# Patient Record
Sex: Male | Born: 1955 | Race: White | Hispanic: No | Marital: Single | Smoking: Former smoker
Health system: Southern US, Community
[De-identification: ages and names within clinical notes are randomized; demographics above are authoritative.]

## PROBLEM LIST (undated history)

## (undated) DIAGNOSIS — J449 Chronic obstructive pulmonary disease, unspecified: Secondary | ICD-10-CM

## (undated) HISTORY — PX: KIDNEY SURGERY: SHX687

---

## 2006-04-25 ENCOUNTER — Other Ambulatory Visit: Payer: Self-pay

## 2006-04-25 ENCOUNTER — Emergency Department: Payer: Self-pay

## 2006-04-27 ENCOUNTER — Other Ambulatory Visit: Payer: Self-pay

## 2006-04-27 ENCOUNTER — Inpatient Hospital Stay: Payer: Self-pay | Admitting: Internal Medicine

## 2006-05-02 ENCOUNTER — Emergency Department: Payer: Self-pay | Admitting: Emergency Medicine

## 2006-05-02 ENCOUNTER — Other Ambulatory Visit: Payer: Self-pay

## 2007-02-25 ENCOUNTER — Emergency Department: Payer: Self-pay | Admitting: Internal Medicine

## 2007-03-25 ENCOUNTER — Other Ambulatory Visit: Payer: Self-pay

## 2007-03-25 ENCOUNTER — Emergency Department: Payer: Self-pay | Admitting: Emergency Medicine

## 2007-12-20 ENCOUNTER — Emergency Department: Payer: Self-pay | Admitting: Internal Medicine

## 2008-04-28 IMAGING — CT CT CHEST W/ CM
1 series · 16 of 33 positions shown, 20 images · IV contrast (APPLIED)
Comparison: none

REASON FOR EXAM: SOB, syncope, tachycardia and hypoxia; [HOSPITAL]
COMMENTS:

PROCEDURE:     CT  - CT CHEST (FOR PE) W  - April 27, 2006  [DATE]
RESULT:
TECHNIQUE: Axial images were obtained from the apices to the hemidiaphragms
post intravenous injection of contrast material.  Both mediastinal and lung
window settings were obtained.

[Series 5: soft tissue · axial · 0.74mm/px · z∈[-221,+58]mm · 16 of 101 slices shown, 20 images]
[im 4/101  mediastinal]
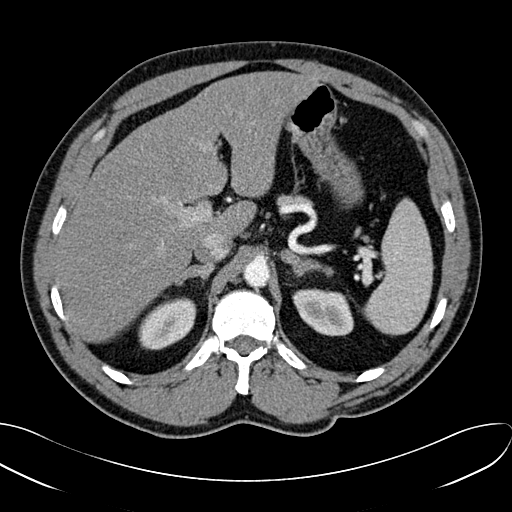
[im 4/101  lung]
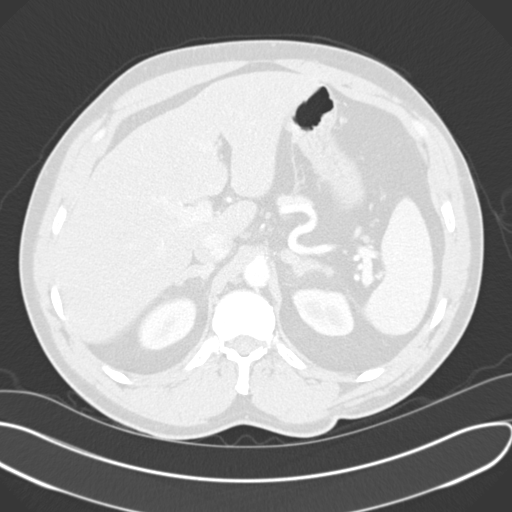
[im 12/101  lung]
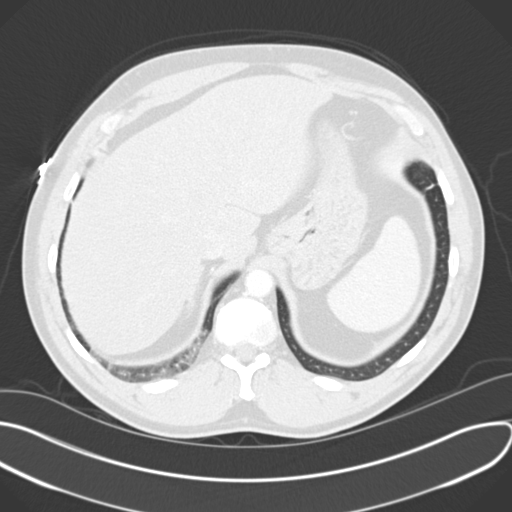
[im 19/101  lung]
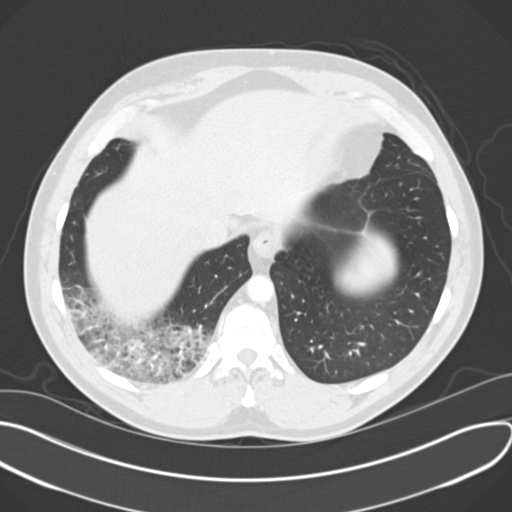
[im 23/101  lung]
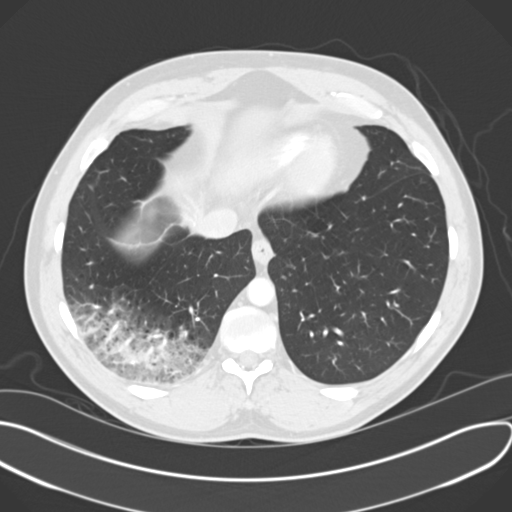
[im 30/101  mediastinal]
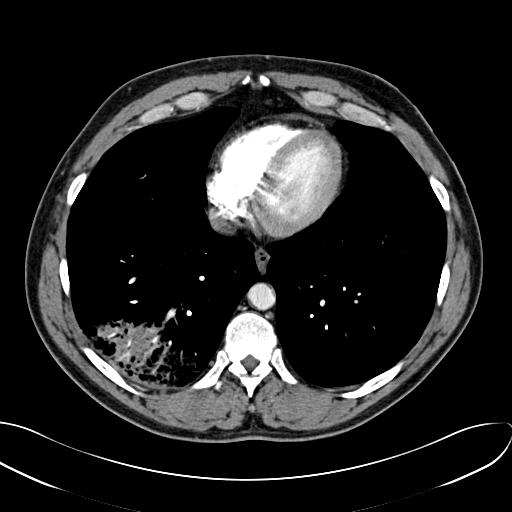
[im 30/101  lung]
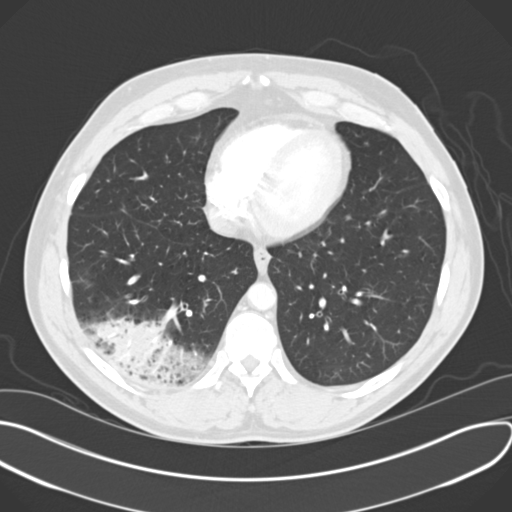
[im 38/101  lung]
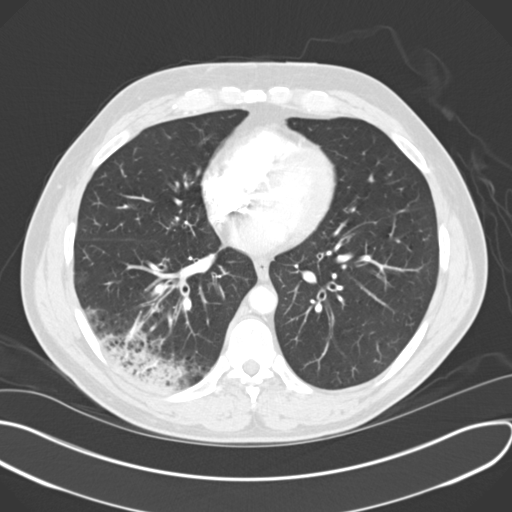
[im 45/101  lung]
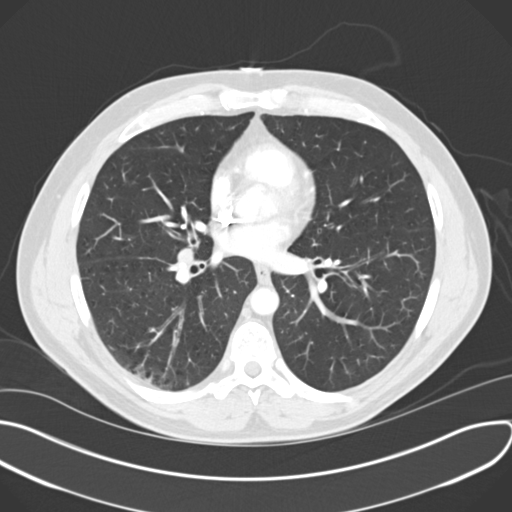
[im 49/101  lung]
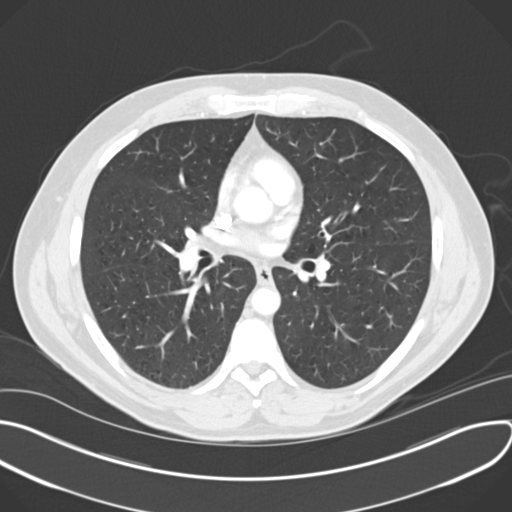
[im 54/101  mediastinal]
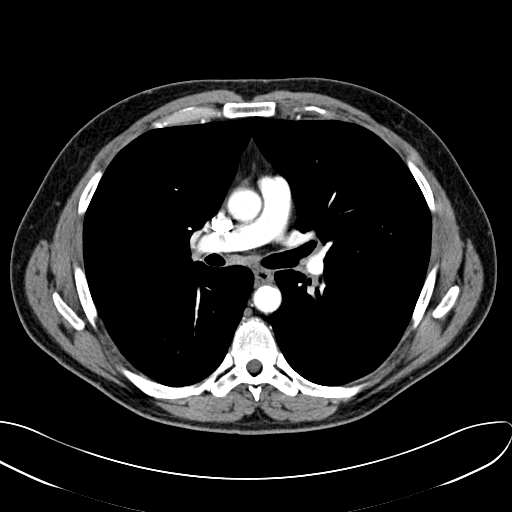
[im 54/101  lung]
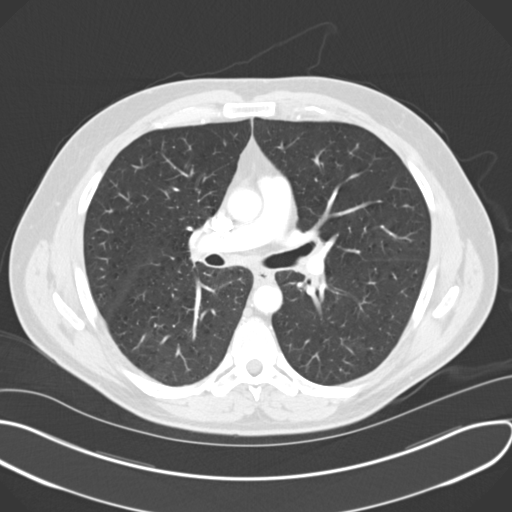
[im 60/101  lung]
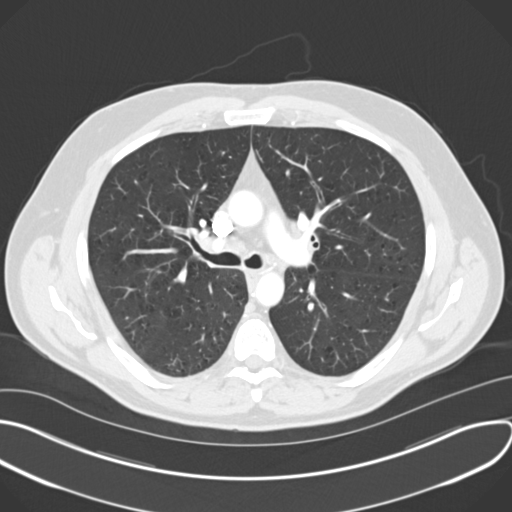
[im 63/101  lung]
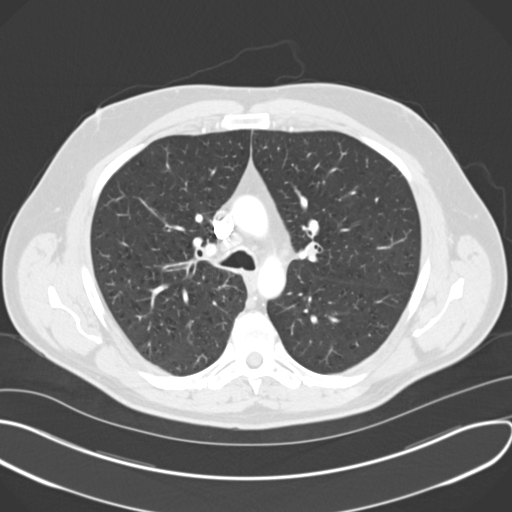
[im 71/101  lung]
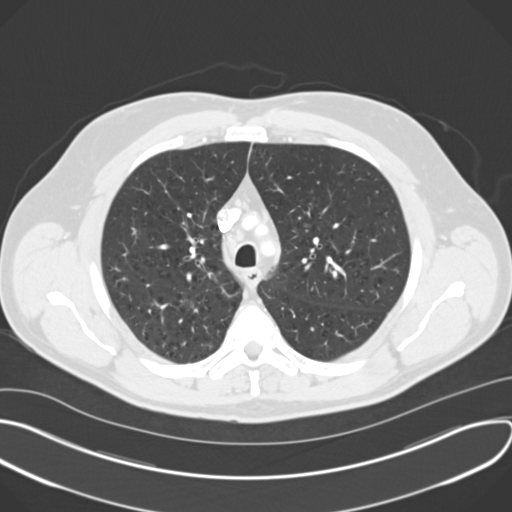
[im 78/101  mediastinal]
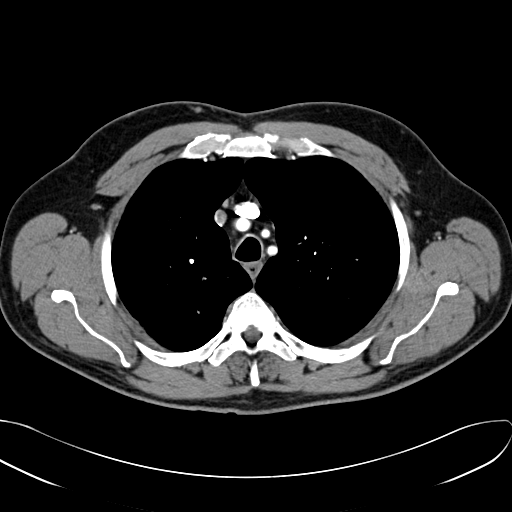
[im 78/101  lung]
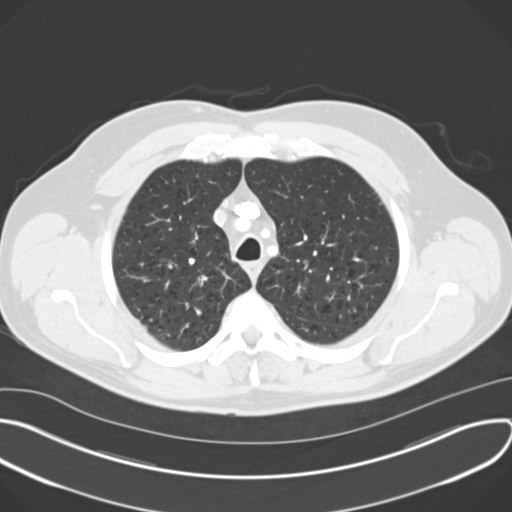
[im 82/101  lung]
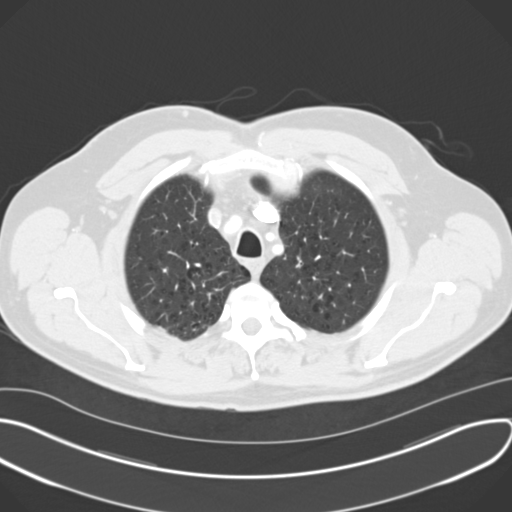
[im 89/101  lung]
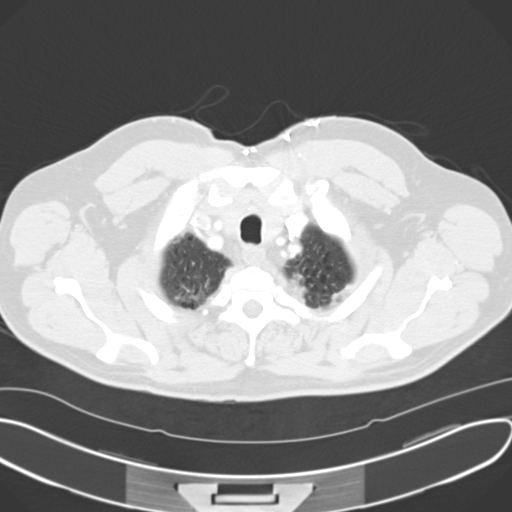
[im 97/101  lung]
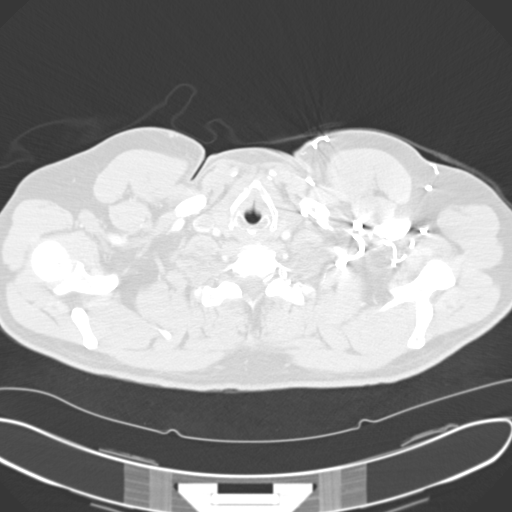

[16 of 33 positions shown; findings below may reference images not displayed]

FINDINGS: On the mediastinal window settings, there is some mediastinal
lymph nodes as well as RIGHT hilar lymph node and lymph node along the
descending artery on the RIGHT.  No filling defects are noted within the
pulmonary artery to suggest pulmonary emboli.

On the lung window settings, there is noted alveolar density with
consolidation in the RIGHT lower lobe posteriorly which can be compatible
with RIGHT lower lobe pneumonia.  No effusions are noted.
IMPRESSION: Findings which can be consistent with a RIGHT lower lobe pneumonia. Some
reactive lymph nodes noted in the RIGHT hilar and infrahilar region.

The report was called to the Emergency Room at the conclusion of the study.

## 2008-11-05 ENCOUNTER — Emergency Department: Payer: Self-pay | Admitting: Emergency Medicine

## 2011-08-30 ENCOUNTER — Emergency Department: Payer: Self-pay | Admitting: *Deleted

## 2012-12-11 ENCOUNTER — Inpatient Hospital Stay: Payer: Self-pay | Admitting: Internal Medicine

## 2012-12-11 LAB — COMPREHENSIVE METABOLIC PANEL
Albumin: 3.5 g/dL (ref 3.4–5.0)
BUN: 9 mg/dL (ref 7–18)
Calcium, Total: 9.5 mg/dL (ref 8.5–10.1)
Creatinine: 0.98 mg/dL (ref 0.60–1.30)
EGFR (African American): >60
Glucose: 158 mg/dL — ABNORMAL HIGH (ref 65–99)
Osmolality: 255 (ref 275–301)
SGOT(AST): 38 U/L — ABNORMAL HIGH (ref 15–37)
Sodium: 126 mmol/L — ABNORMAL LOW (ref 136–145)
Total Protein: 8.4 g/dL — ABNORMAL HIGH (ref 6.4–8.2)

## 2012-12-11 LAB — CBC
HCT: 43.8 % (ref 40.0–52.0)
HGB: 15.4 g/dL (ref 13.0–18.0)
MCH: 31.1 pg (ref 26.0–34.0)
MCHC: 35.2 g/dL (ref 32.0–36.0)
Platelet: 301 10*3/uL (ref 150–440)
RBC: 4.96 10*6/uL (ref 4.40–5.90)
RDW: 13.6 % (ref 11.5–14.5)

## 2012-12-11 LAB — CK TOTAL AND CKMB (NOT AT ARMC): CK-MB: 13.5 ng/mL — ABNORMAL HIGH (ref 0.5–3.6)

## 2012-12-12 LAB — CBC WITH DIFFERENTIAL/PLATELET
Basophil #: 0 10*3/uL (ref 0.0–0.1)
Basophil %: 0.2 %
Eosinophil %: 0 %
HCT: 41.8 % (ref 40.0–52.0)
HGB: 14.6 g/dL (ref 13.0–18.0)
Lymphocyte %: 6 %
MCH: 31.1 pg (ref 26.0–34.0)
MCV: 89 fL (ref 80–100)
Monocyte #: 1 x10 3/mm (ref 0.2–1.0)
Monocyte %: 5.1 %
Neutrophil #: 17.3 10*3/uL — ABNORMAL HIGH (ref 1.4–6.5)
Platelet: 302 10*3/uL (ref 150–440)
RDW: 13.5 % (ref 11.5–14.5)

## 2012-12-12 LAB — BASIC METABOLIC PANEL
Anion Gap: 7 (ref 7–16)
BUN: 19 mg/dL — ABNORMAL HIGH (ref 7–18)
Calcium, Total: 9.9 mg/dL (ref 8.5–10.1)
Chloride: 100 mmol/L (ref 98–107)
Co2: 28 mmol/L (ref 21–32)
Creatinine: 1.14 mg/dL (ref 0.60–1.30)
EGFR (African American): >60
Osmolality: 279 (ref 275–301)
Potassium: 4 mmol/L (ref 3.5–5.1)
Sodium: 135 mmol/L — ABNORMAL LOW (ref 136–145)

## 2012-12-13 LAB — CK: CK, Total: 162 U/L (ref 35–232)

## 2012-12-13 LAB — TROPONIN I: Troponin-I: <0.02 ng/mL

## 2012-12-15 LAB — CBC WITH DIFFERENTIAL/PLATELET
HGB: 14.4 g/dL (ref 13.0–18.0)
Lymphocytes: 11 %
MCH: 30.9 pg (ref 26.0–34.0)
MCHC: 34.1 g/dL (ref 32.0–36.0)
MCV: 91 fL (ref 80–100)
Monocytes: 11 %
Platelet: 333 10*3/uL (ref 150–440)
RBC: 4.67 10*6/uL (ref 4.40–5.90)
Segmented Neutrophils: 77 %
WBC: 18.3 10*3/uL — ABNORMAL HIGH (ref 3.8–10.6)

## 2012-12-16 LAB — CULTURE, BLOOD (SINGLE)

## 2012-12-16 LAB — CREATININE, SERUM
Creatinine: 1.31 mg/dL — ABNORMAL HIGH (ref 0.60–1.30)
EGFR (Non-African Amer.): 60 — ABNORMAL LOW

## 2014-06-26 NOTE — H&P (Signed)
PATIENT NAME:  Dustin Vazquez, Dustin Vazquez MR#:  [id number] DATE OF BIRTH:  May 07, 1955  DATE OF ADMISSION:  12/11/2012  PRIMARY CARE PHYSICIAN: At the [facility].   CHIEF COMPLAINT: Shortness of breath.   HISTORY OF PRESENT ILLNESS: This is a 59 year old male who presents to the hospital due to a 2 to 3 day history of progressive shortness of breath, cough with productive sputum and chills. The patient does have a history of underlying COPD with ongoing tobacco abuse. Presents to the hospital due to a worsening shortness of breath as mentioned. His cough was productive with green-yellow sputum. He admits to some chills, but no documented fever. No nausea, no vomiting. He does admit to some chest pain, but related to cough. The patient presented to the hospital, was noted to be hypoxic and noted to be in acute hypoxic respiratory failure secondary to COPD exacerbation. The patient was initially put on BiPAP, but could not tolerate it; therefore, currently he is on high flow nasal cannula and feels much better. Hospitalist services were contacted for further treatment and evaluation.   REVIEW OF SYSTEMS: CONSTITUTIONAL: No documented fever. Positive chills. No weight gain or weight loss.  EYES: No blurry or double vision.  ENT: No tinnitus. No postnasal drip. No redness of the oropharynx.  RESPIRATORY: Positive cough. Positive COPD, positive wheezing, positive dyspnea on exertion.  CARDIOVASCULAR: Positive chest pain. No orthopnea, no palpitations, no syncope.  GASTROINTESTINAL: No nausea, vomiting, diarrhea. No abdominal pain. No melena or hematochezia.  GENITOURINARY: No dysuria or hematuria.  ENDOCRINE: No polyuria or nocturia. No heat or cold intolerance. HEMATOLOGY:  No anemia. No bruising. No bleeding.  INTEGUMENTARY: No rashes. No lesions.  MUSCULOSKELETAL: No arthritis. No swelling. No gout.  NEUROLOGIC: No numbness or tingling. No ataxia. No seizure activity.  PSYCHIATRIC: No anxiety. No insomnia.  No ADD.   PAST MEDICAL HISTORY: Consistent with COPD with ongoing tobacco abuse,   ALLERGIES: No known drug allergies.   SOCIAL HISTORY: Does smoke about a pack per day, has been smoking for the past 40+ years. Occasional alcohol use with three beers daily. No illicit drug abuse. Lives by himself.   FAMILY HISTORY: Both mother and father are deceased. Mother died from complications of heart disease. Father died from lung disease as he used to work in a mill.    CURRENT MEDICATIONS: Symbicort 80/4.5,  one puff b.i.d., albuterol inhaler as needed.   PHYSICAL EXAMINATION:  VITAL SIGNS: On admission are noted to be: Temperature is 99.5, pulse 124, respirations 28, blood pressure 131/81, sats 95% on 30% high flow nasal cannula.  GENERAL: He is a pleasant-appearing male in mild to moderate respiratory distress.  HEAD, EYES, EARS, NOSE, THROAT:  The patient is atraumatic, normocephalic. Extraocular muscles are intact. Pupils are equal and reactive to light. Sclerae anicteric. No conjunctival injection. No pharyngeal erythema.  NECK: Supple. There is no jugular venous distention. No bruits, no lymphadenopathy or thyromegaly.  HEART: Regular rate and rhythm, tachycardic. No murmurs. No rubs. No clicks.  LUNGS: He has a prolonged inspiratory and expiratory phase, diffuse wheezing bilaterally. Positive use of accessory muscles. No dullness to percussion.  ABDOMEN: Soft, flat, nontender, nondistended. Has good bowel sounds. No hepatosplenomegaly appreciated.  EXTREMITIES: No evidence of any cyanosis, clubbing, or peripheral edema. Has +2 pedal and radial pulses bilaterally.  NEUROLOGICAL: The patient is alert, awake, and oriented x 3. No focal motor or sensory deficits appreciated bilaterally.  SKIN: Moist and warm with no rashes appreciated.  LYMPHATIC: There  is cervical or axillary lymphadenopathy.   LABORATORY DATA: Serum glucose of 158, BUN 9, creatinine 0.9, sodium 126, potassium 3.8, chloride 92,  bicarbonate 26. LFTs are within normal limits. CK  557 . Troponin less than 0.02. White cell count 20.6, hemoglobin 15.4, hematocrit 43.8, platelet count 301.  ABG showed a pH of 7.48, pCO2 of 31, pO2 of 89, sats 97%. The patient did have a chest x-ray done which showed hyperinflation secondary to COPD, but no other acute cardiopulmonary disease of the chest.   ASSESSMENT AND PLAN: This is a 59 year old male with a history of long-term tobacco abuse and chronic obstructive pulmonary disease who presents to the hospital with shortness of breath and wheezing; progressively getting worse over the past 2 to 3 days and noted to be in chronic obstructive pulmonary disease exacerbation.   1.  Chronic obstructive pulmonary disease exacerbation. This is likely secondary to ongoing tobacco abuse with possible underlying bronchitis/pneumonia. For now, I will start the patient on IV steroids, around-the-clock nebulizer treatments, continue his Symbicort, add some Spiriva also place him on empiric Levaquin, follow blood and sputum cultures. We will need to assess him for home oxygen prior to discharge. The patient was on BiPAP originally now is on high flow nasal cannula. Will wean the high flow as tolerated.  2.  Leukocytosis. I suspect this is secondary to the bronchitis/pneumonia. We will follow white cell count after treatment with intravenous with IV antibiotics.  3.  Tobacco abuse. I will place the patient on a nicotine patch.   CODE STATUS: The patient is a FULL CODE.   TIME SPENT: 50 minutes    ____________________________ Rolly PancakeVivek J. Cherlynn KaiserSainani, MD vjs:dp D: 12/11/2012 16:08:25 ET T: 12/11/2012 16:43:58 ET JOB#: [id number]  cc: Rolly PancakeVivek J. Cherlynn KaiserSainani, MD, <Dictator> Houston SirenVIVEK J Tatum Massman MD ELECTRONICALLY SIGNED 12/11/2012 19:20

## 2014-06-26 NOTE — Discharge Summary (Signed)
PATIENT NAME:  Dustin Vazquez, Dustin Vazquez MR#:  [id number] DATE OF BIRTH:  May 30, 1955  DATE OF ADMISSION:  12/11/2012 DATE OF DISCHARGE:  12/16/2012  PRIMARY CARE PHYSICIAN: Nonlocal.   DISCHARGE DIAGNOSIS: 1.  Acute respiratory failure due to chronic obstructive pulmonary disease exacerbation.  2.  Acute bronchitis.  3.  Tobacco abuse.   CONDITION: Stable.   CODE STATUS: Full code.   HOME MEDICATIONS: 1.  Proventil HFA CFC-free 90 mcg inhalation 2 puffs inhaled 4 times a day p.r.n. for shortness of breath.  2.  Symbicort 80 mcg/2.5 mcg inhalation aerosol 2 puffs inhaled twice a day.  3.  Spiriva 18 mcg inhalation capsule 1 cap once a day.  4.  Guaifenesin 100 mg per 5 mL oral liquid, 10 mL every 4 hours p.r.n.  5.  Prednisone 10 mg p.o. oral tablets, 4 tabs p.o. once a day for 2 days then taper.   DIET: Regular diet.   ACTIVITY: As tolerated.   FOLLOWUP CARE: Follow with PCP within 1 to 2 weeks. The patient will need smoking cessation. Smoking cessation was counseled, the patient has a nicotine patch at home.   REASON FOR ADMISSION: Shortness of breath.   HOSPITAL COURSE:   1.  The patient is a 59 year old gentleman with a history of COPD, tobacco abuse, presented to the ED with cough, sputum, shortness of breath. The patient was initially put on BiPAP but could not tolerate, then was on high-flow nasal cannula oxygen. For detailed history and physical examination, please refer to the admission note dictated by Dr. Cherlynn KaiserSainani. The patient's ABG on admission showed pH 7.48, pCO2 of 31, pO2 of 89, O2 saturation 97%. Patient's chest x-ray showed hyperventilation secondary to COPD but no acute cardiopulmonary disease. After admission, the patient has been treated with oxygen by nasal cannula, IV Solu-Medrol, Symbicort, Spiriva and  DuoNebs. The patient continues to have wheezing for the past 4 days, however, the patient's symptoms have much improved today. The patient only has mild cough, mild  wheezing, no shortness of breath.  2.  Leukocytosis which is possibly due to steroid. The patient's white count was 20.6 and decreased to 18.3.   The patient's vital signs are stable. He is clinically stable, will be off oxygen. The patient will be discharged to home today. I discussed the patient's discharge plan with the patient, nurse and case manager.   TIME SPENT: About 36 minutes.   ____________________________ Shaune PollackQing Santo Zahradnik, MD qc:cs Vazquez: 12/16/2012 18:03:00 ET T: 12/16/2012 19:48:25 ET JOB#: [id number]  cc: Shaune PollackQing Daxtyn Rottenberg, MD, <Dictator> Shaune PollackQING Tor Tsuda MD ELECTRONICALLY SIGNED 12/19/2012 10:15

## 2015-05-28 ENCOUNTER — Encounter: Payer: Self-pay | Admitting: *Deleted

## 2015-05-28 ENCOUNTER — Emergency Department
Admission: EM | Admit: 2015-05-28 | Discharge: 2015-05-28 | Discharge status: Against Medical Advice | Payer: Medicaid Other | Attending: Emergency Medicine | Admitting: Emergency Medicine

## 2015-05-28 ENCOUNTER — Emergency Department: Payer: Medicaid Other

## 2015-05-28 DIAGNOSIS — J441 Chronic obstructive pulmonary disease with (acute) exacerbation: Secondary | ICD-10-CM | POA: Diagnosis not present

## 2015-05-28 DIAGNOSIS — M542 Cervicalgia: Secondary | ICD-10-CM | POA: Insufficient documentation

## 2015-05-28 DIAGNOSIS — M25511 Pain in right shoulder: Secondary | ICD-10-CM | POA: Diagnosis not present

## 2015-05-28 DIAGNOSIS — M546 Pain in thoracic spine: Secondary | ICD-10-CM | POA: Diagnosis not present

## 2015-05-28 DIAGNOSIS — R7989 Other specified abnormal findings of blood chemistry: Secondary | ICD-10-CM | POA: Diagnosis not present

## 2015-05-28 DIAGNOSIS — R0602 Shortness of breath: Secondary | ICD-10-CM | POA: Diagnosis present

## 2015-05-28 DIAGNOSIS — R778 Other specified abnormalities of plasma proteins: Secondary | ICD-10-CM

## 2015-05-28 DIAGNOSIS — J471 Bronchiectasis with (acute) exacerbation: Secondary | ICD-10-CM

## 2015-05-28 HISTORY — DX: Chronic obstructive pulmonary disease, unspecified: J44.9

## 2015-05-28 LAB — BASIC METABOLIC PANEL
Anion gap: 11 (ref 5–15)
BUN: 11 mg/dL (ref 6–20)
CALCIUM: 8.8 mg/dL — AB (ref 8.9–10.3)
CO2: 19 mmol/L — AB (ref 22–32)
CREATININE: 0.92 mg/dL (ref 0.61–1.24)
Chloride: 103 mmol/L (ref 101–111)
GFR calc non Af Amer: >60 mL/min (ref >60)
GLUCOSE: 111 mg/dL — AB (ref 65–99)
Potassium: 3.9 mmol/L (ref 3.5–5.1)
Sodium: 133 mmol/L — ABNORMAL LOW (ref 135–145)

## 2015-05-28 LAB — CBC
HCT: 46 % (ref 40.0–52.0)
Hemoglobin: 15.5 g/dL (ref 13.0–18.0)
MCH: 31.5 pg (ref 26.0–34.0)
MCHC: 33.8 g/dL (ref 32.0–36.0)
MCV: 93.1 fL (ref 80.0–100.0)
PLATELETS: 260 10*3/uL (ref 150–440)
RBC: 4.94 MIL/uL (ref 4.40–5.90)
RDW: 13.7 % (ref 11.5–14.5)
WBC: 13.7 10*3/uL — ABNORMAL HIGH (ref 3.8–10.6)

## 2015-05-28 LAB — TROPONIN I: TROPONIN I: 0.08 ng/mL — AB (ref <0.031)

## 2015-05-28 MED ORDER — LEVOFLOXACIN IN D5W 750 MG/150ML IV SOLN: 750.0000 mg | Freq: Once | INTRAVENOUS | Status: DC

## 2015-05-28 MED ORDER — LEVOFLOXACIN 750 MG PO TABS
750.0000 mg | ORAL_TABLET | Freq: Once | ORAL | Status: AC
  Administered 2015-05-28: 750 mg via ORAL
  Filled 2015-05-28: qty 1

## 2015-05-28 MED ORDER — PREDNISONE 10 MG (21) PO TBPK: 10.0000 mg | ORAL_TABLET | Freq: Every day | ORAL | Status: DC

## 2015-05-28 MED ORDER — IOPAMIDOL (ISOVUE-300) INJECTION 61%
75.0000 mL | Freq: Once | INTRAVENOUS | Status: AC | PRN
  Administered 2015-05-28: 75 mL via INTRAVENOUS

## 2015-05-28 MED ORDER — ASPIRIN 81 MG PO CHEW
324.0000 mg | CHEWABLE_TABLET | Freq: Once | ORAL | Status: AC
  Administered 2015-05-28: 324 mg via ORAL
  Filled 2015-05-28: qty 4

## 2015-05-28 MED ORDER — LEVOFLOXACIN 500 MG PO TABS: 500.0000 mg | ORAL_TABLET | Freq: Every day | ORAL | Status: AC

## 2015-05-28 MED ORDER — HYDROCOD POLST-CPM POLST ER 10-8 MG/5ML PO SUER: 5.0000 mL | Freq: Two times a day (BID) | ORAL | Status: DC

## 2015-05-28 NOTE — ED Notes (Signed)
PAtient denies chest pain

## 2015-05-28 NOTE — ED Notes (Addendum)
Pt to ED via POV with cough/congestion, right shoulder and neck pain x 3-4 days. Pt states productive cough, along with SOB. Pt denies chest pain, vitals wnl, talking complete and full sentences. NAD noted.

## 2015-05-28 NOTE — Discharge Instructions (Signed)
Bronchiectasis Bronchiectasis is a condition in which the airways (bronchi) are damaged and widened. This makes it difficult for the lungs to get rid of mucus. As a result, mucus gathers in the airways, and this often leads to lung infections. Infection can cause inflammation in the airways, which may further weaken and damage the bronchi.  CAUSES  Bronchiectasis may be present at birth (congenital) or may develop later in life. Sometimes there is no apparent cause. Some common causes include:  Cystic fibrosis.   Recurrent lung infections (such as pneumonia, tuberculosis, or fungal infections).  Foreign bodies or other blockages in the lungs.  Breathing in fluid, food, or other foreign objects (aspiration). SIGNS AND SYMPTOMS  Common symptoms include:  A daily cough that brings up mucus and lasts for more than 3 weeks.  Frequent lung infections (such as pneumonia, tuberculosis, or fungal infections).  Shortness of breath and wheezing.   Weakness and fatigue. DIAGNOSIS  Various tests may be done to help diagnose bronchiectasis. Tests may include:  Chest X-rays or CT scans.   Breathing tests to help determine how your lungs are working.   Sputum cultures to check for infection.   Blood tests and other tests to check for related diseases or causes, such as cystic fibrosis. TREATMENT  Treatment varies depending on the severity of the condition. Medicines may be given to loosen the mucus to be coughed up (expectorants), to relax the muscles of the air passages (bronchodilators), or to prevent or treat infections (antibiotics). Physical therapy methods may be recommended to help clear mucus from the lungs. For severe cases, surgery may be done to remove the affected part of the lung. HOME CARE INSTRUCTIONS   Get plenty of rest.   Only take over-the-counter or prescription medicines as directed by your health care provider. If antibiotic medicines were prescribed, take them as  directed. Finish them even if you start to feel better.  Avoid sedatives and antihistamines unless otherwise directed by your health care provider. These medicines tend to thicken the mucus in the lungs.   Perform any breathing exercises or techniques to clear the lungs as directed by your health care provider.  Drink enough fluids to keep your urine clear or pale yellow.  Consider using a cold steam vaporizer or humidifier in your room or home to help loosen secretions.   If the cough is worse at night, try sleeping in a semi-upright position in a recliner or using a couple of pillows.   Avoid cigarette smoke and lung irritants. If you smoke, quit.  Stay inside when pollution and ozone levels are high.   Stay current with vaccinations and immunizations.   Follow up with your health care provider as directed.  SEEK MEDICAL CARE IF:  You cough up more thick, discolored mucus (sputum) that is yellow to green in color.  You have a fever or persistent symptoms for more than 2-3 days.  You cannot control your cough and are losing sleep. SEEK IMMEDIATE MEDICAL CARE IF:   You cough up blood.   You have chest pain or increasing shortness of breath.   You have pain that is getting worse or is uncontrolled with medicines.   You have a fever and your symptoms suddenly get worse. MAKE SURE YOU:  Understand these instructions.   Will watch your condition.   Will get help right away if you are not doing well or get worse.    This information is not intended to replace advice given to   you by your health care provider. Make sure you discuss any questions you have with your health care provider.   Document Released: 12/18/2006 Document Revised: 02/25/2013 Document Reviewed: 08/28/2012 Elsevier Interactive Patient Education 2016 [employer]  Chronic Obstructive Pulmonary Disease Exacerbation Chronic obstructive pulmonary disease (COPD) is a common lung condition in  which airflow from the lungs is limited. COPD is a general term that can be used to describe many different lung problems that limit airflow, including chronic bronchitis and emphysema. COPD exacerbations are episodes when breathing symptoms become much worse and require extra treatment. Without treatment, COPD exacerbations can be life threatening, and frequent COPD exacerbations can cause further damage to your lungs. CAUSES  Respiratory infections.  Exposure to smoke.  Exposure to air pollution, chemical fumes, or dust. Sometimes there is no apparent cause or trigger. RISK FACTORS  Smoking cigarettes.  Older age.  Frequent prior COPD exacerbations. SIGNS AND SYMPTOMS  Increased coughing.  Increased thick spit (sputum) production.  Increased wheezing.  Increased shortness of breath.  Rapid breathing.  Chest tightness. DIAGNOSIS Your medical history, a physical exam, and tests will help your health care provider make a diagnosis. Tests may include:  A chest X-ray.  Basic lab tests.  Sputum testing.  An arterial blood gas test. TREATMENT Depending on the severity of your COPD exacerbation, you may need to be admitted to a hospital for treatment. Some of the treatments commonly used to treat COPD exacerbations are:   Antibiotic medicines.  Bronchodilators. These are drugs that expand the air passages. They may be given with an inhaler or nebulizer. Spacer devices may be needed to help improve drug delivery.  Corticosteroid medicines.  Supplemental oxygen therapy.  Airway clearing techniques, such as noninvasive ventilation (NIV) and positive expiratory pressure (PEP). These provide respiratory support through a mask or other noninvasive device. HOME CARE INSTRUCTIONS  Do not smoke. Quitting smoking is very important to prevent COPD from getting worse and exacerbations from happening as often.  Avoid exposure to all substances that irritate the airway, especially  to tobacco smoke.  If you were prescribed an antibiotic medicine, finish it all even if you start to feel better.  Take all medicines as directed by your health care provider.It is important to use correct technique with inhaled medicines.  Drink enough fluids to keep your urine clear or pale yellow (unless you have a medical condition that requires fluid restriction).  Use a cool mist vaporizer. This makes it easier to clear your chest when you cough.  If you have a home nebulizer and oxygen, continue to use them as directed.  Maintain all necessary vaccinations to prevent infections.  Exercise regularly.  Eat a healthy diet.  Keep all follow-up appointments as directed by your health care provider. SEEK IMMEDIATE MEDICAL CARE IF:  You have worsening shortness of breath.  You have trouble talking.  You have severe chest pain.  You have blood in your sputum.  You have a fever.  You have weakness, vomit repeatedly, or faint.  You feel confused.  You continue to get worse. MAKE SURE YOU:  Understand these instructions.  Will watch your condition.  Will get help right away if you are not doing well or get worse.   This information is not intended to replace advice given to you by your health care provider. Make sure you discuss any questions you have with your health care provider.   Document Released: 12/18/2006 Document Revised: 03/13/2014 Document Reviewed: 10/25/2012 Elsevier Interactive Patient  Education ©2016 [employer] ° °

## 2015-05-28 NOTE — ED Provider Notes (Addendum)
[facility] Emergency Department Provider Note     Time seen: ----------------------------------------- 12:37 PM on 05/28/2015 -----------------------------------------    I have reviewed the triage vital signs and the nursing notes.   HISTORY  Chief Complaint Shortness of Breath; Cough; and Shoulder Pain    HPI Dustin Vazquez is a 60 y.o. male who presents to ER with cough congestion with right shoulder and neck pain for last 3-4 days. Patient states he's had a productive cough along with shortness of breath. Patient states does have COPD and feels worse than normal. Patient is taking his inhaler at home without any improvement.Nothing makes his symptoms better.   Past Medical History  Diagnosis Date  . COPD (chronic obstructive pulmonary disease) (HCC)     There are no active problems to display for this patient.   History reviewed. No pertinent past surgical history.  Allergies Review of patient's allergies indicates no known allergies.  Social History Social History  Substance Use Topics  . Smoking status: Never Smoker   . Smokeless tobacco: None  . Alcohol Use: No    Review of Systems Constitutional: Negative for fever. Eyes: Negative for visual changes. ENT: Negative for sore throat. Cardiovascular: Positive for right upper chest pain Respiratory: Positive shortness of breath and cough Gastrointestinal: Negative for abdominal pain, vomiting and diarrhea. Genitourinary: Negative for dysuria. Musculoskeletal: Positive for right upper back and neck pain Skin: Negative for rash. Neurological: Negative for headaches, focal weakness or numbness.  10-point ROS otherwise negative.  ____________________________________________   PHYSICAL EXAM:  VITAL SIGNS: ED Triage Vitals  Enc Vitals Group     BP 05/28/15 1147 158/107 mmHg     Pulse Rate 05/28/15 1147 103     Resp 05/28/15 1147 20     Temp 05/28/15 1147 97.8 F (36.6 C)      Temp Source 05/28/15 1147 Oral     SpO2 05/28/15 1147 96 %     Weight 05/28/15 1147 160 lb (72.576 kg)     Height 05/28/15 1147  (1.676 m)     Head Cir --      Peak Flow --      Pain Score 05/28/15 1154 10     Pain Loc --      Pain Edu? --      Excl. in GC? --     Constitutional: Alert and oriented. Mild distress Eyes: Conjunctivae are normal. PERRL. Normal extraocular movements. ENT   Head: Normocephalic and atraumatic.   Nose: No congestion/rhinnorhea.   Mouth/Throat: Mucous membranes are moist.   Neck: No stridor. Cardiovascular: Normal rate, regular rhythm. Normal and symmetric distal pulses are present in all extremities. No murmurs, rubs, or gallops. Respiratory: Tachypnea with prolonged expirations and wheezing bilaterally. Gastrointestinal: Soft and nontender. No distention. No abdominal bruits.  Musculoskeletal: Nontender with normal range of motion in all extremities. No joint effusions.  No lower extremity tenderness nor edema. Neurologic:  Normal speech and language. No gross focal neurologic deficits are appreciated.  Skin:  Skin is warm, dry and intact. No rash noted. Psychiatric: Mood and affect are normal. Speech and behavior are normal. Patient exhibits appropriate insight and judgment. ____________________________________________  EKG: Interpreted by me. Normal sinus rhythm with a rate of 100 bpm, normal PR interval, normal QRS, normal QT interval. Normal axis. No evidence of acute infarction.  ____________________________________________  ED COURSE:  Pertinent labs & imaging results that were available during my care of the patient were reviewed by me and considered  in my medical decision making (see chart for details). She is in mild distress from COPD. Possible pneumonia as well, we'll obtain basic x-rays and labs, give IV steroids and DuoNeb. ____________________________________________    LABS (pertinent positives/negatives)  Labs  Reviewed  BASIC METABOLIC PANEL - Abnormal; Notable for the following:    Sodium 133 (*)    CO2 19 (*)    Glucose, Bld 111 (*)    Calcium 8.8 (*)    All other components within normal limits  CBC - Abnormal; Notable for the following:    WBC 13.7 (*)    All other components within normal limits  TROPONIN I - Abnormal; Notable for the following:    Troponin I 0.08 (*)    All other components within normal limits    RADIOLOGY Images were viewed by me  Chest x-ray IMPRESSION: Somewhat geometric appearing area of increased density in the right upper lobe. CT of the chest is recommended for further evaluation.  MPRESSION: 1. Scar/architectural distortion associated with bronchiectasis involving the right upper lobe extending from the apical pleural centrally, accounting for the area of concern on chest x-ray earlier today. 2. 5 mm nodule deep in the posterior left lower lobe. Non-contrast chest CT can be considered in 12 months if patient is high-risk. This recommendation follows the consensus statement: Guidelines for Management of Incidental Pulmonary Nodules Detected on CT Images: From the Fleischner Society 2017; published online before print (10.1148/[id number]). 3. COPD/emphysema. Calcified granuloma in the deep left lower lobe inferior to the above for reference noncalcified nodule. 4. Diffuse steatosis involving the visualized liver. 5. Stable approximate 2 cm left adrenal adenoma. 6. Very small hiatal hernia. Thickening of the distal esophagus at and just above the EG junction may be due to GE reflux disease. 7. Scarring involving the upper pole of the visualized left kidney.  ____________________________________________  FINAL ASSESSMENT AND PLAN  COPD exacerbation, right upper lobe bronchiectasis, elevated troponin  Plan: Patient with labs and imaging as dictated above. Patient be started on nebs and steroids as well as given IV Levaquin. His troponin is  elevated which is likely demand related. This will need to be rechecked. His EKG does not reveal any acute process. He was given aspirin for the elevated troponin. Patient states he absolutely cannot stay in the hospital, he is leaving against my advice. I have advised him of the risks and benefits and advised him he likely had a heart attack from his COPD flareup.   Emily FilbertWilliams, Angelin Cutrone E, MD   Emily FilbertJonathan E Ruchi Stoney, MD 05/28/15 1409  Emily FilbertJonathan E Piedad Standiford, MD 05/28/15 1413  Emily FilbertJonathan E Spyridon Hornstein, MD 05/28/15 [phone]

## 2015-07-26 ENCOUNTER — Emergency Department
Admission: EM | Admit: 2015-07-26 | Discharge: 2015-07-27 | Disposition: A | Discharge status: Home / Self Care | Payer: Medicaid Other | Attending: Emergency Medicine | Admitting: Emergency Medicine

## 2015-07-26 DIAGNOSIS — X58XXXA Exposure to other specified factors, initial encounter: Secondary | ICD-10-CM | POA: Insufficient documentation

## 2015-07-26 DIAGNOSIS — J449 Chronic obstructive pulmonary disease, unspecified: Secondary | ICD-10-CM | POA: Insufficient documentation

## 2015-07-26 DIAGNOSIS — Y939 Activity, unspecified: Secondary | ICD-10-CM | POA: Insufficient documentation

## 2015-07-26 DIAGNOSIS — K222 Esophageal obstruction: Secondary | ICD-10-CM

## 2015-07-26 DIAGNOSIS — T18128A Food in esophagus causing other injury, initial encounter: Secondary | ICD-10-CM

## 2015-07-26 DIAGNOSIS — T18120A Food in esophagus causing compression of trachea, initial encounter: Secondary | ICD-10-CM | POA: Diagnosis not present

## 2015-07-26 DIAGNOSIS — Y999 Unspecified external cause status: Secondary | ICD-10-CM | POA: Diagnosis not present

## 2015-07-26 DIAGNOSIS — Y929 Unspecified place or not applicable: Secondary | ICD-10-CM | POA: Insufficient documentation

## 2015-07-26 NOTE — ED Provider Notes (Signed)
[facility] Emergency Department Provider Note  ____________________________________________  Time seen: Approximately 11:37 PM  I have reviewed the triage vital signs and the nursing notes.   HISTORY  Chief Complaint Foreign Body    HPI Dustin Vazquez is a 60 y.o. male who is very disheveled and dirty but alert, oriented, and politewho presents with report of getting a piece of beef stuck in his throat.  He was eating a beef about 5 or 6 hours ago when he felt a chunk of it get lodged in his throat.  He was unable to swallow or cough it up.  He states that this happened one other time when he was eating fish but he was able to drink some milk and it passed down.  He tried drinking milk over the last couple of hours reports every time he comes back up.  He is holding an emesis bag and spitting his secretions into the bag because he feels that he cannot swallow.  He is to a point in the middle of his neck and states that he can feel the beef there.  He is speaking without any difficulty and he has no difficulty breathing.  He denies chest pain, shortness of breath, abdominal pain.  He reports that he is vomiting but he actually clarified that he is spitting up after he tries to drink something.  He said the onset of the symptoms was acute and the sensation is severe given that he cannot swallow his secretions.  Nothing is making it better and trying to eat or drink is making it worse.  He reports a history of emphysema but denies any other chronic medical problems.   Past Medical History  Diagnosis Date  . COPD (chronic obstructive pulmonary disease) (HCC)     There are no active problems to display for this patient.   No past surgical history on file.  Current Outpatient Rx  Name  Route  Sig  Dispense  Refill  . chlorpheniramine-HYDROcodone (TUSSIONEX PENNKINETIC ER) 10-8 MG/5ML SUER   Oral   Take 5 mLs by mouth 2 (two) times daily.   140 mL   0     . predniSONE (STERAPRED UNI-PAK 21 TAB) 10 MG (21) TBPK tablet   Oral   Take 1 tablet (10 mg total) by mouth daily.   21 tablet   0     Allergies Review of patient's allergies indicates no known allergies.  No family history on file.  Social History Social History  Substance Use Topics  . Smoking status: Never Smoker   . Smokeless tobacco: Not on file  . Alcohol Use: No    Review of Systems Constitutional: No fever/chills Eyes: No visual changes. ENT: No sore throat. Cardiovascular: Denies chest pain. Respiratory: Denies shortness of breath. Gastrointestinal: No abdominal pain.  Food bolus stuck in esophagus, unable to swallow secretions. Genitourinary: Negative for dysuria. Musculoskeletal: Negative for back pain. Skin: Negative for rash. Neurological: Negative for headaches, focal weakness or numbness.  10-point ROS otherwise negative.  ____________________________________________   PHYSICAL EXAM:  VITAL SIGNS: ED Triage Vitals  Enc Vitals Group     BP 07/26/15 2238 136/103 mmHg     Pulse Rate 07/26/15 2238 94     Resp 07/26/15 2238 20     Temp 07/26/15 2238 98 F (36.7 C)     Temp Source 07/26/15 2238 Oral     SpO2 07/26/15 2238 100 %     Weight 07/26/15 2238 180  lb (81.647 kg)     Height 07/26/15 2238  (1.676 m)     Head Cir --      Peak Flow --      Pain Score 07/26/15 2239 8     Pain Loc --      Pain Edu? --      Excl. in GC? --     Constitutional: Alert and oriented. I will distress, spitting into an emesis bag.  Extremely dirty and disheveled  Eyes: Conjunctivae are normal. PERRL. EOMI. Head: Atraumatic. Nose: No congestion/rhinnorhea. Mouth/Throat: Mucous membranes are moist.  Oropharynx non-erythematous. Neck: No stridor.  No meningeal signs.  No palpable masses. Cardiovascular: Normal rate, regular rhythm. Good peripheral circulation. Grossly normal heart sounds.   Respiratory: Normal respiratory effort.  No retractions. Lungs  CTAB. Gastrointestinal: Soft and nontender. No distention.  Musculoskeletal: No lower extremity tenderness nor edema. No gross deformities of extremities. Neurologic:  Normal speech and language. No gross focal neurologic deficits are appreciated.  Skin:  Skin is warm, dry and intact. No rash noted. Psychiatric: Mood and affect are normal. Speech and behavior are normal.  ____________________________________________   LABS (all labs ordered are listed, but only abnormal results are displayed)  Labs Reviewed - No data to display ____________________________________________  EKG  None ____________________________________________  RADIOLOGY   No results found.  ____________________________________________   PROCEDURES  Procedure(s) performed: None  Critical Care performed: No ____________________________________________   INITIAL IMPRESSION / ASSESSMENT AND PLAN / ED COURSE  Pertinent labs & imaging results that were available during my care of the patient were reviewed by me and considered in my medical decision making (see chart for details).  We will try conservative measures initially with having him sit on a carbonated beverage and then proceed with Zofran and glucagon 1 mg IV if the beverage is not successful.  I explained to him that he may need endoscopy to remove the food bolus but he is worried about his dog and wants to go home as soon as possible.  We will try conservative measures initially   (Note that documentation was delayed due to multiple ED patients requiring immediate care.)   The patient was gargling warm soda when he felt the meat bolus pass down.  He is completely asymptomatic and ready to walk out the door.  I quickly printed discharge instructions and given to him as he was walking out.  He feels much better and waved happily on his way out the door. ____________________________________________  FINAL CLINICAL IMPRESSION(S) / ED  DIAGNOSES  Final diagnoses:  Esophageal obstruction due to food impaction     MEDICATIONS GIVEN DURING THIS VISIT:  Medications - No data to display   NEW OUTPATIENT MEDICATIONS STARTED DURING THIS VISIT:  Discharge Medication List as of 07/27/2015 12:06 AM        Note:  This document was prepared using Dragon voice recognition software and may include unintentional dictation errors.   Loleta Rose, MD 07/27/15 [phone]

## 2015-07-26 NOTE — ED Notes (Signed)
Pt states he was eating meat and feels like food is lodged in throat.  Unable to swallow food or water since and feels shob, no hx of the same.

## 2015-07-26 NOTE — ED Notes (Signed)
Pt in via triage; pt reports getting beef lodged in his throat while eating dinner tonight.  Pt reports feeling like he is unable to swallow.  Pt dry heaving, spitting up phlegm.  Pt able to speak in full sentences, no immediate distress at this time.

## 2015-07-27 NOTE — ED Notes (Signed)
Warm coke given to pt. Relief from FO, per pt.

## 2016-12-04 ENCOUNTER — Emergency Department: Payer: Medicaid Other

## 2016-12-04 ENCOUNTER — Emergency Department
Admission: EM | Admit: 2016-12-04 | Discharge: 2016-12-05 | Payer: Medicaid Other | Attending: Emergency Medicine | Admitting: Emergency Medicine

## 2016-12-04 DIAGNOSIS — R079 Chest pain, unspecified: Secondary | ICD-10-CM | POA: Diagnosis present

## 2016-12-04 DIAGNOSIS — R0602 Shortness of breath: Secondary | ICD-10-CM | POA: Diagnosis not present

## 2016-12-04 DIAGNOSIS — J95811 Postprocedural pneumothorax: Secondary | ICD-10-CM | POA: Insufficient documentation

## 2016-12-04 DIAGNOSIS — J449 Chronic obstructive pulmonary disease, unspecified: Secondary | ICD-10-CM | POA: Insufficient documentation

## 2016-12-04 LAB — BASIC METABOLIC PANEL
Anion gap: 10 (ref 5–15)
BUN: 7 mg/dL (ref 6–20)
CHLORIDE: 102 mmol/L (ref 101–111)
CO2: 21 mmol/L — AB (ref 22–32)
Calcium: 8.8 mg/dL — ABNORMAL LOW (ref 8.9–10.3)
Creatinine, Ser: 0.85 mg/dL (ref 0.61–1.24)
GFR calc Af Amer: >60 mL/min (ref >60)
GFR calc non Af Amer: >60 mL/min (ref >60)
Glucose, Bld: 127 mg/dL — ABNORMAL HIGH (ref 65–99)
POTASSIUM: 4 mmol/L (ref 3.5–5.1)
SODIUM: 133 mmol/L — AB (ref 135–145)

## 2016-12-04 LAB — CBC
HEMATOCRIT: 45.1 % (ref 40.0–52.0)
Hemoglobin: 15.4 g/dL (ref 13.0–18.0)
MCH: 32.3 pg (ref 26.0–34.0)
MCHC: 34.2 g/dL (ref 32.0–36.0)
MCV: 94.5 fL (ref 80.0–100.0)
Platelets: 163 10*3/uL (ref 150–440)
RBC: 4.78 MIL/uL (ref 4.40–5.90)
RDW: 13.5 % (ref 11.5–14.5)
WBC: 7.9 10*3/uL (ref 3.8–10.6)

## 2016-12-04 LAB — TROPONIN I: Troponin I: <0.03 ng/mL (ref <0.03)

## 2016-12-04 MED ORDER — FENTANYL CITRATE (PF) 100 MCG/2ML IJ SOLN
50.0000 ug | Freq: Once | INTRAMUSCULAR | Status: AC
  Administered 2016-12-04: 50 ug via INTRAVENOUS
  Filled 2016-12-04: qty 2

## 2016-12-04 MED ORDER — IOPAMIDOL (ISOVUE-370) INJECTION 76%
75.0000 mL | Freq: Once | INTRAVENOUS | Status: AC | PRN
  Administered 2016-12-04: 75 mL via INTRAVENOUS

## 2016-12-04 NOTE — ED Provider Notes (Signed)
[facility] Emergency Department Provider Note  Time seen: 9:31 PM  I have reviewed the triage vital signs and the nursing notes.   HISTORY  Chief Complaint Chest Pain and Shortness of Breath    HPI Dustin Vazquez is a 61 y.o. male With a past medical history of COPD presents the emergency department for left-sided chest pain. According to the patient he had a lung biopsy performed around lunchtime today at the [facility], percutaneously through the left back. Patient states he was feeling well approximately 1 or 2 hours after the biopsy but then developed left-sided chest pain worse with deep inspiration. Patient states the chest pain developed around 4 PM continues to have moderate chest discomfort currently worse with deep inspiration or cough. Denies any increased recent cough. Denies any fever, nausea, vomiting, abdominal pain.  Past Medical History:  Diagnosis Date  . COPD (chronic obstructive pulmonary disease) (HCC)     There are no active problems to display for this patient.   History reviewed. No pertinent surgical history.  Prior to Admission medications   Medication Sig Start Date End Date Taking? Authorizing Provider  chlorpheniramine-HYDROcodone (TUSSIONEX PENNKINETIC ER) 10-8 MG/5ML SUER Take 5 mLs by mouth 2 (two) times daily. 05/28/15   Emily Filbert, MD  predniSONE (STERAPRED UNI-PAK 21 TAB) 10 MG (21) TBPK tablet Take 1 tablet (10 mg total) by mouth daily. 05/28/15   Emily Filbert, MD    No Known Allergies  No family history on file.  Social History Social History  Substance Use Topics  . Smoking status: Never Smoker  . Smokeless tobacco: Not on file  . Alcohol use Yes    Review of Systems Constitutional: Negative for fever. Cardiovascular: left-sided chest pain, worse with deep inspiration Respiratory: Negative for shortness of breath. Gastrointestinal: Negative for abdominal pain Musculoskeletal: Negative  for back pain. Neurological: Negative for headaches All other ROS negative  ____________________________________________   PHYSICAL EXAM:  VITAL SIGNS: ED Triage Vitals  Enc Vitals Group     BP 12/04/16 1823 122/80     Pulse Rate 12/04/16 1823 60     Resp 12/04/16 1823 (!) 22     Temp --      Temp src --      SpO2 12/04/16 1823 92 %     Weight 12/04/16 1823 180 lb (81.6 kg)     Height --      Head Circumference --      Peak Flow --      Pain Score 12/04/16 1822 10     Pain Loc --      Pain Edu? --      Excl. in GC? --     Constitutional: Alert and oriented. Well appearing and in no distress. Eyes: Normal exam ENT   Head: Normocephalic and atraumatic   Mouth/Throat: Mucous membranes are moist. Cardiovascular: Normal rate, regular rhythm. No murmur Respiratory: Normal respiratory effort without tachypnea nor retractions. Breath sounds are clear  Gastrointestinal: Soft and nontender. No distention. Musculoskeletal: Nontender with normal range of motion in all extremities. No lower extremity tenderness or edema. Neurologic:  Normal speech and language. No gross focal neurologic deficits Skin:  Skin is warm, dry and intact.  Psychiatric: Mood and affect are normal.   ____________________________________________    EKG  EKG reviewed and interpreted by myself shows normal sinus rhythm at 90 bpm, narrow QRS, normal axis, normal intervals, no concerning ST changes.  ____________________________________________    RADIOLOGY  chest x-ray negative  CT consistent with small pneumothorax  ____________________________________________   INITIAL IMPRESSION / ASSESSMENT AND PLAN / ED COURSE  Pertinent labs & imaging results that were available during my care of the patient were reviewed by me and considered in my medical decision making (see chart for details).  patient presents to the emergency department for left-sided chest pain worse with deep inspiration.  Differential this time would include ACS, pneumothorax due to biopsy, pulmonary embolism, pneumonia. Labs are largely within normal limits, reassuringly the EKG appears normal and the troponin is negative. Chest x-ray is read as negative at this time. Given the patient's pleuritic chest pain and recent biopsy we will obtain a CT scan of the chest with contrast. Patient agreeable to this plan.  CT scan consistent with small pneumothorax. As the patient had a biopsy performed today with pleuritic chest pain and a pneumothorax on CT [facility]  CT consistent with small pneumothorax. As the patient had the biopsy performed today I believe the patient warns overnight admission to make sure there is no enlargement of the pneumothorax. Patient has all his care at the [facility] we will attempt transfer to the [facility].  ____________________________________________   FINAL CLINICAL IMPRESSION(S) / ED DIAGNOSES  left chest pain pneumothrorax   Minna Antis, MD 12/04/16 2245

## 2016-12-04 NOTE — ED Notes (Signed)
Pt given crackers and peanut butter. Pt drinking a coke. Sitting on stretcher, hooked up to cardiac monitor. Watching television.

## 2016-12-04 NOTE — ED Notes (Signed)
Pt states lung biopsy done today at [place]. States was on way home and started having L sided CP that goes to neck. Pt states it constantly hurts. Doesn't say it hurts with deep breath. States bandage on back. Pt states emphysema. Pt is talking in complete sentences. No distress noted.

## 2016-12-04 NOTE — ED Triage Notes (Signed)
Pt c/o left sided chest pain that "hurts" and SOB. Started at approx 1630 today. Pt had lung biopsy on that side at the [facility] hospital today. Pt able to answer questions in complete sentences. Color WNL. RR even.

## 2016-12-04 NOTE — ED Notes (Signed)
Patient transported to CT 

## 2016-12-05 MED ORDER — FENTANYL CITRATE (PF) 100 MCG/2ML IJ SOLN
50.0000 ug | Freq: Once | INTRAMUSCULAR | Status: AC
  Administered 2016-12-05: 50 ug via INTRAVENOUS
  Filled 2016-12-05: qty 2

## 2016-12-05 NOTE — ED Notes (Signed)
EMTALA checked for completion  

## 2016-12-05 NOTE — ED Provider Notes (Signed)
-----------------------------------------   12:15 AM on 12/05/2016 -----------------------------------------  Patient accepted by Dr. Otho Ket from [place]. Transport called. Patient updated.   Irean Hong, MD 12/05/16 [phone]

## 2016-12-05 NOTE — ED Notes (Signed)
Hardcopy of E-signature in pt chart. E-signature pad wasn't working in pt room

## 2016-12-23 ENCOUNTER — Emergency Department: Payer: Medicaid Other

## 2016-12-23 ENCOUNTER — Emergency Department
Admission: EM | Admit: 2016-12-23 | Discharge: 2016-12-23 | Disposition: A | Discharge status: Home / Self Care | Payer: Medicaid Other | Attending: Emergency Medicine | Admitting: Emergency Medicine

## 2016-12-23 DIAGNOSIS — R079 Chest pain, unspecified: Secondary | ICD-10-CM | POA: Diagnosis not present

## 2016-12-23 DIAGNOSIS — J441 Chronic obstructive pulmonary disease with (acute) exacerbation: Secondary | ICD-10-CM | POA: Diagnosis not present

## 2016-12-23 DIAGNOSIS — R0789 Other chest pain: Secondary | ICD-10-CM | POA: Diagnosis present

## 2016-12-23 DIAGNOSIS — Z79899 Other long term (current) drug therapy: Secondary | ICD-10-CM | POA: Diagnosis not present

## 2016-12-23 LAB — BASIC METABOLIC PANEL
Anion gap: 14 (ref 5–15)
BUN: 7 mg/dL (ref 6–20)
CHLORIDE: 100 mmol/L — AB (ref 101–111)
CO2: 23 mmol/L (ref 22–32)
CREATININE: 0.63 mg/dL (ref 0.61–1.24)
Calcium: 9.3 mg/dL (ref 8.9–10.3)
GFR calc Af Amer: >60 mL/min (ref >60)
GFR calc non Af Amer: >60 mL/min (ref >60)
GLUCOSE: 107 mg/dL — AB (ref 65–99)
POTASSIUM: 3.9 mmol/L (ref 3.5–5.1)
SODIUM: 137 mmol/L (ref 135–145)

## 2016-12-23 LAB — CBC
HEMATOCRIT: 47.7 % (ref 40.0–52.0)
Hemoglobin: 16.2 g/dL (ref 13.0–18.0)
MCH: 32.3 pg (ref 26.0–34.0)
MCHC: 34 g/dL (ref 32.0–36.0)
MCV: 95 fL (ref 80.0–100.0)
PLATELETS: 119 10*3/uL — AB (ref 150–440)
RBC: 5.02 MIL/uL (ref 4.40–5.90)
RDW: 13.7 % (ref 11.5–14.5)
WBC: 5.7 10*3/uL (ref 3.8–10.6)

## 2016-12-23 LAB — TROPONIN I: Troponin I: <0.03 ng/mL (ref <0.03)

## 2016-12-23 MED ORDER — ACETAMINOPHEN 500 MG PO TABS
1000.0000 mg | ORAL_TABLET | Freq: Once | ORAL | Status: AC
  Administered 2016-12-23: 1000 mg via ORAL
  Filled 2016-12-23: qty 2

## 2016-12-23 MED ORDER — LIDOCAINE 5 % EX PTCH
1.0000 | MEDICATED_PATCH | CUTANEOUS | Status: DC
  Administered 2016-12-23: 1 via TRANSDERMAL
  Filled 2016-12-23: qty 1

## 2016-12-23 MED ORDER — PREDNISONE 20 MG PO TABS
60.0000 mg | ORAL_TABLET | Freq: Once | ORAL | Status: AC
  Administered 2016-12-23: 60 mg via ORAL
  Filled 2016-12-23: qty 3

## 2016-12-23 MED ORDER — IPRATROPIUM-ALBUTEROL 0.5-2.5 (3) MG/3ML IN SOLN
3.0000 mL | Freq: Once | RESPIRATORY_TRACT | Status: AC
  Administered 2016-12-23: 3 mL via RESPIRATORY_TRACT
  Filled 2016-12-23: qty 3

## 2016-12-23 MED ORDER — PREDNISONE 20 MG PO TABS: 60.0000 mg | ORAL_TABLET | Freq: Every day | ORAL | 0 refills | Status: AC

## 2016-12-23 NOTE — ED Notes (Signed)

## 2016-12-23 NOTE — Discharge Instructions (Signed)

## 2016-12-23 NOTE — ED Notes (Signed)
Patient transported to X-ray 

## 2016-12-23 NOTE — ED Triage Notes (Signed)
Pt came to ED via pov c/o chest pain x2 days. Reports sharp pain on left side of chest. History of empysema. Vs stable at this time.

## 2016-12-23 NOTE — ED Provider Notes (Signed)
[facility] Emergency Department Provider Note  ____________________________________________  Time seen: Approximately 8:46 PM  I have reviewed the triage vital signs and the nursing notes.   HISTORY  Chief Complaint Chest Pain   HPI Dustin Vazquez is a 61 y.o. male with a history of COPD who presents for evaluation of left-sided chest pain. Patient reports that he has had this pain daily since 12/04/2016. That day patient was found to have a very small pneumothorax and was a complication of a lung biopsy. This pneumothorax was managed medically. Since being discharged from the hospital patient has had daily pain and today had a more severe episode which prompted his visit to the emergency room. He describes the pain as sharp, located in the left lateral upper chest wall, reproducible on palpation and movement of his arm, constant and nonradiating. He denies any worsening shortness of breath than his baseline or worsening chronic cough. No fever or chills. No abdominal pain, nausea, vomiting, diarrhea, abdominal pain.  Past Medical History:  Diagnosis Date  . COPD (chronic obstructive pulmonary disease) (HCC)     There are no active problems to display for this patient.   History reviewed. No pertinent surgical history.  Prior to Admission medications   Medication Sig Start Date End Date Taking? Authorizing Provider  chlorpheniramine-HYDROcodone (TUSSIONEX PENNKINETIC ER) 10-8 MG/5ML SUER Take 5 mLs by mouth 2 (two) times daily. 05/28/15   Emily Filbert, MD  predniSONE (DELTASONE) 20 MG tablet Take 3 tablets (60 mg total) by mouth daily. 12/23/16 12/27/16  Nita Sickle, MD    Allergies Patient has no known allergies.  No family history on file.  Social History Social History  Substance Use Topics  . Smoking status: Never Smoker  . Smokeless tobacco: Never Used  . Alcohol use Yes     Comment: ocasionally    Review of  Systems  Constitutional: Negative for fever. Eyes: Negative for visual changes. ENT: Negative for sore throat. Neck: No neck pain  Cardiovascular: + chest pain. Respiratory: Negative for shortness of breath. Gastrointestinal: Negative for abdominal pain, vomiting or diarrhea. Genitourinary: Negative for dysuria. Musculoskeletal: Negative for back pain. Skin: Negative for rash. Neurological: Negative for headaches, weakness or numbness. Psych: No SI or HI  ____________________________________________   PHYSICAL EXAM:  VITAL SIGNS: ED Triage Vitals  Enc Vitals Group     BP 12/23/16 1823 120/71     Pulse Rate 12/23/16 1823 98     Resp 12/23/16 1823 18     Temp 12/23/16 1823 98.6 F (37 C)     Temp Source 12/23/16 1823 Oral     SpO2 12/23/16 1823 96 %     Weight 12/23/16 1824 176 lb (79.8 kg)     Height 12/23/16 1824 5\' 6"  (1.676 m)     Head Circumference --      Peak Flow --      Pain Score --      Pain Loc --      Pain Edu? --      Excl. in GC? --     Constitutional: Alert and oriented. Well appearing and in no apparent distress. HEENT:      Head: Normocephalic and atraumatic.         Eyes: Conjunctivae are normal. Sclera is non-icteric.       Mouth/Throat: Mucous membranes are moist.       Neck: Supple with no signs of meningismus. Cardiovascular: Regular rate and rhythm. No murmurs, gallops,  or rubs. 2+ symmetrical distal pulses are present in all extremities. No JVD.  Respiratory: Normal respiratory effort. Lungs with decreased air movement bilaterally and faint expiratory wheezes Gastrointestinal: Soft, non tender, and non distended with positive bowel sounds. No rebound or guarding. Musculoskeletal: Patient is very tender to palpation over the left lateral upper chest wall and pain is also reproducible with abduction and elevation of the L arm Neurologic: Normal speech and language. Face is symmetric. Moving all extremities. No gross focal neurologic deficits are  appreciated. Skin: Skin is warm, dry and intact. No rash noted. Psychiatric: Mood and affect are normal. Speech and behavior are normal.  ____________________________________________   LABS (all labs ordered are listed, but only abnormal results are displayed)  Labs Reviewed  BASIC METABOLIC PANEL - Abnormal; Notable for the following:       Result Value   Chloride 100 (*)    Glucose, Bld 107 (*)    All other components within normal limits  CBC - Abnormal; Notable for the following:    Platelets 119 (*)    All other components within normal limits  TROPONIN I   ____________________________________________  EKG  ED ECG REPORT I, Nita Sickle, the attending physician, personally viewed and interpreted this ECG.  Normal sinus rhythm, rate of 94, normal intervals, normal axis, no ST elevations or depressions. unchanged from prior from October 2nd, 2018 ____________________________________________  RADIOLOGY  CXR:  No acute interval change. Stable changes in the right apex. ____________________________________________   PROCEDURES  Procedure(s) performed: None Procedures Critical Care performed:  None ____________________________________________   INITIAL IMPRESSION / ASSESSMENT AND PLAN / ED COURSE  61 y.o. male with a history of COPD who presents for evaluation of left-sided chest pain. pain is musculoskeletal in nature, reproducible with palpation and also movement of his arm. I don't see any obvious trauma or rib deformities on exam. Chest x-ray shows no pneumothorax, infiltrate, or rib fractures. We'll send patient for rib series x-ray. Patient is also having a mild COPD exacerbation for which I will give him 1 DuoNeb treatment. Start patient on prednisone for both a COPD exacerbation and MSK pain. Will give Tylenol and a Lidoderm patch.    _________________________ 9:52 PM on 12/23/2016 -----------------------------------------  labs with no acute  changes.chest x-ray with rib fields showing no evidence of rib fracture or pneumothorax or an infiltrate. Patient is moving good air with resolution of wheezing after one DuoNeb. We'll put patient on prednisone for both of mild COPD exacerbation and possible chest wall muscle sprain. Recommended heat, tylenol, and close f/u at the [place] with PCP. discussed return precautions with patient.   As part of my medical decision making, I reviewed the following data within the electronic MEDICAL RECORD NUMBER Nursing notes reviewed and incorporated, Labs reviewed , EKG interpreted , Old EKG reviewed, Radiograph reviewed , Notes from prior ED visits and [place] Controlled Substance Database    Pertinent labs & imaging results that were available during my care of the patient were reviewed by me and considered in my medical decision making (see chart for details).    ____________________________________________   FINAL CLINICAL IMPRESSION(S) / ED DIAGNOSES  Final diagnoses:  Chest pain  Chest wall pain  COPD exacerbation (HCC)      NEW MEDICATIONS STARTED DURING THIS VISIT:  New Prescriptions   PREDNISONE (DELTASONE) 20 MG TABLET    Take 3 tablets (60 mg total) by mouth daily.     Note:  This document was prepared using Dragon  voice recognition software and may include unintentional dictation errors.    Nita SickleVeronese, [place], MD 12/23/16 2154

## 2017-04-16 ENCOUNTER — Emergency Department: Payer: Medicaid Other

## 2017-04-16 ENCOUNTER — Encounter: Payer: Self-pay | Admitting: Medical Oncology

## 2017-04-16 ENCOUNTER — Emergency Department
Admission: EM | Admit: 2017-04-16 | Discharge: 2017-04-16 | Disposition: A | Discharge status: Home / Self Care | Payer: Medicaid Other | Attending: Emergency Medicine | Admitting: Emergency Medicine

## 2017-04-16 DIAGNOSIS — W010XXA Fall on same level from slipping, tripping and stumbling without subsequent striking against object, initial encounter: Secondary | ICD-10-CM | POA: Diagnosis not present

## 2017-04-16 DIAGNOSIS — J449 Chronic obstructive pulmonary disease, unspecified: Secondary | ICD-10-CM | POA: Diagnosis not present

## 2017-04-16 DIAGNOSIS — S2020XA Contusion of thorax, unspecified, initial encounter: Secondary | ICD-10-CM | POA: Insufficient documentation

## 2017-04-16 DIAGNOSIS — Y939 Activity, unspecified: Secondary | ICD-10-CM | POA: Diagnosis not present

## 2017-04-16 DIAGNOSIS — Y999 Unspecified external cause status: Secondary | ICD-10-CM | POA: Diagnosis not present

## 2017-04-16 DIAGNOSIS — W19XXXA Unspecified fall, initial encounter: Secondary | ICD-10-CM

## 2017-04-16 DIAGNOSIS — S299XXA Unspecified injury of thorax, initial encounter: Secondary | ICD-10-CM | POA: Diagnosis present

## 2017-04-16 DIAGNOSIS — Y929 Unspecified place or not applicable: Secondary | ICD-10-CM | POA: Diagnosis not present

## 2017-04-16 MED ORDER — MELOXICAM 7.5 MG PO TABS: 7.5000 mg | ORAL_TABLET | Freq: Every day | ORAL | 0 refills | Status: AC

## 2017-04-16 NOTE — ED Provider Notes (Signed)
[facility] Emergency Department Provider Note  ____________________________________________  Time seen: Approximately 3:48 PM  I have reviewed the triage vital signs and the nursing notes.   HISTORY  Chief Complaint Fall and rib pain    HPI Dustin Vazquez is a 62 y.o. male that presents to the  emergency department for evaluation of left rib pain after falling 2 days ago. He has a bruise over his left rib cage that is painful. Pain is worse with deep breathing.  Patient states that he was unsteady on his feet and lost his balance, which caused him to fall.  He did not hit his head or lose consciousness.  He does not take any blood thinners.  No chest pain, shortness of breath, nausea, vomiting, abdominal pain.   Past Medical History:  Diagnosis Date  . COPD (chronic obstructive pulmonary disease) (HCC)     There are no active problems to display for this patient.   History reviewed. No pertinent surgical history.  Prior to Admission medications   Medication Sig Start Date End Date Taking? Authorizing Provider  chlorpheniramine-HYDROcodone (TUSSIONEX PENNKINETIC ER) 10-8 MG/5ML SUER Take 5 mLs by mouth 2 (two) times daily. 05/28/15   Emily FilbertWilliams, Jonathan E, MD  meloxicam (MOBIC) 7.5 MG tablet Take 1 tablet (7.5 mg total) by mouth daily. 04/16/17 04/16/18  Enid DerryWagner, Patryce Depriest, PA-C    Allergies Patient has no known allergies.  No family history on file.  Social History Social History   Tobacco Use  . Smoking status: Never Smoker  . Smokeless tobacco: Never Used  Substance Use Topics  . Alcohol use: Yes    Comment: ocasionally  . Drug use: Not on file     Review of Systems  Cardiovascular: No chest pain. Respiratory:  No SOB. Gastrointestinal: No abdominal pain.  No nausea, no vomiting.  Musculoskeletal: Positive for rib pain. Skin: Negative for rash, abrasions, lacerations. Neurological: Negative for headaches, numbness or  tingling   ____________________________________________   PHYSICAL EXAM:  VITAL SIGNS: ED Triage Vitals  Enc Vitals Group     BP 04/16/17 1443 120/80     Pulse Rate 04/16/17 1443 97     Resp 04/16/17 1443 18     Temp 04/16/17 1443 98 F (36.7 C)     Temp Source 04/16/17 1443 Oral     SpO2 04/16/17 1443 94 %     Weight 04/16/17 1444 160 lb (72.6 kg)     Height 04/16/17 1444 5\' 6"  (1.676 m)     Head Circumference --      Peak Flow --      Pain Score 04/16/17 1444 10     Pain Loc --      Pain Edu? --      Excl. in GC? --      Constitutional: Alert and oriented. Well appearing and in no acute distress. Eyes: Conjunctivae are normal. PERRL. EOMI. Head: Atraumatic. ENT:      Ears:      Nose: No congestion/rhinnorhea.      Mouth/Throat: Mucous membranes are moist.  Neck: No stridor.   Cardiovascular: Normal rate, regular rhythm.  Good peripheral circulation. Respiratory: Normal respiratory effort without tachypnea or retractions. Lungs CTAB. Good air entry to the bases with no decreased or absent breath sounds. Gastrointestinal: Bowel sounds 4 quadrants. Soft and nontender to palpation. No guarding or rigidity. No palpable masses. No distention.  Musculoskeletal: Full range of motion to all extremities. No gross deformities appreciated. Neurologic:  Normal speech  and language. No gross focal neurologic deficits are appreciated.  Skin:  Skin is warm, dry and intact.  Mild bruising to left lateral rib cage.   ____________________________________________   LABS (all labs ordered are listed, but only abnormal results are displayed)  Labs Reviewed - No data to display ____________________________________________  EKG   ____________________________________________  RADIOLOGY Lexine Baton, personally viewed and evaluated these images (plain radiographs) as part of my medical decision making, as well as reviewing the written report by the radiologist.  Dg Ribs  Unilateral W/chest Left  Result Date: 04/16/2017 CLINICAL DATA:  Imbalance.  Fell.  Bruising. EXAM: LEFT RIBS AND CHEST - 3+ VIEW COMPARISON:  12/23/2016. FINDINGS: No fracture or other bone lesions are seen involving the ribs. There is no evidence of pneumothorax or pleural effusion. Normal cardiomediastinal silhouette. Previous LEFT lung biopsy. No consolidation or edema. Stable scarring RIGHT upper lobe. IMPRESSION: No fracture or pneumothorax.  Chronic changes as described. Electronically Signed   By: Elsie Stain M.D.   On: 04/16/2017 16:16    ____________________________________________    PROCEDURES  Procedure(s) performed:    Procedures    Medications - No data to display   ____________________________________________   INITIAL IMPRESSION / ASSESSMENT AND PLAN / ED COURSE  Pertinent labs & imaging results that were available during my care of the patient were reviewed by me and considered in my medical decision making (see chart for details).  Review of the [place] CSRS was performed in accordance of the [facility] prior to dispensing any controlled drugs.     Patient presented to the emergency department for evaluation of left rib pain after falling 2 days ago.  Chest x-ray negative for rib fracture or acute cardiopulmonary processes. Patient will be discharged home with prescriptions for Mobic. Patient is to follow up with PCP as directed. Patient is given ED precautions to return to the ED for any worsening or new symptoms.    ____________________________________________  FINAL CLINICAL IMPRESSION(S) / ED DIAGNOSES  Final diagnoses:  Fall, initial encounter  Contusion of thoracic wall, unspecified area of thoracic wall, initial encounter      NEW MEDICATIONS STARTED DURING THIS VISIT:  ED Discharge Orders        Ordered    meloxicam (MOBIC) 7.5 MG tablet  Daily     04/16/17 1653          This chart was dictated using voice recognition software/Dragon.  Despite best efforts to proofread, errors can occur which can change the meaning. Any change was purely unintentional.    Enid Derry, PA-C 04/16/17 1742    Dionne Bucy, MD 04/16/17 2211

## 2017-04-16 NOTE — ED Triage Notes (Signed)
Pt reports he lost his balance Saturday and fell onto his left rib cage. Bruising noted to area.

## 2020-11-14 ENCOUNTER — Inpatient Hospital Stay
Admission: EM | Admit: 2020-11-14 | Discharge: 2020-12-07 | DRG: 871 | Disposition: A | Discharge status: Skilled Nursing Facility | Payer: Medicare Other | Attending: Hospitalist | Admitting: Hospitalist

## 2020-11-14 ENCOUNTER — Emergency Department: Payer: Medicare Other

## 2020-11-14 ENCOUNTER — Other Ambulatory Visit: Payer: Self-pay

## 2020-11-14 DIAGNOSIS — R7881 Bacteremia: Secondary | ICD-10-CM | POA: Diagnosis present

## 2020-11-14 DIAGNOSIS — A4152 Sepsis due to Pseudomonas: Principal | ICD-10-CM | POA: Diagnosis present

## 2020-11-14 DIAGNOSIS — D689 Coagulation defect, unspecified: Secondary | ICD-10-CM | POA: Diagnosis present

## 2020-11-14 DIAGNOSIS — I471 Supraventricular tachycardia: Secondary | ICD-10-CM | POA: Diagnosis not present

## 2020-11-14 DIAGNOSIS — N390 Urinary tract infection, site not specified: Secondary | ICD-10-CM | POA: Diagnosis present

## 2020-11-14 DIAGNOSIS — D6959 Other secondary thrombocytopenia: Secondary | ICD-10-CM | POA: Diagnosis not present

## 2020-11-14 DIAGNOSIS — G928 Other toxic encephalopathy: Secondary | ICD-10-CM | POA: Diagnosis present

## 2020-11-14 DIAGNOSIS — D696 Thrombocytopenia, unspecified: Secondary | ICD-10-CM | POA: Diagnosis not present

## 2020-11-14 DIAGNOSIS — Z6821 Body mass index (BMI) 21.0-21.9, adult: Secondary | ICD-10-CM

## 2020-11-14 DIAGNOSIS — N170 Acute kidney failure with tubular necrosis: Secondary | ICD-10-CM | POA: Diagnosis present

## 2020-11-14 DIAGNOSIS — E8809 Other disorders of plasma-protein metabolism, not elsewhere classified: Secondary | ICD-10-CM | POA: Diagnosis present

## 2020-11-14 DIAGNOSIS — R945 Abnormal results of liver function studies: Secondary | ICD-10-CM

## 2020-11-14 DIAGNOSIS — I4891 Unspecified atrial fibrillation: Secondary | ICD-10-CM | POA: Diagnosis not present

## 2020-11-14 DIAGNOSIS — Z66 Do not resuscitate: Secondary | ICD-10-CM | POA: Diagnosis not present

## 2020-11-14 DIAGNOSIS — R338 Other retention of urine: Secondary | ICD-10-CM | POA: Diagnosis not present

## 2020-11-14 DIAGNOSIS — Z4659 Encounter for fitting and adjustment of other gastrointestinal appliance and device: Secondary | ICD-10-CM

## 2020-11-14 DIAGNOSIS — R188 Other ascites: Secondary | ICD-10-CM

## 2020-11-14 DIAGNOSIS — S3730XA Unspecified injury of urethra, initial encounter: Secondary | ICD-10-CM | POA: Diagnosis not present

## 2020-11-14 DIAGNOSIS — B965 Pseudomonas (aeruginosa) (mallei) (pseudomallei) as the cause of diseases classified elsewhere: Secondary | ICD-10-CM | POA: Diagnosis present

## 2020-11-14 DIAGNOSIS — Z7189 Other specified counseling: Secondary | ICD-10-CM | POA: Diagnosis not present

## 2020-11-14 DIAGNOSIS — R339 Retention of urine, unspecified: Secondary | ICD-10-CM | POA: Diagnosis not present

## 2020-11-14 DIAGNOSIS — Z1389 Encounter for screening for other disorder: Secondary | ICD-10-CM

## 2020-11-14 DIAGNOSIS — Z20822 Contact with and (suspected) exposure to covid-19: Secondary | ICD-10-CM | POA: Diagnosis present

## 2020-11-14 DIAGNOSIS — E860 Dehydration: Secondary | ICD-10-CM | POA: Diagnosis present

## 2020-11-14 DIAGNOSIS — A4150 Gram-negative sepsis, unspecified: Secondary | ICD-10-CM | POA: Diagnosis not present

## 2020-11-14 DIAGNOSIS — N139 Obstructive and reflux uropathy, unspecified: Secondary | ICD-10-CM | POA: Diagnosis not present

## 2020-11-14 DIAGNOSIS — E43 Unspecified severe protein-calorie malnutrition: Secondary | ICD-10-CM | POA: Diagnosis present

## 2020-11-14 DIAGNOSIS — F10229 Alcohol dependence with intoxication, unspecified: Secondary | ICD-10-CM | POA: Diagnosis present

## 2020-11-14 DIAGNOSIS — R319 Hematuria, unspecified: Secondary | ICD-10-CM | POA: Diagnosis not present

## 2020-11-14 DIAGNOSIS — R6520 Severe sepsis without septic shock: Secondary | ICD-10-CM | POA: Diagnosis present

## 2020-11-14 DIAGNOSIS — R3912 Poor urinary stream: Secondary | ICD-10-CM | POA: Diagnosis not present

## 2020-11-14 DIAGNOSIS — E870 Hyperosmolality and hypernatremia: Secondary | ICD-10-CM | POA: Diagnosis not present

## 2020-11-14 DIAGNOSIS — N179 Acute kidney failure, unspecified: Secondary | ICD-10-CM | POA: Diagnosis not present

## 2020-11-14 DIAGNOSIS — E871 Hypo-osmolality and hyponatremia: Secondary | ICD-10-CM | POA: Diagnosis not present

## 2020-11-14 DIAGNOSIS — R059 Cough, unspecified: Secondary | ICD-10-CM

## 2020-11-14 DIAGNOSIS — Z23 Encounter for immunization: Secondary | ICD-10-CM | POA: Diagnosis present

## 2020-11-14 DIAGNOSIS — I350 Nonrheumatic aortic (valve) stenosis: Secondary | ICD-10-CM | POA: Diagnosis not present

## 2020-11-14 DIAGNOSIS — G9341 Metabolic encephalopathy: Secondary | ICD-10-CM | POA: Diagnosis present

## 2020-11-14 DIAGNOSIS — K859 Acute pancreatitis without necrosis or infection, unspecified: Secondary | ICD-10-CM

## 2020-11-14 DIAGNOSIS — R310 Gross hematuria: Secondary | ICD-10-CM | POA: Diagnosis not present

## 2020-11-14 DIAGNOSIS — R6521 Severe sepsis with septic shock: Secondary | ICD-10-CM | POA: Diagnosis present

## 2020-11-14 DIAGNOSIS — E861 Hypovolemia: Secondary | ICD-10-CM | POA: Diagnosis not present

## 2020-11-14 DIAGNOSIS — K7030 Alcoholic cirrhosis of liver without ascites: Secondary | ICD-10-CM | POA: Diagnosis present

## 2020-11-14 DIAGNOSIS — E876 Hypokalemia: Secondary | ICD-10-CM | POA: Diagnosis not present

## 2020-11-14 DIAGNOSIS — K7031 Alcoholic cirrhosis of liver with ascites: Secondary | ICD-10-CM | POA: Diagnosis present

## 2020-11-14 DIAGNOSIS — J439 Emphysema, unspecified: Secondary | ICD-10-CM | POA: Diagnosis present

## 2020-11-14 DIAGNOSIS — R4182 Altered mental status, unspecified: Secondary | ICD-10-CM | POA: Diagnosis not present

## 2020-11-14 DIAGNOSIS — Z515 Encounter for palliative care: Secondary | ICD-10-CM | POA: Diagnosis not present

## 2020-11-14 DIAGNOSIS — A419 Sepsis, unspecified organism: Secondary | ICD-10-CM | POA: Diagnosis present

## 2020-11-14 DIAGNOSIS — E878 Other disorders of electrolyte and fluid balance, not elsewhere classified: Secondary | ICD-10-CM | POA: Diagnosis not present

## 2020-11-14 DIAGNOSIS — R509 Fever, unspecified: Secondary | ICD-10-CM

## 2020-11-14 DIAGNOSIS — E512 Wernicke's encephalopathy: Secondary | ICD-10-CM | POA: Diagnosis present

## 2020-11-14 DIAGNOSIS — I509 Heart failure, unspecified: Secondary | ICD-10-CM | POA: Diagnosis not present

## 2020-11-14 DIAGNOSIS — R54 Age-related physical debility: Secondary | ICD-10-CM | POA: Diagnosis not present

## 2020-11-14 LAB — URINALYSIS, COMPLETE (UACMP) WITH MICROSCOPIC
Glucose, UA: 100 mg/dL — AB
Hgb urine dipstick: NEGATIVE
Nitrite: POSITIVE — AB
Protein, ur: 30 mg/dL — AB
Specific Gravity, Urine: 1.025 (ref 1.005–1.030)
Squamous Epithelial / HPF: NONE SEEN (ref 0–5)
pH: 5.5 (ref 5.0–8.0)

## 2020-11-14 LAB — LACTIC ACID, PLASMA
Lactic Acid, Venous: 3.7 mmol/L (ref 0.5–1.9)
Lactic Acid, Venous: 2.5 mmol/L (ref 0.5–1.9)

## 2020-11-14 LAB — COMPREHENSIVE METABOLIC PANEL
ALT: 34 U/L (ref 0–44)
AST: 89 U/L — ABNORMAL HIGH (ref 15–41)
Albumin: 2.8 g/dL — ABNORMAL LOW (ref 3.5–5.0)
Alkaline Phosphatase: 74 U/L (ref 38–126)
Anion gap: 15 (ref 5–15)
BUN: 41 mg/dL — ABNORMAL HIGH (ref 8–23)
CO2: 26 mmol/L (ref 22–32)
Calcium: 9.2 mg/dL (ref 8.9–10.3)
Chloride: 98 mmol/L (ref 98–111)
Creatinine, Ser: 2.07 mg/dL — ABNORMAL HIGH (ref 0.61–1.24)
GFR, Estimated: 35 mL/min — ABNORMAL LOW (ref >60)
Glucose, Bld: 95 mg/dL (ref 70–99)
Potassium: 3.1 mmol/L — ABNORMAL LOW (ref 3.5–5.1)
Sodium: 139 mmol/L (ref 135–145)
Total Bilirubin: 2.5 mg/dL — ABNORMAL HIGH (ref 0.3–1.2)
Total Protein: 6.8 g/dL (ref 6.5–8.1)

## 2020-11-14 LAB — CBC WITH DIFFERENTIAL/PLATELET
Abs Immature Granulocytes: 0.2 10*3/uL — ABNORMAL HIGH (ref 0.00–0.07)
Basophils Absolute: 0.1 10*3/uL (ref 0.0–0.1)
Basophils Relative: 0 %
Eosinophils Absolute: 0 10*3/uL (ref 0.0–0.5)
Eosinophils Relative: 0 %
HCT: 40 % (ref 39.0–52.0)
Hemoglobin: 14.4 g/dL (ref 13.0–17.0)
Immature Granulocytes: 2 %
Lymphocytes Relative: 9 %
Lymphs Abs: 1.2 10*3/uL (ref 0.7–4.0)
MCH: 33.3 pg (ref 26.0–34.0)
MCHC: 36 g/dL (ref 30.0–36.0)
MCV: 92.6 fL (ref 80.0–100.0)
Monocytes Absolute: 1 10*3/uL (ref 0.1–1.0)
Monocytes Relative: 8 %
Neutro Abs: 10.9 10*3/uL — ABNORMAL HIGH (ref 1.7–7.7)
Neutrophils Relative %: 81 %
Platelets: 216 10*3/uL (ref 150–400)
RBC: 4.32 MIL/uL (ref 4.22–5.81)
RDW: 12.9 % (ref 11.5–15.5)
WBC: 13.4 10*3/uL — ABNORMAL HIGH (ref 4.0–10.5)
nRBC: 0.3 % — ABNORMAL HIGH (ref 0.0–0.2)

## 2020-11-14 LAB — MAGNESIUM: Magnesium: 2.1 mg/dL (ref 1.7–2.4)

## 2020-11-14 LAB — RESP PANEL BY RT-PCR (FLU A&B, COVID) ARPGX2
Influenza A by PCR: NEGATIVE
Influenza B by PCR: NEGATIVE
SARS Coronavirus 2 by RT PCR: NEGATIVE

## 2020-11-14 LAB — APTT: aPTT: 36 s (ref 24–36)

## 2020-11-14 LAB — PROTIME-INR
INR: 1.5 — ABNORMAL HIGH (ref 0.8–1.2)
Prothrombin Time: 17.8 seconds — ABNORMAL HIGH (ref 11.4–15.2)

## 2020-11-14 LAB — ETHANOL: Alcohol, Ethyl (B): <10 mg/dL (ref <10)

## 2020-11-14 LAB — LIPASE, BLOOD: Lipase: 141 U/L — ABNORMAL HIGH (ref 11–51)

## 2020-11-14 LAB — CK: Total CK: 258 U/L (ref 49–397)

## 2020-11-14 MED ORDER — ONDANSETRON HCL 4 MG/2ML IJ SOLN: 4.0000 mg | Freq: Four times a day (QID) | INTRAMUSCULAR | Status: DC | PRN

## 2020-11-14 MED ORDER — SODIUM CHLORIDE 0.9 % IV SOLN: 2.0000 g | INTRAVENOUS | Status: DC

## 2020-11-14 MED ORDER — SODIUM CHLORIDE 0.9 % IV SOLN
250.0000 mL | INTRAVENOUS | Status: DC
  Administered 2020-11-14 – 2020-11-30 (×3): 250 mL via INTRAVENOUS

## 2020-11-14 MED ORDER — ONDANSETRON HCL 4 MG PO TABS: 4.0000 mg | ORAL_TABLET | Freq: Four times a day (QID) | ORAL | Status: DC | PRN

## 2020-11-14 MED ORDER — ACETAMINOPHEN 325 MG PO TABS
650.0000 mg | ORAL_TABLET | Freq: Four times a day (QID) | ORAL | Status: DC | PRN
  Administered 2020-11-17 – 2020-12-02 (×6): 650 mg via ORAL
  Filled 2020-11-14 (×6): qty 2

## 2020-11-14 MED ORDER — LACTATED RINGERS IV SOLN: INTRAVENOUS | Status: AC

## 2020-11-14 MED ORDER — ZOLPIDEM TARTRATE 5 MG PO TABS: 5.0000 mg | ORAL_TABLET | Freq: Every evening | ORAL | Status: DC | PRN

## 2020-11-14 MED ORDER — POTASSIUM CHLORIDE 20 MEQ PO PACK
40.0000 meq | PACK | Freq: Once | ORAL | Status: DC
  Filled 2020-11-14: qty 2

## 2020-11-14 MED ORDER — SODIUM CHLORIDE 0.9 % IV SOLN: INTRAVENOUS | Status: DC

## 2020-11-14 MED ORDER — SODIUM CHLORIDE 0.9 % IV SOLN
1000.0000 mL | Freq: Once | INTRAVENOUS | Status: AC
  Administered 2020-11-14: 1000 mL via INTRAVENOUS

## 2020-11-14 MED ORDER — NOREPINEPHRINE 4 MG/250ML-% IV SOLN
INTRAVENOUS | Status: AC
  Administered 2020-11-14: 2 ug/min via INTRAVENOUS
  Filled 2020-11-14: qty 250

## 2020-11-14 MED ORDER — SODIUM CHLORIDE 0.9 % IV SOLN
1.0000 g | Freq: Once | INTRAVENOUS | Status: AC
  Administered 2020-11-14: 1 g via INTRAVENOUS
  Filled 2020-11-14: qty 10

## 2020-11-14 MED ORDER — SODIUM CHLORIDE 0.9 % IV BOLUS
1000.0000 mL | Freq: Once | INTRAVENOUS | Status: AC
  Administered 2020-11-14: 1000 mL via INTRAVENOUS

## 2020-11-14 MED ORDER — NOREPINEPHRINE 4 MG/250ML-% IV SOLN
2.0000 ug/min | INTRAVENOUS | Status: DC
  Administered 2020-11-15: 2 ug/min via INTRAVENOUS

## 2020-11-14 MED ORDER — MIDODRINE HCL 5 MG PO TABS
10.0000 mg | ORAL_TABLET | Freq: Three times a day (TID) | ORAL | Status: DC
  Administered 2020-11-14 – 2020-11-18 (×10): 10 mg via ORAL
  Filled 2020-11-14 (×11): qty 2

## 2020-11-14 MED ORDER — MAGNESIUM HYDROXIDE 400 MG/5ML PO SUSP
30.0000 mL | Freq: Every day | ORAL | Status: DC | PRN
  Filled 2020-11-14: qty 30

## 2020-11-14 MED ORDER — POTASSIUM CHLORIDE 20 MEQ PO PACK
40.0000 meq | PACK | Freq: Once | ORAL | Status: AC
  Administered 2020-11-14: 40 meq via ORAL
  Filled 2020-11-14: qty 2

## 2020-11-14 MED ORDER — ENOXAPARIN SODIUM 40 MG/0.4ML IJ SOSY
40.0000 mg | PREFILLED_SYRINGE | INTRAMUSCULAR | Status: DC
  Administered 2020-11-14 – 2020-11-16 (×3): 40 mg via SUBCUTANEOUS
  Filled 2020-11-14 (×3): qty 0.4

## 2020-11-14 MED ORDER — SODIUM CHLORIDE 0.9 % IV BOLUS (SEPSIS)
1000.0000 mL | Freq: Once | INTRAVENOUS | Status: AC
  Administered 2020-11-14: 1000 mL via INTRAVENOUS

## 2020-11-14 MED ORDER — ACETAMINOPHEN 650 MG RE SUPP
650.0000 mg | Freq: Four times a day (QID) | RECTAL | Status: DC | PRN
  Filled 2020-11-14: qty 1

## 2020-11-14 NOTE — Progress Notes (Signed)
Pt being followed by [facility] for Sepsis protocol. 
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
## 2020-11-14 NOTE — ED Notes (Signed)
Lactic of 3.7

## 2020-11-14 NOTE — Consult Note (Signed)
CODE SEPSIS - PHARMACY COMMUNICATION  **Broad Spectrum Antibiotics should be administered within 1 hour of Sepsis diagnosis**  Time Code Sepsis Called/Page Received: 2133  Antibiotics Ordered: 2101  Time of 1st antibiotic administration: 2114  Additional action taken by pharmacy: N/A  If necessary, Name of Provider/Nurse Contacted: N/A    Derrek Gu ,PharmD Clinical Pharmacist  11/14/2020  9:40 PM

## 2020-11-14 NOTE — ED Notes (Signed)
Pt placed on bare hugger at this time.
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
## 2020-11-14 NOTE — ED Notes (Addendum)
Dr. Arville Care gave verbal orders for 233ml/hr of LR stat.

## 2020-11-15 ENCOUNTER — Inpatient Hospital Stay: Payer: Medicare Other

## 2020-11-15 DIAGNOSIS — N179 Acute kidney failure, unspecified: Secondary | ICD-10-CM | POA: Diagnosis not present

## 2020-11-15 DIAGNOSIS — R6521 Severe sepsis with septic shock: Secondary | ICD-10-CM | POA: Diagnosis not present

## 2020-11-15 DIAGNOSIS — R945 Abnormal results of liver function studies: Secondary | ICD-10-CM

## 2020-11-15 DIAGNOSIS — A419 Sepsis, unspecified organism: Secondary | ICD-10-CM | POA: Diagnosis not present

## 2020-11-15 LAB — BASIC METABOLIC PANEL
Anion gap: 10 (ref 5–15)
BUN: 33 mg/dL — ABNORMAL HIGH (ref 8–23)
CO2: 20 mmol/L — ABNORMAL LOW (ref 22–32)
Calcium: 7.9 mg/dL — ABNORMAL LOW (ref 8.9–10.3)
Chloride: 110 mmol/L (ref 98–111)
Creatinine, Ser: 1.48 mg/dL — ABNORMAL HIGH (ref 0.61–1.24)
GFR, Estimated: 52 mL/min — ABNORMAL LOW (ref >60)
Glucose, Bld: 102 mg/dL — ABNORMAL HIGH (ref 70–99)
Potassium: 3.9 mmol/L (ref 3.5–5.1)
Sodium: 140 mmol/L (ref 135–145)

## 2020-11-15 LAB — GLUCOSE, CAPILLARY
Glucose-Capillary: 93 mg/dL (ref 70–99)
Glucose-Capillary: 88 mg/dL (ref 70–99)
Glucose-Capillary: 90 mg/dL (ref 70–99)
Glucose-Capillary: 102 mg/dL — ABNORMAL HIGH (ref 70–99)
Glucose-Capillary: 104 mg/dL — ABNORMAL HIGH (ref 70–99)
Glucose-Capillary: 116 mg/dL — ABNORMAL HIGH (ref 70–99)

## 2020-11-15 LAB — CBC
HCT: 35.5 % — ABNORMAL LOW (ref 39.0–52.0)
Hemoglobin: 12.8 g/dL — ABNORMAL LOW (ref 13.0–17.0)
MCH: 34.2 pg — ABNORMAL HIGH (ref 26.0–34.0)
MCHC: 36.1 g/dL — ABNORMAL HIGH (ref 30.0–36.0)
MCV: 94.9 fL (ref 80.0–100.0)
Platelets: 179 10*3/uL (ref 150–400)
RBC: 3.74 MIL/uL — ABNORMAL LOW (ref 4.22–5.81)
RDW: 13.2 % (ref 11.5–15.5)
WBC: 13.2 10*3/uL — ABNORMAL HIGH (ref 4.0–10.5)
nRBC: 0 % (ref 0.0–0.2)

## 2020-11-15 LAB — HIV ANTIBODY (ROUTINE TESTING W REFLEX): HIV Screen 4th Generation wRfx: NONREACTIVE

## 2020-11-15 LAB — URINE DRUG SCREEN, QUALITATIVE (ARMC ONLY)
Amphetamines, Ur Screen: NOT DETECTED
Barbiturates, Ur Screen: NOT DETECTED
Benzodiazepine, Ur Scrn: NOT DETECTED
Cannabinoid 50 Ng, Ur ~~LOC~~: NOT DETECTED
Cocaine Metabolite,Ur ~~LOC~~: NOT DETECTED
MDMA (Ecstasy)Ur Screen: NOT DETECTED
Methadone Scn, Ur: NOT DETECTED
Opiate, Ur Screen: NOT DETECTED
Phencyclidine (PCP) Ur S: NOT DETECTED
Tricyclic, Ur Screen: NOT DETECTED

## 2020-11-15 LAB — PROTIME-INR
INR: 1.5 — ABNORMAL HIGH (ref 0.8–1.2)
Prothrombin Time: 18.1 seconds — ABNORMAL HIGH (ref 11.4–15.2)

## 2020-11-15 LAB — PROCALCITONIN: Procalcitonin: 0.43 ng/mL

## 2020-11-15 LAB — CORTISOL-AM, BLOOD: Cortisol - AM: 15.7 ug/dL (ref 6.7–22.6)

## 2020-11-15 LAB — MRSA NEXT GEN BY PCR, NASAL: MRSA by PCR Next Gen: NOT DETECTED

## 2020-11-15 LAB — TSH: TSH: 0.742 u[IU]/mL (ref 0.350–4.500)

## 2020-11-15 LAB — LACTIC ACID, PLASMA: Lactic Acid, Venous: 2.4 mmol/L (ref 0.5–1.9)

## 2020-11-15 MED ORDER — LACTATED RINGERS IV BOLUS
500.0000 mL | Freq: Once | INTRAVENOUS | Status: AC
  Administered 2020-11-15: 500 mL via INTRAVENOUS

## 2020-11-15 MED ORDER — LACTATED RINGERS IV BOLUS
1000.0000 mL | Freq: Once | INTRAVENOUS | Status: AC
  Administered 2020-11-15: 1000 mL via INTRAVENOUS

## 2020-11-15 MED ORDER — ADULT MULTIVITAMIN W/MINERALS CH
1.0000 | ORAL_TABLET | Freq: Every day | ORAL | Status: DC
  Administered 2020-11-16 – 2020-11-17 (×2): 1 via ORAL
  Filled 2020-11-15 (×2): qty 1

## 2020-11-15 MED ORDER — SODIUM CHLORIDE 0.9 % IV SOLN: 2.0000 g | INTRAVENOUS | Status: DC

## 2020-11-15 MED ORDER — CHLORHEXIDINE GLUCONATE CLOTH 2 % EX PADS
6.0000 | MEDICATED_PAD | Freq: Every day | CUTANEOUS | Status: DC
  Administered 2020-11-16 – 2020-12-07 (×19): 6 via TOPICAL

## 2020-11-15 MED ORDER — SODIUM CHLORIDE 0.9 % IV SOLN
2.0000 g | INTRAVENOUS | Status: DC
  Administered 2020-11-15: 2 g via INTRAVENOUS
  Filled 2020-11-15 (×2): qty 20

## 2020-11-15 MED ORDER — ENSURE ENLIVE PO LIQD
237.0000 mL | Freq: Two times a day (BID) | ORAL | Status: DC
  Administered 2020-11-15 – 2020-11-16 (×2): 237 mL via ORAL

## 2020-11-15 NOTE — ED Notes (Signed)
Cousin of pt would like an update. Please call John @ [phone]
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
## 2020-11-15 NOTE — TOC Progression Note (Addendum)
Transition of Care [facility]) - Progression Note    Patient Details  Name: Dustin Vazquez MRN: [id number] Date of Birth: Aug 02, 1955  [facility] of [facility] CM/SW Contact  Hetty Ely, RN Phone Number: 11/15/2020, 12:50 PM  Clinical Narrative: Attempted to call family using both numbers on file, no answer left voice message to return call. Return call [phone], caller says she does not know patient and did not know niece listed.         Expected Discharge Plan and Services                                                 Social Determinants of Health (SDOH) Interventions    Readmission Risk Interventions No flowsheet data found.
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
## 2020-11-15 NOTE — Plan of Care (Signed)
  Problem: Education: Goal: Knowledge of General Education information will improve Description: Including pain rating scale, medication(s)/side effects and non-pharmacologic comfort measures Outcome: Not Progressing   Problem: Health Behavior/Discharge Planning: Goal: Ability to manage health-related needs will improve Outcome: Not Progressing   Problem: Clinical Measurements: Goal: Ability to maintain clinical measurements within normal limits will improve Outcome: Not Progressing Goal: Will remain free from infection Outcome: Not Progressing Goal: Diagnostic test results will improve Outcome: Not Progressing Goal: Respiratory complications will improve Outcome: Not Progressing Goal: Cardiovascular complication will be avoided Outcome: Not Progressing   Problem: Activity: Goal: Risk for activity intolerance will decrease Outcome: Not Progressing   Problem: Nutrition: Goal: Adequate nutrition will be maintained Outcome: Not Progressing   Problem: Coping: Goal: Level of anxiety will decrease Outcome: Not Progressing   Problem: Elimination: Goal: Will not experience complications related to bowel motility Outcome: Not Progressing Goal: Will not experience complications related to urinary retention Outcome: Not Progressing   Problem: Pain Managment: Goal: General experience of comfort will improve Outcome: Not Progressing   Problem: Safety: Goal: Ability to remain free from injury will improve Outcome: Not Progressing   Problem: Skin Integrity: Goal: Risk for impaired skin integrity will decrease Outcome: Not Progressing Pt total care unable to make needs known due to confusion patient needs assist with turns and feeding

## 2020-11-15 NOTE — ED Notes (Signed)
Bair hugger discontinued at this time.

## 2020-11-15 NOTE — Progress Notes (Signed)
eLink Physician-Brief Progress Note Patient Name: Dustin Vazquez DOB: 12/05/1955 MRN: [id number]   Date of Service  11/15/2020  HPI/Events of Note  Patient admitted with septic shock of urinary tract origin, acute kidney injury, hypokalemia, slightly elevated serum lipase of unclear significance, along with subtle LFT abnormalities suggestive of possible ETOH liver disease, he also presented with altered mental status which improved with resuscitation.  eICU Interventions  New Patient Evaluation.        Thomasene Lot Lilyannah Zuelke 11/15/2020, 1:38 AM

## 2020-11-16 ENCOUNTER — Other Ambulatory Visit: Payer: Self-pay

## 2020-11-16 ENCOUNTER — Encounter: Payer: Self-pay | Admitting: Family Medicine

## 2020-11-16 DIAGNOSIS — R6520 Severe sepsis without septic shock: Secondary | ICD-10-CM

## 2020-11-16 DIAGNOSIS — A419 Sepsis, unspecified organism: Secondary | ICD-10-CM | POA: Diagnosis not present

## 2020-11-16 DIAGNOSIS — E43 Unspecified severe protein-calorie malnutrition: Secondary | ICD-10-CM | POA: Diagnosis not present

## 2020-11-16 LAB — GLUCOSE, CAPILLARY
Glucose-Capillary: 104 mg/dL — ABNORMAL HIGH (ref 70–99)
Glucose-Capillary: 132 mg/dL — ABNORMAL HIGH (ref 70–99)
Glucose-Capillary: 141 mg/dL — ABNORMAL HIGH (ref 70–99)
Glucose-Capillary: 150 mg/dL — ABNORMAL HIGH (ref 70–99)
Glucose-Capillary: 219 mg/dL — ABNORMAL HIGH (ref 70–99)
Glucose-Capillary: 94 mg/dL (ref 70–99)
Glucose-Capillary: 97 mg/dL (ref 70–99)

## 2020-11-16 LAB — AMMONIA: Ammonia: 63 umol/L — ABNORMAL HIGH (ref 9–35)

## 2020-11-16 LAB — VITAMIN B12: Vitamin B-12: 2270 pg/mL — ABNORMAL HIGH (ref 180–914)

## 2020-11-16 MED ORDER — THIAMINE HCL 100 MG/ML IJ SOLN
Freq: Three times a day (TID) | INTRAVENOUS | Status: DC
  Filled 2020-11-16 (×7): qty 50

## 2020-11-16 MED ORDER — IPRATROPIUM-ALBUTEROL 0.5-2.5 (3) MG/3ML IN SOLN
3.0000 mL | Freq: Four times a day (QID) | RESPIRATORY_TRACT | Status: DC | PRN
  Administered 2020-11-16 – 2020-12-07 (×5): 3 mL via RESPIRATORY_TRACT
  Filled 2020-11-16 (×6): qty 3

## 2020-11-16 MED ORDER — CHLORDIAZEPOXIDE HCL 25 MG PO CAPS
25.0000 mg | ORAL_CAPSULE | Freq: Three times a day (TID) | ORAL | Status: DC | PRN
  Administered 2020-11-16 – 2020-11-17 (×2): 25 mg via ORAL
  Filled 2020-11-16 (×2): qty 1

## 2020-11-16 MED ORDER — PANTOPRAZOLE SODIUM 40 MG PO TBEC
40.0000 mg | DELAYED_RELEASE_TABLET | Freq: Every day | ORAL | Status: DC
  Administered 2020-11-16 – 2020-11-17 (×2): 40 mg via ORAL
  Filled 2020-11-16 (×2): qty 1

## 2020-11-16 MED ORDER — AMOXICILLIN-POT CLAVULANATE 875-125 MG PO TABS
1.0000 | ORAL_TABLET | Freq: Two times a day (BID) | ORAL | Status: DC
  Administered 2020-11-16: 1 via ORAL
  Filled 2020-11-16 (×3): qty 1

## 2020-11-16 MED ORDER — PREDNISONE 20 MG PO TABS
20.0000 mg | ORAL_TABLET | Freq: Two times a day (BID) | ORAL | Status: DC
  Administered 2020-11-16 – 2020-11-17 (×3): 20 mg via ORAL
  Filled 2020-11-16 (×3): qty 1

## 2020-11-16 MED ORDER — FOLIC ACID 1 MG PO TABS
1.0000 mg | ORAL_TABLET | Freq: Every day | ORAL | Status: DC
  Administered 2020-11-16 – 2020-11-18 (×3): 1 mg via ORAL
  Filled 2020-11-16 (×4): qty 1

## 2020-11-16 MED ORDER — AZITHROMYCIN 500 MG PO TABS
500.0000 mg | ORAL_TABLET | Freq: Every day | ORAL | Status: AC
  Administered 2020-11-16: 500 mg via ORAL
  Filled 2020-11-16: qty 1

## 2020-11-16 MED ORDER — AZITHROMYCIN 250 MG PO TABS
250.0000 mg | ORAL_TABLET | Freq: Every day | ORAL | Status: DC
  Filled 2020-11-16: qty 1

## 2020-11-16 MED ORDER — IPRATROPIUM-ALBUTEROL 0.5-2.5 (3) MG/3ML IN SOLN
3.0000 mL | RESPIRATORY_TRACT | Status: DC
  Administered 2020-11-16 – 2020-11-18 (×10): 3 mL via RESPIRATORY_TRACT
  Filled 2020-11-16 (×10): qty 3

## 2020-11-16 MED ORDER — BUDESONIDE 0.25 MG/2ML IN SUSP
0.2500 mg | Freq: Two times a day (BID) | RESPIRATORY_TRACT | Status: DC
  Administered 2020-11-16 – 2020-12-07 (×40): 0.25 mg via RESPIRATORY_TRACT
  Filled 2020-11-16 (×43): qty 2
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
## 2020-11-16 NOTE — Progress Notes (Signed)
Patient accepted from PCCM.  [facility] will be attending on 11/17/2020 at 7 AM.

## 2020-11-16 NOTE — Progress Notes (Addendum)
Plan of Care 1.started steroids PRED 20 daily 2.dounbes every 4 hrs, pulmicort nebs BID 3.Augmentin  and Z pak 4.midodrine 5.Consult to Palliative 6.SD status 7.start LIBRIUM 8.Transfer to [facility]

## 2020-11-16 NOTE — Progress Notes (Signed)
0800 report taken from night RN patient confused trying to get out of bed pulled out foley this morning. Complete ADL care provided AM meds given patient has blood coming from penis cleansed and placed external device for urine on patient safety mits placed on patient to help with pulling on lines. KVO IV line and [place] also on patient 0900 patient total assisted with breakfast ate min. Spoke with patients son via phone, son unaware of current medical conditions will come in later today.

## 2020-11-16 NOTE — Consult Note (Signed)
PHARMACY CONSULT NOTE  Pharmacy Consult for Electrolyte Monitoring and Replacement   Recent Labs: Potassium (mmol/L)  Date Value  11/15/2020 3.9  12/12/2012 4.0   Magnesium (mg/dL)  Date Value  45/99/7741 2.1  12/14/2012 1.9   Calcium (mg/dL)  Date Value  42/39/5320 7.9 (L)   Calcium, Total (mg/dL)  Date Value  23/34/3568 9.9   Albumin (g/dL)  Date Value  61/68/3729 2.8 (L)  12/11/2012 3.5   Sodium (mmol/L)  Date Value  11/15/2020 140  12/13/2012 136   Assessment: Patient is a 65 y/o M with medical history including COPD and liver dysfunction / possible cirrhosis suspected secondary to alcohol abuse who was admitted with sepsis and acute metabolic encephalopathy. Pharmacy consulted to assist with electrolyte monitoring and replacement as indicated.   Noted severe malnutrition per RD assessment  Goal of Therapy:  Electrolytes within normal limits  Plan:  --No electrolyte replacement warranted today --Hypocalcemia possibly secondary to pancreatitis --Will continue to follow  Tressie Ellis 11/16/2020 2:08 PM

## 2020-11-16 NOTE — Progress Notes (Signed)
Attempting to resist medical devices. Trying to getout of bed. Pulled all monitor leads pulse ox and foley catheter. Slight bleeding noted from catheter site. Will continue to observe closely.

## 2020-11-17 ENCOUNTER — Inpatient Hospital Stay: Payer: Medicare Other

## 2020-11-17 DIAGNOSIS — A419 Sepsis, unspecified organism: Secondary | ICD-10-CM | POA: Diagnosis not present

## 2020-11-17 DIAGNOSIS — N179 Acute kidney failure, unspecified: Secondary | ICD-10-CM

## 2020-11-17 DIAGNOSIS — N390 Urinary tract infection, site not specified: Secondary | ICD-10-CM | POA: Diagnosis not present

## 2020-11-17 DIAGNOSIS — E43 Unspecified severe protein-calorie malnutrition: Secondary | ICD-10-CM

## 2020-11-17 DIAGNOSIS — R6521 Severe sepsis with septic shock: Secondary | ICD-10-CM

## 2020-11-17 DIAGNOSIS — R338 Other retention of urine: Secondary | ICD-10-CM | POA: Diagnosis not present

## 2020-11-17 DIAGNOSIS — N139 Obstructive and reflux uropathy, unspecified: Secondary | ICD-10-CM

## 2020-11-17 LAB — PHOSPHORUS: Phosphorus: 2.8 mg/dL (ref 2.5–4.6)

## 2020-11-17 LAB — CBC WITH DIFFERENTIAL/PLATELET
Abs Immature Granulocytes: 0.07 10*3/uL (ref 0.00–0.07)
Basophils Absolute: 0 10*3/uL (ref 0.0–0.1)
Basophils Relative: 0 %
Eosinophils Absolute: 0 10*3/uL (ref 0.0–0.5)
Eosinophils Relative: 0 %
HCT: 32.1 % — ABNORMAL LOW (ref 39.0–52.0)
Hemoglobin: 10.5 g/dL — ABNORMAL LOW (ref 13.0–17.0)
Immature Granulocytes: 1 %
Lymphocytes Relative: 25 %
Lymphs Abs: 1.4 10*3/uL (ref 0.7–4.0)
MCH: 33.2 pg (ref 26.0–34.0)
MCHC: 32.7 g/dL (ref 30.0–36.0)
MCV: 101.6 fL — ABNORMAL HIGH (ref 80.0–100.0)
Monocytes Absolute: 0.4 10*3/uL (ref 0.1–1.0)
Monocytes Relative: 7 %
Neutro Abs: 3.8 10*3/uL (ref 1.7–7.7)
Neutrophils Relative %: 67 %
Platelets: 114 10*3/uL — ABNORMAL LOW (ref 150–400)
RBC: 3.16 MIL/uL — ABNORMAL LOW (ref 4.22–5.81)
RDW: 14.1 % (ref 11.5–15.5)
WBC: 5.7 10*3/uL (ref 4.0–10.5)
nRBC: 0 % (ref 0.0–0.2)

## 2020-11-17 LAB — BASIC METABOLIC PANEL
Anion gap: 6 (ref 5–15)
BUN: 18 mg/dL (ref 8–23)
CO2: 26 mmol/L (ref 22–32)
Calcium: 8.4 mg/dL — ABNORMAL LOW (ref 8.9–10.3)
Chloride: 113 mmol/L — ABNORMAL HIGH (ref 98–111)
Creatinine, Ser: 0.66 mg/dL (ref 0.61–1.24)
GFR, Estimated: >60 mL/min (ref >60)
Glucose, Bld: 127 mg/dL — ABNORMAL HIGH (ref 70–99)
Potassium: 3.9 mmol/L (ref 3.5–5.1)
Sodium: 145 mmol/L (ref 135–145)

## 2020-11-17 LAB — CBC
HCT: 34.1 % — ABNORMAL LOW (ref 39.0–52.0)
Hemoglobin: 11.4 g/dL — ABNORMAL LOW (ref 13.0–17.0)
MCH: 33.1 pg (ref 26.0–34.0)
MCHC: 33.4 g/dL (ref 30.0–36.0)
MCV: 99.1 fL (ref 80.0–100.0)
Platelets: 125 10*3/uL — ABNORMAL LOW (ref 150–400)
RBC: 3.44 MIL/uL — ABNORMAL LOW (ref 4.22–5.81)
RDW: 14.1 % (ref 11.5–15.5)
WBC: 6.7 10*3/uL (ref 4.0–10.5)
nRBC: 0.3 % — ABNORMAL HIGH (ref 0.0–0.2)

## 2020-11-17 LAB — URINE CULTURE: Culture: >=100000 — AB

## 2020-11-17 LAB — GLUCOSE, CAPILLARY
Glucose-Capillary: 128 mg/dL — ABNORMAL HIGH (ref 70–99)
Glucose-Capillary: 112 mg/dL — ABNORMAL HIGH (ref 70–99)
Glucose-Capillary: 122 mg/dL — ABNORMAL HIGH (ref 70–99)
Glucose-Capillary: 121 mg/dL — ABNORMAL HIGH (ref 70–99)
Glucose-Capillary: 118 mg/dL — ABNORMAL HIGH (ref 70–99)
Glucose-Capillary: 121 mg/dL — ABNORMAL HIGH (ref 70–99)

## 2020-11-17 LAB — AMMONIA: Ammonia: 23 umol/L (ref 9–35)

## 2020-11-17 LAB — MAGNESIUM: Magnesium: 2 mg/dL (ref 1.7–2.4)

## 2020-11-17 MED ORDER — LORAZEPAM 2 MG/ML IJ SOLN
1.0000 mg | INTRAMUSCULAR | Status: AC | PRN
Start: 2020-11-17 — End: 2020-11-20
  Administered 2020-11-19: 2 mg via INTRAVENOUS
  Filled 2020-11-17 (×2): qty 1

## 2020-11-17 MED ORDER — ADULT MULTIVITAMIN W/MINERALS CH
1.0000 | ORAL_TABLET | Freq: Every day | ORAL | Status: DC
  Administered 2020-11-18: 1 via ORAL
  Filled 2020-11-17: qty 1

## 2020-11-17 MED ORDER — SODIUM CHLORIDE 0.9 % IV BOLUS
250.0000 mL | Freq: Once | INTRAVENOUS | Status: AC
  Administered 2020-11-17: 250 mL via INTRAVENOUS

## 2020-11-17 MED ORDER — FOLIC ACID 1 MG PO TABS: 1.0000 mg | ORAL_TABLET | Freq: Every day | ORAL | Status: DC

## 2020-11-17 MED ORDER — LORAZEPAM 2 MG/ML IJ SOLN
0.0000 mg | INTRAMUSCULAR | Status: AC
  Administered 2020-11-17 – 2020-11-18 (×6): 2 mg via INTRAVENOUS
  Filled 2020-11-17 (×5): qty 1

## 2020-11-17 MED ORDER — THIAMINE HCL 100 MG PO TABS
100.0000 mg | ORAL_TABLET | Freq: Every day | ORAL | Status: DC
Start: 2020-11-20 — End: 2020-11-17

## 2020-11-17 MED ORDER — THIAMINE HCL 100 MG/ML IJ SOLN: 500.0000 mg | Freq: Three times a day (TID) | INTRAVENOUS | Status: DC

## 2020-11-17 MED ORDER — SODIUM CHLORIDE 0.9 % IV SOLN
3.0000 g | Freq: Four times a day (QID) | INTRAVENOUS | Status: DC
  Administered 2020-11-17 – 2020-11-20 (×10): 3 g via INTRAVENOUS
  Filled 2020-11-17: qty 8
  Filled 2020-11-17 (×3): qty 3
  Filled 2020-11-17 (×3): qty 8
  Filled 2020-11-17 (×2): qty 3
  Filled 2020-11-17: qty 8
  Filled 2020-11-17: qty 3
  Filled 2020-11-17: qty 8

## 2020-11-17 MED ORDER — LORAZEPAM 2 MG/ML IJ SOLN
1.0000 mg | Freq: Once | INTRAMUSCULAR | Status: AC
  Administered 2020-11-17: 1 mg via INTRAVENOUS
  Filled 2020-11-17: qty 1

## 2020-11-17 MED ORDER — LORAZEPAM 1 MG PO TABS
1.0000 mg | ORAL_TABLET | ORAL | Status: AC | PRN
Start: 2020-11-17 — End: 2020-11-20

## 2020-11-17 MED ORDER — LORAZEPAM 2 MG/ML IJ SOLN: 0.0000 mg | Freq: Three times a day (TID) | INTRAMUSCULAR | Status: AC

## 2020-11-17 MED ORDER — THIAMINE HCL 100 MG/ML IJ SOLN
250.0000 mg | INTRAMUSCULAR | Status: AC
  Administered 2020-11-19 – 2020-11-24 (×5): 250 mg via INTRAVENOUS
  Filled 2020-11-17 (×6): qty 2.5

## 2020-11-17 MED ORDER — LACTULOSE 10 GM/15ML PO SOLN
20.0000 g | Freq: Two times a day (BID) | ORAL | Status: DC
  Administered 2020-11-17: 20 g via ORAL
  Filled 2020-11-17: qty 30

## 2020-11-17 MED ORDER — THIAMINE HCL 100 MG/ML IJ SOLN
500.0000 mg | Freq: Three times a day (TID) | INTRAVENOUS | Status: AC
  Administered 2020-11-17 – 2020-11-19 (×6): 500 mg via INTRAVENOUS
  Filled 2020-11-17 (×6): qty 5

## 2020-11-17 MED ORDER — PANTOPRAZOLE SODIUM 40 MG IV SOLR
40.0000 mg | INTRAVENOUS | Status: DC
  Administered 2020-11-17 – 2020-11-30 (×14): 40 mg via INTRAVENOUS
  Filled 2020-11-17 (×14): qty 40

## 2020-11-17 MED ORDER — SODIUM CHLORIDE 0.9 % IV SOLN
500.0000 mg | INTRAVENOUS | Status: DC
  Administered 2020-11-17 – 2020-11-19 (×3): 500 mg via INTRAVENOUS
  Filled 2020-11-17 (×3): qty 500

## 2020-11-17 MED ORDER — THIAMINE HCL 100 MG/ML IJ SOLN: 100.0000 mg | Freq: Every day | INTRAMUSCULAR | Status: DC

## 2020-11-17 NOTE — Consult Note (Signed)
Urology consult note  Requesting physician: Dr. Kerry Hough  Reason for consultation: Urinary retention  HPI: Dustin Vazquez is a 65 y.o. male admitted to [facility] after found on the side of the road with altered mental status.  He was noted to be hypotensive and hypothermic and felt to be in septic shock secondary to UTI  He has been confused and had an indwelling Foley however pulled this out a.m. 11/16/2020  He has been noted to have oozing from the urethra since his catheter was traumatically removed.  A bladder scan this afternoon showed >766 mL of urine.  A Foley catheter could not be advanced beyond the prostate with return of 20-30 cc of frank blood.  Review of systems: Not obtainable  Exam: GEN: Confused mild agitation Abdomen: Soft with bladder palpable above the symphysis GU: Phallus retracted, old blood at meatus  His external genitalia were prepped and draped.  A 14 French Coloplast coud catheter was advanced into the bladder without difficulty.  There was a small blood clot obtained initially with return of amber urine without gross hematuria.  Only 5 mL of sterile water was placed in the balloon and the catheter was placed to gravity drainage  Impression: Traumatic removal of Foley catheter Urinary retention Coud catheter placed without difficulty No gross blood returned on catheter placement 3.  Urethral bleeding Secondary to to traumatic Foley removal and Foley trauma  Recommendation: Leave Foley catheter indwelling If he does recover can remove when awake/alert   Irineo Axon, MD

## 2020-11-17 NOTE — Consult Note (Signed)
PHARMACY CONSULT NOTE  Pharmacy Consult for Electrolyte Monitoring and Replacement   Recent Labs: Potassium (mmol/L)  Date Value  11/17/2020 3.9  12/12/2012 4.0   Magnesium (mg/dL)  Date Value  06/77/0340 2.0  12/14/2012 1.9   Calcium (mg/dL)  Date Value  35/24/8185 8.4 (L)   Calcium, Total (mg/dL)  Date Value  90/93/1121 9.9   Albumin (g/dL)  Date Value  62/44/6950 2.8 (L)  12/11/2012 3.5   Phosphorus (mg/dL)  Date Value  72/25/7505 2.8   Sodium (mmol/L)  Date Value  11/17/2020 145  12/13/2012 136   Assessment: Patient is a 65 y/o M with medical history including COPD and liver dysfunction / possible cirrhosis suspected secondary to alcohol abuse who was admitted with sepsis and acute metabolic encephalopathy. Pharmacy consulted to assist with electrolyte monitoring and replacement as indicated.   Noted severe malnutrition per RD assessment  Goal of Therapy:  Electrolytes within normal limits  Plan:  --No electrolyte replacement warranted today --Will continue to follow  Tressie Ellis 11/17/2020 8:20 AM

## 2020-11-17 NOTE — Progress Notes (Signed)
Palliative:  Consult received. Chart reviewed. Visited patient - patient nonverbal, confused. Received update from RN. Patient unable to take PO meds d/t AMS. RN reports no family at bedside but she did have number for a son ?[place] at [phone]. Called number with no answer, voice mail left with call back number. Per TOC note, number listed for patient's niece is not accurate.   Unfortunately, patient currently unable to participate in GOC discussions and contact with family has not been established.   Will follow along - await call back from son. Will continue attempts to get in touch with him. If contact with other family members is established will follow up for goals of care discussions. Will discuss GOC with patient if mental status clears.   Gerlean Ren, DNP, AGNP-C Palliative Medicine Team Team Phone # [phone]  Pager # [phone]  NO CHARGE

## 2020-11-17 NOTE — Consult Note (Signed)
Pharmacy Antibiotic Note  Dustin Vazquez is a 65 y.o. male admitted on 11/14/2020 with or aspiration pneumonia. Pharmacy has been consulted for Unasyn dosing.  Patient with PMH including COPD, liver dysfunction/possible cirrhosis suspected secondary to alcohol abuse initially presented with sepsis and acute metabolic encephalopathy with concern UTI. Treated with ceftriaxone and azithromycin x 2 days. Urine culture grew Staph epi. Today, patient was tachypneic with elevated RR, tachycardic, and hypotensive.   Plan: Start Unasyn (ampicillin/sulbactam) 3 grams every 6 hours. Monitor WBC, respiratory rate/O2 sat, clinical course, LOT.  Height: 5\' 6"  (167.6 cm) Weight: 63.5 kg (140 lb) IBW/kg (Calculated) : 63.8  Temp (24hrs), Avg:97.7 F (36.5 C), Min:97.2 F (36.2 C), Max:98.4 F (36.9 C)  Recent Labs  Lab 11/14/20 2008 11/14/20 2148 11/15/20 0150 11/15/20 0529 11/17/20 0641 11/17/20 1554  WBC 13.4*  --   --  13.2* 5.7 6.7  CREATININE 2.07*  --   --  1.48* 0.66  --   LATICACIDVEN 3.7* 2.5* 2.4*  --   --   --     Estimated Creatinine Clearance: 81.6 mL/min (by C-G formula based on SCr of 0.66 mg/dL).    Allergies  Allergen Reactions   Trazodone Anxiety    Antimicrobials this admission: ceftriaxone 9/11 >> 9/12 azithromycin 9/13 >>  Unasyn 9/14 >>  Dose adjustments this admission: None  Microbiology results: 9/12 BCx: ip 9/11 UCx: Staph epi  9/12 MRSA PCR: not detected  Thank you for allowing pharmacy to be a part of this patient's care.   11/12, PharmD Pharmacy Resident  11/17/2020 5:45 PM

## 2020-11-17 NOTE — Progress Notes (Signed)
Pt lethargic and disoriented this AM. Not following commands. Took some of his PO medications in applesauce fine but began to cough after lactulose administration. R pupil is noticeably larger than left pupil. MD made aware. Ativan administered prior to head CT. MAP goal >60. VSS. Pt has not voided this shift. Bladder scan shows >766cc in bladder. MD made aware. In and out catheter ordered. Blood noted on tip of penis. Frank red blood returned approximately 20 cc with in and out cath but unable to push in and out catheter further. MD made aware of frank red blood return and inability to drain bladder. New orders received and Urology consulted per MD. Will continue to monitor.

## 2020-11-17 NOTE — Progress Notes (Signed)
PROGRESS NOTE    Dustin Vazquez  ZOX:[id number] DOB: [birth year] DOA: 11/14/2020 PCP: [facility]    Brief Narrative:  65 year old male is brought to the hospital after he was found on the side of the road with altered mental status.  Noted to be hypotensive, hypothermic on arrival with elevated creatinine, liver enzymes and lactic acid.  Noted to have urinary tract infection and was admitted to the hospital for septic shock requiring vasopressors.  He was admitted to the ICU for further management.  Transferred to [facility] on 9/14   Assessment & Plan:   Active Problems:   Severe sepsis (HCC)   Severe sepsis with septic shock (CODE) (HCC)   Protein-calorie malnutrition, severe   Sepsis with septic shock secondary to urinary tract infection -He has been weaned off of vasopressors -Continue on IV antibiotics -Urine culture positive for staph epidermidis -Continues to have periods of hypotension requiring midodrine -Follow-up blood cultures  Acute metabolic encephalopathy -Possibly related to sepsis -He does have a history of alcoholism and may have a component of Wernicke's, on high-dose thiamine -Ammonia level normal today -CT head negative x2 -If he fails to improve, may need MRI/EEG  History of alcohol abuse -Continue on CIWA protocol  Alcoholic cirrhosis -Continue to follow LFTs -No evidence of ascites at this time  Possible aspiration -Noted to have coughing/rhonchi after taking p.o. meds -Once mental status has improved, can consider swallow evaluation -Check chest x-ray -Continue antibiotics  Acute kidney injury -Secondary to hypovolemia -Overall renal function has improved with IV hydration  Urinary retention -Noted to have significant hematuria -Urology consulted  COPD -No wheezing at this time -Continue bronchodilators and inhaled steroids  Goals of care -Patient was living independently in a camper -Discussed CODE STATUS with patient's  son who confirmed DNR -He is overall malnourished and may have difficulty making a meaningful recovery -Palliative care consulted to further address goals of care   DVT prophylaxis:   SCDs  Code Status: DNR, confirmed with patient's son Family Communication: Updated patient's son over the phone Disposition Plan: Status is: Inpatient  Remains inpatient appropriate because:Hemodynamically unstable, Altered mental status, IV treatments appropriate due to intensity of illness or inability to take PO, and Inpatient level of care appropriate due to severity of illness  Dispo: The patient is from: Home              Anticipated d/c is to:  TBD              Patient currently is not medically stable to d/c.   Difficult to place patient No         Consultants:  PCCM urology  Procedures:    Antimicrobials:  Unasyn 9/14 Azithromycin 9/13>    Subjective: Patient is somnolent, replies yes and no to questions, although likely unreliable  Objective: Vitals:   11/17/20 1700 11/17/20 1800 11/17/20 1900 11/17/20 1921  BP: (!) 71/57 (!) 104/49 (!) 118/98   Pulse: 90 87 97   Resp:   16   Temp:      TempSrc:      SpO2: 97% 97% 100% 98%  Weight:      Height:        Intake/Output Summary (Last 24 hours) at 11/17/2020 2047 Last data filed at 11/17/2020 1836 Gross per 24 hour  Intake 491.58 ml  Output 170 ml  Net 321.58 ml   Filed Weights   11/14/20 2052  Weight: 63.5 kg    Examination:  General  exam: Patient is fidgety, does not answer questions HEENT: Right pupil more dilated than left Respiratory system: Clear to auscultation. Respiratory effort normal. Cardiovascular system: S1 & S2 heard, RRR. No JVD, murmurs, rubs, gallops or clicks. No pedal edema. Gastrointestinal system: Abdomen is nondistended, soft and nontender. No organomegaly or masses felt. Normal bowel sounds heard. Central nervous system:No focal neurological deficits. Extremities: Symmetric 5 x 5  power. Skin: No rashes, lesions or ulcers Psychiatry: Nonverbal, agitated    Data Reviewed: I have personally reviewed following labs and imaging studies  CBC: Recent Labs  Lab 11/14/20 2008 11/15/20 0529 11/17/20 0641 11/17/20 1554  WBC 13.4* 13.2* 5.7 6.7  NEUTROABS 10.9*  --  3.8  --   HGB 14.4 12.8* 10.5* 11.4*  HCT 40.0 35.5* 32.1* 34.1*  MCV 92.6 94.9 101.6* 99.1  PLT 216 179 114* 125*   Basic Metabolic Panel: Recent Labs  Lab 11/14/20 2008 11/14/20 2044 11/15/20 0529 11/17/20 0641  NA 139  --  140 145  K 3.1*  --  3.9 3.9  CL 98  --  110 113*  CO2 26  --  20* 26  GLUCOSE 95  --  102* 127*  BUN 41*  --  33* 18  CREATININE 2.07*  --  1.48* 0.66  CALCIUM 9.2  --  7.9* 8.4*  MG  --  2.1  --  2.0  PHOS  --   --   --  2.8   GFR: Estimated Creatinine Clearance: 81.6 mL/min (by C-G formula based on SCr of 0.66 mg/dL). Liver Function Tests: Recent Labs  Lab 11/14/20 2008  AST 89*  ALT 34  ALKPHOS 74  BILITOT 2.5*  PROT 6.8  ALBUMIN 2.8*   Recent Labs  Lab 11/14/20 2008  LIPASE 141*   Recent Labs  Lab 11/16/20 1139 11/17/20 0838  AMMONIA 63* 23   Coagulation Profile: Recent Labs  Lab 11/14/20 2008 11/15/20 0529  INR 1.5* 1.5*   Cardiac Enzymes: Recent Labs  Lab 11/14/20 2044  CKTOTAL 258   BNP (last 3 results) No results for input(s): PROBNP in the last 8760 hours. HbA1C: No results for input(s): HGBA1C in the last 72 hours. CBG: Recent Labs  Lab 11/17/20 0344 11/17/20 0722 11/17/20 1120 11/17/20 1612 11/17/20 1914  GLUCAP 128* 112* 122* 121* 118*   Lipid Profile: No results for input(s): CHOL, HDL, LDLCALC, TRIG, CHOLHDL, LDLDIRECT in the last 72 hours. Thyroid Function Tests: Recent Labs    11/15/20 0529  TSH 0.742   Anemia Panel: Recent Labs    11/16/20 1139  VITAMINB12 2,270*   Sepsis Labs: Recent Labs  Lab 11/14/20 2008 11/14/20 2148 11/15/20 0150 11/15/20 0529  PROCALCITON  --   --   --  0.43   LATICACIDVEN 3.7* 2.5* 2.4*  --     Recent Results (from the past 240 hour(s))  Blood culture (routine single)     Status: None (Preliminary result)   Collection Time: 11/14/20  8:08 PM   Specimen: BLOOD  Result Value Ref Range Status   Specimen Description BLOOD RIGHT ANTECUBITAL  Final   Special Requests   Final    BOTTLES DRAWN AEROBIC AND ANAEROBIC Blood Culture adequate volume   Culture   Final    NO GROWTH 3 DAYS Performed at [facility], [address], [place], [place] [address]    Report Status PENDING  Incomplete  Urine Culture     Status: Abnormal   Collection Time: 11/14/20  8:08 PM  Specimen: Urine, Random  Result Value Ref Range Status   Specimen Description   Final    URINE, RANDOM Performed at [facility], [address] [place], [place] [address]    Special Requests   Final    NONE Performed at [facility], [address] [place], [place] [address]    Culture >=100,000 COLONIES/mL STAPHYLOCOCCUS EPIDERMIDIS (A)  Final   Report Status 11/17/2020 FINAL  Final   Organism ID, Bacteria STAPHYLOCOCCUS EPIDERMIDIS (A)  Final      Susceptibility   Staphylococcus epidermidis - MIC*    CIPROFLOXACIN 2 INTERMEDIATE Intermediate     GENTAMICIN <=0.5 SENSITIVE Sensitive     NITROFURANTOIN <=16 SENSITIVE Sensitive     OXACILLIN <=0.25 SENSITIVE Sensitive     TETRACYCLINE <=1 SENSITIVE Sensitive     VANCOMYCIN 2 SENSITIVE Sensitive     TRIMETH/SULFA 80 RESISTANT Resistant     CLINDAMYCIN <=0.25 SENSITIVE Sensitive     RIFAMPIN <=0.5 SENSITIVE Sensitive     Inducible Clindamycin NEGATIVE Sensitive     * >=100,000 COLONIES/mL STAPHYLOCOCCUS EPIDERMIDIS  Resp Panel by RT-PCR (Flu A&B, Covid) Nasopharyngeal Swab     Status: None   Collection Time: 11/14/20  9:45 PM   Specimen: Nasopharyngeal Swab; Nasopharyngeal(NP) swabs in vial transport medium  Result Value Ref Range Status   SARS Coronavirus 2 by RT PCR NEGATIVE NEGATIVE Final     Comment: (NOTE) SARS-CoV-2 target nucleic acids are NOT DETECTED.  The SARS-CoV-2 RNA is generally detectable in upper respiratory specimens during the acute phase of infection. The lowest concentration of SARS-CoV-2 viral copies this assay can detect is 138 copies/mL. A negative result does not preclude SARS-Cov-2 infection and should not be used as the sole basis for treatment or other patient management decisions. A negative result may occur with  improper specimen collection/handling, submission of specimen other than nasopharyngeal swab, presence of viral mutation(s) within the areas targeted by this assay, and inadequate number of viral copies(<138 copies/mL). A negative result must be combined with clinical observations, patient history, and epidemiological information. The expected result is Negative.  Fact Sheet for Patients:  BloggerCourse.com  Fact Sheet for Healthcare Providers:  SeriousBroker.it  This test is no t yet approved or cleared by the [place] FDA and  has been authorized for detection and/or diagnosis of SARS-CoV-2 by FDA under an Emergency Use Authorization (EUA). This EUA will remain  in effect (meaning this test can be used) for the duration of the COVID-19 declaration under Section 564(b)(1) of the Act, 21 U.S.C.section 360bbb-3(b)(1), unless the authorization is terminated  or revoked sooner.       Influenza A by PCR NEGATIVE NEGATIVE Final   Influenza B by PCR NEGATIVE NEGATIVE Final    Comment: (NOTE) The Xpert Xpress SARS-CoV-2/FLU/RSV plus assay is intended as an aid in the diagnosis of influenza from Nasopharyngeal swab specimens and should not be used as a sole basis for treatment. Nasal washings and aspirates are unacceptable for Xpert Xpress SARS-CoV-2/FLU/RSV testing.  Fact Sheet for Patients: BloggerCourse.com  Fact Sheet for Healthcare  Providers: SeriousBroker.it  This test is not yet approved or cleared by the [place] FDA and has been authorized for detection and/or diagnosis of SARS-CoV-2 by FDA under an Emergency Use Authorization (EUA). This EUA will remain in effect (meaning this test can be used) for the duration of the COVID-19 declaration under Section 564(b)(1) of the Act, 21 U.S.C. section 360bbb-3(b)(1), unless the authorization is terminated or revoked.  Performed  at [facility], [address] [place], [place] [address]   Culture, blood (single) w Reflex to ID Panel     Status: None (Preliminary result)   Collection Time: 11/15/20  1:50 AM   Specimen: BLOOD  Result Value Ref Range Status   Specimen Description BLOOD BLOOD RIGHT HAND  Final   Special Requests   Final    BOTTLES DRAWN AEROBIC AND ANAEROBIC Blood Culture adequate volume   Culture   Final    NO GROWTH 2 DAYS Performed at [facility], [address] [place], [place] [address]    Report Status PENDING  Incomplete  MRSA Next Gen by PCR, Nasal     Status: None   Collection Time: 11/15/20  1:54 AM   Specimen: Nasal Mucosa; Nasal Swab  Result Value Ref Range Status   MRSA by PCR Next Gen NOT DETECTED NOT DETECTED Final    Comment: (NOTE) The GeneXpert MRSA Assay (FDA approved for NASAL specimens only), is one component of a comprehensive MRSA colonization surveillance program. It is not intended to diagnose MRSA infection nor to guide or monitor treatment for MRSA infections. Test performance is not FDA approved in patients less than 8 years old. Performed at [facility] [place] [facility], [address] [place], [place] [address]          Radiology Studies: CT HEAD WO CONTRAST ( )  Result Date: 11/17/2020 CLINICAL DATA:  Mental status changes, unknown cause EXAM: CT HEAD WITHOUT CONTRAST TECHNIQUE: Contiguous axial images were obtained from the base of the skull through the  vertex without intravenous contrast. COMPARISON:  11/15/2020 FINDINGS: Brain: There is atrophy and chronic small vessel disease changes. No acute intracranial abnormality. Specifically, no hemorrhage, hydrocephalus, mass lesion, acute infarction, or significant intracranial injury. Vascular: No hyperdense vessel or unexpected calcification. Skull: No acute calvarial abnormality. Sinuses/Orbits: No acute findings Other: None IMPRESSION: Atrophy, chronic microvascular disease. No acute intracranial abnormality. Electronically Signed   By: Charlett Nose M.D.   On: 11/17/2020 09:52        Scheduled Meds:  budesonide (PULMICORT) nebulizer solution  0.25 mg Nebulization BID   Chlorhexidine Gluconate Cloth  6 each Topical Daily   feeding supplement  237 mL Oral BID BM   folic acid  1 mg Oral Daily   ipratropium-albuterol  3 mL Nebulization Q4H   LORazepam  0-4 mg Intravenous Q4H   Followed by   Melene Muller ON 11/19/2020] LORazepam  0-4 mg Intravenous Q8H   midodrine  10 mg Oral TID WC   multivitamin with minerals  1 tablet Oral Daily   pantoprazole (PROTONIX) IV  40 mg Intravenous Q24H   Continuous Infusions:  sodium chloride 250 mL (11/17/20 1235)   ampicillin-sulbactam (UNASYN) IV 3 g (11/17/20 1945)   azithromycin 250 mL/hr at 11/17/20 1836   thiamine injection     Followed by   Melene Muller ON 11/19/2020] thiamine injection       LOS: 3 days    Time spent: 35 mins    Erick Blinks, MD [facility]   If 7PM-7AM, please contact night-coverage [email or web address]  11/17/2020, 8:47 PM

## 2020-11-18 ENCOUNTER — Inpatient Hospital Stay: Payer: Medicare Other

## 2020-11-18 DIAGNOSIS — R6521 Severe sepsis with septic shock: Secondary | ICD-10-CM | POA: Diagnosis not present

## 2020-11-18 DIAGNOSIS — N139 Obstructive and reflux uropathy, unspecified: Secondary | ICD-10-CM | POA: Diagnosis not present

## 2020-11-18 DIAGNOSIS — E43 Unspecified severe protein-calorie malnutrition: Secondary | ICD-10-CM | POA: Diagnosis not present

## 2020-11-18 LAB — GLUCOSE, CAPILLARY
Glucose-Capillary: 100 mg/dL — ABNORMAL HIGH (ref 70–99)
Glucose-Capillary: 70 mg/dL (ref 70–99)
Glucose-Capillary: 81 mg/dL (ref 70–99)
Glucose-Capillary: 81 mg/dL (ref 70–99)
Glucose-Capillary: 82 mg/dL (ref 70–99)
Glucose-Capillary: 98 mg/dL (ref 70–99)

## 2020-11-18 LAB — PHOSPHORUS: Phosphorus: 2.6 mg/dL (ref 2.5–4.6)

## 2020-11-18 LAB — COMPREHENSIVE METABOLIC PANEL
ALT: 35 U/L (ref 0–44)
AST: 69 U/L — ABNORMAL HIGH (ref 15–41)
Albumin: 2.2 g/dL — ABNORMAL LOW (ref 3.5–5.0)
Alkaline Phosphatase: 59 U/L (ref 38–126)
Anion gap: 7 (ref 5–15)
BUN: 14 mg/dL (ref 8–23)
CO2: 24 mmol/L (ref 22–32)
Calcium: 8.3 mg/dL — ABNORMAL LOW (ref 8.9–10.3)
Chloride: 116 mmol/L — ABNORMAL HIGH (ref 98–111)
Creatinine, Ser: 0.57 mg/dL — ABNORMAL LOW (ref 0.61–1.24)
GFR, Estimated: >60 mL/min (ref >60)
Glucose, Bld: 98 mg/dL (ref 70–99)
Potassium: 3.5 mmol/L (ref 3.5–5.1)
Sodium: 147 mmol/L — ABNORMAL HIGH (ref 135–145)
Total Bilirubin: 0.8 mg/dL (ref 0.3–1.2)
Total Protein: 5.4 g/dL — ABNORMAL LOW (ref 6.5–8.1)

## 2020-11-18 LAB — CBC
HCT: 31.1 % — ABNORMAL LOW (ref 39.0–52.0)
Hemoglobin: 10.3 g/dL — ABNORMAL LOW (ref 13.0–17.0)
MCH: 33.1 pg (ref 26.0–34.0)
MCHC: 33.1 g/dL (ref 30.0–36.0)
MCV: 100 fL (ref 80.0–100.0)
Platelets: 115 10*3/uL — ABNORMAL LOW (ref 150–400)
RBC: 3.11 MIL/uL — ABNORMAL LOW (ref 4.22–5.81)
RDW: 14.3 % (ref 11.5–15.5)
WBC: 6.4 10*3/uL (ref 4.0–10.5)
nRBC: 0 % (ref 0.0–0.2)

## 2020-11-18 MED ORDER — KCL IN DEXTROSE-NACL 20-5-0.45 MEQ/L-%-% IV SOLN
INTRAVENOUS | Status: DC
  Filled 2020-11-18 (×4): qty 1000

## 2020-11-18 MED ORDER — IPRATROPIUM-ALBUTEROL 0.5-2.5 (3) MG/3ML IN SOLN
3.0000 mL | Freq: Three times a day (TID) | RESPIRATORY_TRACT | Status: DC
  Administered 2020-11-18 – 2020-11-22 (×12): 3 mL via RESPIRATORY_TRACT
  Filled 2020-11-18 (×13): qty 3

## 2020-11-18 NOTE — Consult Note (Signed)
PHARMACY CONSULT NOTE  Pharmacy Consult for Electrolyte Monitoring and Replacement   Recent Labs: Potassium (mmol/L)  Date Value  11/18/2020 3.5  12/12/2012 4.0   Magnesium (mg/dL)  Date Value  22/48/2500 2.0  12/14/2012 1.9   Calcium (mg/dL)  Date Value  37/06/8887 8.3 (L)   Calcium, Total (mg/dL)  Date Value  16/94/5038 9.9   Albumin (g/dL)  Date Value  88/28/0034 2.2 (L)  12/11/2012 3.5   Phosphorus (mg/dL)  Date Value  91/79/1505 2.6   Sodium (mmol/L)  Date Value  11/18/2020 147 (H)  12/13/2012 136   Assessment: Patient is a 65 y/o M with medical history including COPD and liver dysfunction / possible cirrhosis suspected secondary to alcohol abuse who was admitted with sepsis and acute metabolic encephalopathy. Pharmacy consulted to assist with electrolyte monitoring and replacement as indicated.   --Noted severe malnutrition per RD assessment. Suspect ongoing poor PO intake --Medications converted to IV on 9/14. Persistent encephalopathy  Goal of Therapy:  Electrolytes within normal limits  Plan:  --Na 147, hyperchloremia. Defer management to hospitalist --No electrolyte replacement warranted today --Care transferred from PCCM to [facility]. Will discontinue consult at this time. Defer further electrolyte replacement and ordering of labs to hospitalist --Pharmacy will continue to monitor peripherally  Tressie Ellis 11/18/2020 7:42 AM

## 2020-11-18 NOTE — Progress Notes (Signed)
Pt scored a 3 on the RT protocol assessment. Nebulizers will be changed to TID.

## 2020-11-18 NOTE — Progress Notes (Signed)
Palliative:  Chart reviewed. Spoke with RN. Patient remains confused. Not following commands.  Discussed plan of care with Dr. Kerry Hough.  Called son [name] at [phone].  No answer, voice mail left with call back number.  Gerlean Ren, DNP, AGNP-C Palliative Medicine Team Team Phone # [phone]  Pager # [phone]  NO CHARGE

## 2020-11-18 NOTE — Progress Notes (Signed)
PROGRESS NOTE    Dustin Vazquez  JKK:[id number] DOB: 23-Sep-1955 DOA: 11/14/2020 PCP: [facility]    Brief Narrative:  65 year old male is brought to the hospital after he was found on the side of the road with altered mental status.  Noted to be hypotensive, hypothermic on arrival with elevated creatinine, liver enzymes and lactic acid.  Noted to have urinary tract infection and was admitted to the hospital for septic shock requiring vasopressors.  He was admitted to the ICU for further management.  Transferred to [facility] on 9/14   Assessment & Plan:   Active Problems:   Severe sepsis (HCC)   Severe sepsis with septic shock (CODE) (HCC)   Protein-calorie malnutrition, severe   Sepsis with septic shock secondary to urinary tract infection -He has been weaned off of vasopressors -Continue on IV antibiotics -Urine culture positive for staph epidermidis -Continues to have periods of hypotension requiring midodrine -Blood culture did not show any growth as of yet  Acute metabolic encephalopathy -Possibly related to sepsis, may also have a component of alcohol withdrawal -He does have a history of alcoholism and may have a component of Wernicke's, on high-dose thiamine -Ammonia level normal  -CT head negative x2 -MRI brain and EEG have been ordered  History of alcohol abuse -Continue on CIWA protocol  Alcoholic cirrhosis -Continue to follow LFTs -No evidence of ascites at this time  Possible aspiration -Noted to have coughing/rhonchi after taking p.o. meds -Once mental status has improved, can consider swallow evaluation, will keep n.p.o. for now -Chest x-ray without evidence of pneumonia. -Continue antibiotics  Acute kidney injury -Secondary to hypovolemia -Overall renal function has improved with IV hydration  Urinary retention -Noted to have significant hematuria -Urology consulted and coud catheter was placed -Recommended to consider removing catheter  once patient is more awake and alert  COPD -No wheezing at this time -Continue bronchodilators and inhaled steroids  Goals of care -Patient was living independently in a camper -Discussed CODE STATUS with patient's son who confirmed DNR -He is overall malnourished and may have difficulty making a meaningful recovery -Palliative care consulted to further address goals of care   DVT prophylaxis:   SCDs  Code Status: DNR, confirmed with patient's son Family Communication: Updated patient's son over the phone Disposition Plan: Status is: Inpatient  Remains inpatient appropriate because:Hemodynamically unstable, Altered mental status, IV treatments appropriate due to intensity of illness or inability to take PO, and Inpatient level of care appropriate due to severity of illness  Dispo: The patient is from: Home              Anticipated d/c is to:  TBD              Patient currently is not medically stable to d/c.   Difficult to place patient No         Consultants:  PCCM urology  Procedures:    Antimicrobials:  Unasyn 9/14 Azithromycin 9/13>    Subjective: Staff reports that patient was agitated earlier today and required doses of Ativan.  He has not been awake/alert enough to take anything by mouth.  Objective: Vitals:   11/18/20 1700 11/18/20 1800 11/18/20 1943 11/18/20 2000  BP: (!) 109/56 97/71  112/66  Pulse: (!) 55   94  Resp:      Temp:      TempSrc:      SpO2: 98%  96%   Weight:      Height:  Intake/Output Summary (Last 24 hours) at 11/18/2020 2016 Last data filed at 11/18/2020 1800 Gross per 24 hour  Intake 818.54 ml  Output 1190 ml  Net -371.46 ml   Filed Weights   11/14/20 2052  Weight: 63.5 kg    Examination:  General exam: somnolent, recently medicated HEENT: pupils are equal and reactive  Respiratory system: Clear to auscultation. Respiratory effort normal. Cardiovascular system: S1 & S2 heard, RRR. No JVD, murmurs, rubs,  gallops or clicks. No pedal edema. Gastrointestinal system: Abdomen is nondistended, soft and nontender. No organomegaly or masses felt. Normal bowel sounds heard. Central nervous system:No focal neurological deficits. Extremities: Symmetric 5 x 5 power. Skin: No rashes, lesions or ulcers Psychiatry: somnolent    Data Reviewed: I have personally reviewed following labs and imaging studies  CBC: Recent Labs  Lab 11/14/20 2008 11/15/20 0529 11/17/20 0641 11/17/20 1554 11/18/20 0254  WBC 13.4* 13.2* 5.7 6.7 6.4  NEUTROABS 10.9*  --  3.8  --   --   HGB 14.4 12.8* 10.5* 11.4* 10.3*  HCT 40.0 35.5* 32.1* 34.1* 31.1*  MCV 92.6 94.9 101.6* 99.1 100.0  PLT 216 179 114* 125* 115*   Basic Metabolic Panel: Recent Labs  Lab 11/14/20 2008 11/14/20 2044 11/15/20 0529 11/17/20 0641 11/18/20 0254  NA 139  --  140 145 147*  K 3.1*  --  3.9 3.9 3.5  CL 98  --  110 113* 116*  CO2 26  --  20* 26 24  GLUCOSE 95  --  102* 127* 98  BUN 41*  --  33* 18 14  CREATININE 2.07*  --  1.48* 0.66 0.57*  CALCIUM 9.2  --  7.9* 8.4* 8.3*  MG  --  2.1  --  2.0  --   PHOS  --   --   --  2.8 2.6   GFR: Estimated Creatinine Clearance: 81.6 mL/min (A) (by C-G formula based on SCr of 0.57 mg/dL (L)). Liver Function Tests: Recent Labs  Lab 11/14/20 2008 11/18/20 0254  AST 89* 69*  ALT 34 35  ALKPHOS 74 59  BILITOT 2.5* 0.8  PROT 6.8 5.4*  ALBUMIN 2.8* 2.2*   Recent Labs  Lab 11/14/20 2008  LIPASE 141*   Recent Labs  Lab 11/16/20 1139 11/17/20 0838  AMMONIA 63* 23   Coagulation Profile: Recent Labs  Lab 11/14/20 2008 11/15/20 0529  INR 1.5* 1.5*   Cardiac Enzymes: Recent Labs  Lab 11/14/20 2044  CKTOTAL 258   BNP (last 3 results) No results for input(s): PROBNP in the last 8760 hours. HbA1C: No results for input(s): HGBA1C in the last 72 hours. CBG: Recent Labs  Lab 11/17/20 2316 11/18/20 0350 11/18/20 0809 11/18/20 1134 11/18/20 1616  GLUCAP 121* 100* 81 82 81    Lipid Profile: No results for input(s): CHOL, HDL, LDLCALC, TRIG, CHOLHDL, LDLDIRECT in the last 72 hours. Thyroid Function Tests: No results for input(s): TSH, T4TOTAL, FREET4, T3FREE, THYROIDAB in the last 72 hours.  Anemia Panel: Recent Labs    11/16/20 1139  VITAMINB12 2,270*   Sepsis Labs: Recent Labs  Lab 11/14/20 2008 11/14/20 2148 11/15/20 0150 11/15/20 0529  PROCALCITON  --   --   --  0.43  LATICACIDVEN 3.7* 2.5* 2.4*  --     Recent Results (from the past 240 hour(s))  Blood culture (routine single)     Status: None (Preliminary result)   Collection Time: 11/14/20  8:08 PM   Specimen: BLOOD  Result Value Ref Range  Status   Specimen Description BLOOD RIGHT ANTECUBITAL  Final   Special Requests   Final    BOTTLES DRAWN AEROBIC AND ANAEROBIC Blood Culture adequate volume   Culture   Final    NO GROWTH 4 DAYS Performed at [facility], [address] [place], [place] [address]    Report Status PENDING  Incomplete  Urine Culture     Status: Abnormal   Collection Time: 11/14/20  8:08 PM   Specimen: Urine, Random  Result Value Ref Range Status   Specimen Description   Final    URINE, RANDOM Performed at [facility], [address] [place], [place] [address]    Special Requests   Final    NONE Performed at [facility], [address] [place], [place] [address]    Culture >=100,000 COLONIES/mL STAPHYLOCOCCUS EPIDERMIDIS (A)  Final   Report Status 11/17/2020 FINAL  Final   Organism ID, Bacteria STAPHYLOCOCCUS EPIDERMIDIS (A)  Final      Susceptibility   Staphylococcus epidermidis - MIC*    CIPROFLOXACIN 2 INTERMEDIATE Intermediate     GENTAMICIN <=0.5 SENSITIVE Sensitive     NITROFURANTOIN <=16 SENSITIVE Sensitive     OXACILLIN <=0.25 SENSITIVE Sensitive     TETRACYCLINE <=1 SENSITIVE Sensitive     VANCOMYCIN 2 SENSITIVE Sensitive     TRIMETH/SULFA 80 RESISTANT Resistant     CLINDAMYCIN <=0.25 SENSITIVE Sensitive      RIFAMPIN <=0.5 SENSITIVE Sensitive     Inducible Clindamycin NEGATIVE Sensitive     * >=100,000 COLONIES/mL STAPHYLOCOCCUS EPIDERMIDIS  Resp Panel by RT-PCR (Flu A&B, Covid) Nasopharyngeal Swab     Status: None   Collection Time: 11/14/20  9:45 PM   Specimen: Nasopharyngeal Swab; Nasopharyngeal(NP) swabs in vial transport medium  Result Value Ref Range Status   SARS Coronavirus 2 by RT PCR NEGATIVE NEGATIVE Final    Comment: (NOTE) SARS-CoV-2 target nucleic acids are NOT DETECTED.  The SARS-CoV-2 RNA is generally detectable in upper respiratory specimens during the acute phase of infection. The lowest concentration of SARS-CoV-2 viral copies this assay can detect is 138 copies/mL. A negative result does not preclude SARS-Cov-2 infection and should not be used as the sole basis for treatment or other patient management decisions. A negative result may occur with  improper specimen collection/handling, submission of specimen other than nasopharyngeal swab, presence of viral mutation(s) within the areas targeted by this assay, and inadequate number of viral copies(<138 copies/mL). A negative result must be combined with clinical observations, patient history, and epidemiological information. The expected result is Negative.  Fact Sheet for Patients:  BloggerCourse.com  Fact Sheet for Healthcare Providers:  SeriousBroker.it  This test is no t yet approved or cleared by the [place] FDA and  has been authorized for detection and/or diagnosis of SARS-CoV-2 by FDA under an Emergency Use Authorization (EUA). This EUA will remain  in effect (meaning this test can be used) for the duration of the COVID-19 declaration under Section 564(b)(1) of the Act, 21 U.S.C.section 360bbb-3(b)(1), unless the authorization is terminated  or revoked sooner.       Influenza A by PCR NEGATIVE NEGATIVE Final   Influenza B by PCR NEGATIVE NEGATIVE  Final    Comment: (NOTE) The Xpert Xpress SARS-CoV-2/FLU/RSV plus assay is intended as an aid in the diagnosis of influenza from Nasopharyngeal swab specimens and should not be used as a sole basis for treatment. Nasal washings and aspirates are unacceptable for Xpert Xpress SARS-CoV-2/FLU/RSV testing.  Fact  Sheet for Patients: BloggerCourse.com  Fact Sheet for Healthcare Providers: SeriousBroker.it  This test is not yet approved or cleared by the [place] FDA and has been authorized for detection and/or diagnosis of SARS-CoV-2 by FDA under an Emergency Use Authorization (EUA). This EUA will remain in effect (meaning this test can be used) for the duration of the COVID-19 declaration under Section 564(b)(1) of the Act, 21 U.S.C. section 360bbb-3(b)(1), unless the authorization is terminated or revoked.  Performed at [facility], [address] [place], [place] [address]   Culture, blood (single) w Reflex to ID Panel     Status: None (Preliminary result)   Collection Time: 11/15/20  1:50 AM   Specimen: BLOOD  Result Value Ref Range Status   Specimen Description BLOOD BLOOD RIGHT HAND  Final   Special Requests   Final    BOTTLES DRAWN AEROBIC AND ANAEROBIC Blood Culture adequate volume   Culture   Final    NO GROWTH 3 DAYS Performed at [facility], [address] [place], [place] [address]    Report Status PENDING  Incomplete  MRSA Next Gen by PCR, Nasal     Status: None   Collection Time: 11/15/20  1:54 AM   Specimen: Nasal Mucosa; Nasal Swab  Result Value Ref Range Status   MRSA by PCR Next Gen NOT DETECTED NOT DETECTED Final    Comment: (NOTE) The GeneXpert MRSA Assay (FDA approved for NASAL specimens only), is one component of a comprehensive MRSA colonization surveillance program. It is not intended to diagnose MRSA infection nor to guide or monitor treatment for MRSA infections. Test  performance is not FDA approved in patients less than 61 years old. Performed at [facility], [address] [place], [place] [address]          Radiology Studies: CT HEAD WO CONTRAST ( )  Result Date: 11/17/2020 CLINICAL DATA:  Mental status changes, unknown cause EXAM: CT HEAD WITHOUT CONTRAST TECHNIQUE: Contiguous axial images were obtained from the base of the skull through the vertex without intravenous contrast. COMPARISON:  11/15/2020 FINDINGS: Brain: There is atrophy and chronic small vessel disease changes. No acute intracranial abnormality. Specifically, no hemorrhage, hydrocephalus, mass lesion, acute infarction, or significant intracranial injury. Vascular: No hyperdense vessel or unexpected calcification. Skull: No acute calvarial abnormality. Sinuses/Orbits: No acute findings Other: None IMPRESSION: Atrophy, chronic microvascular disease. No acute intracranial abnormality. Electronically Signed   By: Charlett Nose M.D.   On: 11/17/2020 09:52   DG Chest Port 1 View  Result Date: 11/17/2020 CLINICAL DATA:  Cough EXAM: PORTABLE CHEST 1 VIEW COMPARISON:  11/14/2020 FINDINGS: Scarring in the right lung apex. Postsurgical changes in the left upper lobe with volume loss in left hemithorax. No focal consolidation. No pleural effusion or pneumothorax. The heart is normal in size.  Thoracic aortic atherosclerosis. IMPRESSION: No evidence of acute cardiopulmonary disease. Electronically Signed   By: Charline Bills M.D.   On: 11/17/2020 21:17        Scheduled Meds:  budesonide (PULMICORT) nebulizer solution  0.25 mg Nebulization BID   Chlorhexidine Gluconate Cloth  6 each Topical Daily   feeding supplement  237 mL Oral BID BM   folic acid  1 mg Oral Daily   ipratropium-albuterol  3 mL Nebulization TID   LORazepam  0-4 mg Intravenous Q4H   Followed by   Melene Muller ON 11/19/2020] LORazepam  0-4 mg Intravenous Q8H   midodrine  10 mg Oral TID WC   multivitamin with  minerals   1 tablet Oral Daily   pantoprazole (PROTONIX) IV  40 mg Intravenous Q24H   Continuous Infusions:  sodium chloride 250 mL (11/17/20 1235)   ampicillin-sulbactam (UNASYN) IV 3 g (11/18/20 2005)   azithromycin 500 mg (11/18/20 1818)   dextrose 5 % and 0.45 % NaCl with KCl 20 mEq/L     thiamine injection 500 mg (11/18/20 1400)   Followed by   Melene Muller ON 11/19/2020] thiamine injection       LOS: 4 days    Time spent: 35 mins    Erick Blinks, MD [facility]   If 7PM-7AM, please contact night-coverage [email or web address]  11/18/2020, 8:16 PM

## 2020-11-19 ENCOUNTER — Inpatient Hospital Stay: Payer: Medicare Other

## 2020-11-19 DIAGNOSIS — N179 Acute kidney failure, unspecified: Secondary | ICD-10-CM | POA: Diagnosis not present

## 2020-11-19 DIAGNOSIS — E43 Unspecified severe protein-calorie malnutrition: Secondary | ICD-10-CM | POA: Diagnosis not present

## 2020-11-19 DIAGNOSIS — A419 Sepsis, unspecified organism: Secondary | ICD-10-CM | POA: Diagnosis not present

## 2020-11-19 DIAGNOSIS — R6521 Severe sepsis with septic shock: Secondary | ICD-10-CM | POA: Diagnosis not present

## 2020-11-19 DIAGNOSIS — N139 Obstructive and reflux uropathy, unspecified: Secondary | ICD-10-CM | POA: Diagnosis not present

## 2020-11-19 LAB — RENAL FUNCTION PANEL
Albumin: 2.6 g/dL — ABNORMAL LOW (ref 3.5–5.0)
Anion gap: 6 (ref 5–15)
BUN: 13 mg/dL (ref 8–23)
CO2: 23 mmol/L (ref 22–32)
Calcium: 8.4 mg/dL — ABNORMAL LOW (ref 8.9–10.3)
Chloride: 119 mmol/L — ABNORMAL HIGH (ref 98–111)
Creatinine, Ser: 0.91 mg/dL (ref 0.61–1.24)
GFR, Estimated: >60 mL/min (ref >60)
Glucose, Bld: 89 mg/dL (ref 70–99)
Phosphorus: 1.7 mg/dL — ABNORMAL LOW (ref 2.5–4.6)
Potassium: 3.4 mmol/L — ABNORMAL LOW (ref 3.5–5.1)
Sodium: 148 mmol/L — ABNORMAL HIGH (ref 135–145)

## 2020-11-19 LAB — GLUCOSE, CAPILLARY
Glucose-Capillary: 69 mg/dL — ABNORMAL LOW (ref 70–99)
Glucose-Capillary: 171 mg/dL — ABNORMAL HIGH (ref 70–99)
Glucose-Capillary: 91 mg/dL (ref 70–99)
Glucose-Capillary: 113 mg/dL — ABNORMAL HIGH (ref 70–99)
Glucose-Capillary: 104 mg/dL — ABNORMAL HIGH (ref 70–99)
Glucose-Capillary: 86 mg/dL (ref 70–99)
Glucose-Capillary: 92 mg/dL (ref 70–99)

## 2020-11-19 LAB — CBC
HCT: 31 % — ABNORMAL LOW (ref 39.0–52.0)
Hemoglobin: 10.2 g/dL — ABNORMAL LOW (ref 13.0–17.0)
MCH: 34.5 pg — ABNORMAL HIGH (ref 26.0–34.0)
MCHC: 32.9 g/dL (ref 30.0–36.0)
MCV: 104.7 fL — ABNORMAL HIGH (ref 80.0–100.0)
Platelets: 121 10*3/uL — ABNORMAL LOW (ref 150–400)
RBC: 2.96 MIL/uL — ABNORMAL LOW (ref 4.22–5.81)
RDW: 15.4 % (ref 11.5–15.5)
WBC: 18.2 10*3/uL — ABNORMAL HIGH (ref 4.0–10.5)
nRBC: 0.1 % (ref 0.0–0.2)

## 2020-11-19 LAB — CULTURE, BLOOD (SINGLE)
Culture: NO GROWTH
Special Requests: ADEQUATE

## 2020-11-19 MED ORDER — SODIUM CHLORIDE 0.9 % IV BOLUS: 500.0000 mL | Freq: Once | INTRAVENOUS | Status: DC

## 2020-11-19 MED ORDER — VANCOMYCIN HCL 750 MG/150ML IV SOLN
750.0000 mg | Freq: Two times a day (BID) | INTRAVENOUS | Status: DC
  Administered 2020-11-20: 750 mg via INTRAVENOUS
  Filled 2020-11-19 (×2): qty 150

## 2020-11-19 MED ORDER — POTASSIUM PHOSPHATES 15 MMOLE/5ML IV SOLN
30.0000 mmol | Freq: Once | INTRAVENOUS | Status: AC
  Administered 2020-11-19: 30 mmol via INTRAVENOUS
  Filled 2020-11-19: qty 10

## 2020-11-19 MED ORDER — ENOXAPARIN SODIUM 40 MG/0.4ML IJ SOSY
40.0000 mg | PREFILLED_SYRINGE | INTRAMUSCULAR | Status: DC
  Administered 2020-11-19 – 2020-11-20 (×2): 40 mg via SUBCUTANEOUS
  Filled 2020-11-19 (×2): qty 0.4

## 2020-11-19 MED ORDER — ALBUMIN HUMAN 25 % IV SOLN
25.0000 g | Freq: Once | INTRAVENOUS | Status: AC
  Administered 2020-11-19: 25 g via INTRAVENOUS
  Filled 2020-11-19: qty 100

## 2020-11-19 MED ORDER — DEXTROSE 50 % IV SOLN
INTRAVENOUS | Status: AC
  Administered 2020-11-19: 50 mL
  Filled 2020-11-19: qty 50

## 2020-11-19 MED ORDER — LACTATED RINGERS IV BOLUS
1000.0000 mL | Freq: Once | INTRAVENOUS | Status: AC
  Administered 2020-11-19: 1000 mL via INTRAVENOUS

## 2020-11-19 MED ORDER — VANCOMYCIN HCL 1500 MG/300ML IV SOLN
1500.0000 mg | Freq: Once | INTRAVENOUS | Status: AC
  Administered 2020-11-19: 1500 mg via INTRAVENOUS
  Filled 2020-11-19: qty 300

## 2020-11-19 MED ORDER — POTASSIUM CL IN DEXTROSE 5% 20 MEQ/L IV SOLN
20.0000 meq | INTRAVENOUS | Status: AC
Start: 2020-11-19 — End: 2020-11-24
  Administered 2020-11-19 – 2020-11-24 (×7): 20 meq via INTRAVENOUS
  Filled 2020-11-19 (×17): qty 1000

## 2020-11-19 NOTE — Consult Note (Signed)
Pharmacy Antibiotic Note  Dustin Vazquez is a 65 y.o. male admitted on 11/14/2020 with sepsis.  Pharmacy has been consulted for vancomycin dosing.  Plan: Vancomycin 1500mg  IV LD followed by 750mg  IV BID. Desired AUC:500 Estimated: 511 Cmax: 30.5 Cmin:  15.1 Levels to be monitored when indicated  Plan: Height: 5\' 6"  (167.6 cm) Weight: 63.5 kg (140 lb) IBW/kg (Calculated) : 63.8  Temp (24hrs), Avg:99 F (37.2 C), Min:97.7 F (36.5 C), Max:100.2 F (37.9 C)  Recent Labs  Lab 11/14/20 2008 11/14/20 2148 11/15/20 0150 11/15/20 0529 11/17/20 0641 11/17/20 1554 11/18/20 0254 11/19/20 0623  WBC 13.4*  --   --  13.2* 5.7 6.7 6.4 18.2*  CREATININE 2.07*  --   --  1.48* 0.66  --  0.57* 0.91  LATICACIDVEN 3.7* 2.5* 2.4*  --   --   --   --   --     Estimated Creatinine Clearance: 71.7 mL/min (by C-G formula based on SCr of 0.91 mg/dL).    Allergies  Allergen Reactions   Trazodone Anxiety    Antimicrobials this admission: 09/14 Unasyn 3gms IV >> current Azithromycin 500mg  IV>> current  Microbiology results: 9/16 BCx: in process 9/14 UCx: Staph Epi 9/12 MRSA PCR: not detected  Thank you for allowing pharmacy to be a part of this patient's care.  11/19/2020 10:12 AM

## 2020-11-19 NOTE — Progress Notes (Addendum)
0800 patient only responds to pain unable to make needs known concerns with BP and unable to give medication PO due to patient unresponsiveness attending made aware. EEG at bedside patient hasn't had ativan since 2am missed dosage not appropriate to give based on patient unresponsiveness. Iv replaced leaking 1000 Replaced lytes still unable to give anything PO spoke with son via phone updated on patient condition informed him that pallative was trying to reach out and asked that he call them back 1200 holding on bolus per attending BP stable  1300 IV replaced on opposite arm leaking  1600 patient has had no intake PO today

## 2020-11-19 NOTE — Plan of Care (Signed)
  Problem: Education: Goal: Knowledge of General Education information will improve Description: Including pain rating scale, medication(s)/side effects and non-pharmacologic comfort measures Outcome: Not Progressing   Problem: Health Behavior/Discharge Planning: Goal: Ability to manage health-related needs will improve Outcome: Not Progressing   Problem: Clinical Measurements: Goal: Ability to maintain clinical measurements within normal limits will improve Outcome: Not Progressing Goal: Will remain free from infection Outcome: Not Progressing Goal: Diagnostic test results will improve Outcome: Not Progressing Goal: Respiratory complications will improve Outcome: Not Progressing Goal: Cardiovascular complication will be avoided Outcome: Not Progressing   Problem: Activity: Goal: Risk for activity intolerance will decrease Outcome: Not Progressing   Problem: Nutrition: Goal: Adequate nutrition will be maintained Outcome: Not Progressing   Problem: Coping: Goal: Level of anxiety will decrease Outcome: Not Progressing   Problem: Elimination: Goal: Will not experience complications related to bowel motility Outcome: Not Progressing Goal: Will not experience complications related to urinary retention Outcome: Not Progressing   Problem: Pain Managment: Goal: General experience of comfort will improve Outcome: Not Progressing   Problem: Safety: Goal: Ability to remain free from injury will improve Outcome: Not Progressing   Problem: Skin Integrity: Goal: Risk for impaired skin integrity will decrease Outcome: Not Progressing Patient only responds to pain unable to make any needs known unable to have any oral intake due to [facility] total care

## 2020-11-19 NOTE — Procedures (Signed)
Routine EEG Report  Dustin Vazquez is a 65 y.o. male with a history of sepsis who is undergoing an EEG to evaluate for seizures.  Report: This EEG was acquired with electrodes placed according to the International 10-20 electrode system (including Fp1, Fp2, F3, F4, C3, C4, P3, P4, O1, O2, T3, T4, T5, T6, A1, A2, Fz, Cz, Pz). The following electrodes were missing or displaced: none.  The best background was 4-6 Hz. This activity is reactive to stimulation. There was rare sleep architecture noted which manifested as sleep spindles. There was no focal slowing. There were no interictal epileptiform discharges. There were no electrographic seizures identified. Photic stimulation and hyperventilation were not performed.   Impression and clinical correlation: This EEG was obtained while sedated (post ativan administration) with rare sleep architecture and was abnormal due to moderate diffuse slowing indicative of global cerebral dysfunction.   Bing Neighbors, MD [facility] [phone]  If 7pm- 7am, please page neurology on call as listed in AMION.

## 2020-11-19 NOTE — Progress Notes (Signed)
Palliative:  Chart reviewed. Discussed with RN and Dr. Kerry Hough. No changes in patient condition - only response to me was moan to deep sternal rub.   Another attempt to call son  at [phone]. No answer.  Will ask my teammate to follow up Monday.   Gerlean Ren, DNP, AGNP-C Palliative Medicine Team Team Phone # [phone]  Pager # [phone]  NO CHARGE

## 2020-11-19 NOTE — Progress Notes (Signed)
PROGRESS NOTE    Dustin Vazquez  UKG:[id number] DOB: Nov 02, 1955 DOA: 11/14/2020 PCP: [facility]    Brief Narrative:  65 year old male is brought to the hospital after he was found on the side of the road with altered mental status.  Noted to be hypotensive, hypothermic on arrival with elevated creatinine, liver enzymes and lactic acid.  Noted to have urinary tract infection and was admitted to the hospital for septic shock requiring vasopressors.  He was admitted to the ICU for further management.  Transferred to [facility] on 9/14   Assessment & Plan:   Active Problems:   Severe sepsis (HCC)   Severe sepsis with septic shock (CODE) (HCC)   Protein-calorie malnutrition, severe   Sepsis with septic shock secondary to urinary tract infection -He has been weaned off of vasopressors -Continue on IV antibiotics -Urine culture positive for staph epidermidis -Blood culture did not show any growth as of yet  Acute metabolic encephalopathy -Possibly related to sepsis, may also have a component of alcohol withdrawal -He does have a history of alcoholism and may have a component of Wernicke's, on high-dose thiamine -Ammonia level normal  -CT head negative x2 -MRI brain unremarkable -EEG without evidence of epileptiform discharges -He has electrolyte disturbances including hyponatremia which also may be playing a role  Hypernatremia -Related to decreased free water intake -Continue hypotonic fluids  History of alcohol abuse -Continue on CIWA protocol  Alcoholic cirrhosis -Continue to follow LFTs -No evidence of ascites at this time  Possible aspiration -Noted to have coughing/rhonchi after taking p.o. meds -Once mental status has improved, can consider swallow evaluation, will keep n.p.o. for now -Chest x-ray without evidence of pneumonia. -Continue antibiotics -Since patient continues to have fever and has bilateral rhonchi, will repeat chest x-ray today  Acute  kidney injury -Secondary to hypovolemia -Overall renal function has improved with IV hydration  Urinary retention -Noted to have significant hematuria -Urology consulted and coud catheter was placed -Recommended to consider removing catheter once patient is more awake and alert  COPD -No wheezing at this time -Continue bronchodilators and inhaled steroids  Goals of care -Patient was living independently in a camper -Discussed CODE STATUS with patient's son who confirmed DNR -He is overall malnourished and may have difficulty making a meaningful recovery -Palliative care consulted to further address goals of care   DVT prophylaxis: enoxaparin (LOVENOX) injection 40 mg Start: 11/19/20 2200  SCDs  Code Status: DNR, confirmed with patient's son Family Communication: Updated patient's son over the phone Disposition Plan: Status is: Inpatient  Remains inpatient appropriate because:Hemodynamically unstable, Altered mental status, IV treatments appropriate due to intensity of illness or inability to take PO, and Inpatient level of care appropriate due to severity of illness  Dispo: The patient is from: Home              Anticipated d/c is to:  TBD              Patient currently is not medically stable to d/c.   Difficult to place patient No         Consultants:  PCCM Urology Palliative care  Procedures:    Antimicrobials:  Unasyn 9/14 Azithromycin 9/13>    Subjective: He did receive some Ativan overnight, is currently lethargic.  Has had periods of hypotension overnight.  Did have a mild fever overnight.  Continues to have rhonchorous breath sounds.  Objective: Vitals:   11/19/20 1500 11/19/20 1600 11/19/20 1611 11/19/20 1704  BP: 110/83 Marland Kitchen)  134/107 127/65 96/67  Pulse: 93 98  94  Resp:  (!) 23 20 (!) 22  Temp:   98 F (36.7 C)   TempSrc:   Oral   SpO2: 97% 98% 98% 96%  Weight:      Height:        Intake/Output Summary (Last 24 hours) at 11/19/2020  1824 Last data filed at 11/19/2020 1800 Gross per 24 hour  Intake 2399.7 ml  Output 887 ml  Net 1512.7 ml   Filed Weights   11/14/20 2052  Weight: 63.5 kg    Examination:  General exam: Lethargic, does not respond to voice Respiratory system: Bilateral rhonchi. Respiratory effort normal. Cardiovascular system:RRR. No murmurs, rubs, gallops. Gastrointestinal system: Abdomen is nondistended, soft and nontender. No organomegaly or masses felt. Normal bowel sounds heard. Central nervous system: No focal neurological deficits. Extremities: No C/C/E, +pedal pulses Skin: No rashes, lesions or ulcers Psychiatry: Lethargic     Data Reviewed: I have personally reviewed following labs and imaging studies  CBC: Recent Labs  Lab 11/14/20 2008 11/15/20 0529 11/17/20 0641 11/17/20 1554 11/18/20 0254 11/19/20 0623  WBC 13.4* 13.2* 5.7 6.7 6.4 18.2*  NEUTROABS 10.9*  --  3.8  --   --   --   HGB 14.4 12.8* 10.5* 11.4* 10.3* 10.2*  HCT 40.0 35.5* 32.1* 34.1* 31.1* 31.0*  MCV 92.6 94.9 101.6* 99.1 100.0 104.7*  PLT 216 179 114* 125* 115* 121*   Basic Metabolic Panel: Recent Labs  Lab 11/14/20 2008 11/14/20 2044 11/15/20 0529 11/17/20 0641 11/18/20 0254 11/19/20 0623  NA 139  --  140 145 147* 148*  K 3.1*  --  3.9 3.9 3.5 3.4*  CL 98  --  110 113* 116* 119*  CO2 26  --  20* 26 24 23   GLUCOSE 95  --  102* 127* 98 89  BUN 41*  --  33* 18 14 13   CREATININE 2.07*  --  1.48* 0.66 0.57* 0.91  CALCIUM 9.2  --  7.9* 8.4* 8.3* 8.4*  MG  --  2.1  --  2.0  --   --   PHOS  --   --   --  2.8 2.6 1.7*   GFR: Estimated Creatinine Clearance: 71.7 mL/min (by C-G formula based on SCr of 0.91 mg/dL). Liver Function Tests: Recent Labs  Lab 11/14/20 2008 11/18/20 0254 11/19/20 0623  AST 89* 69*  --   ALT 34 35  --   ALKPHOS 74 59  --   BILITOT 2.5* 0.8  --   PROT 6.8 5.4*  --   ALBUMIN 2.8* 2.2* 2.6*   Recent Labs  Lab 11/14/20 2008  LIPASE 141*   Recent Labs  Lab  11/16/20 1139 11/17/20 0838  AMMONIA 63* 23   Coagulation Profile: Recent Labs  Lab 11/14/20 2008 11/15/20 0529  INR 1.5* 1.5*   Cardiac Enzymes: Recent Labs  Lab 11/14/20 2044  CKTOTAL 258   BNP (last 3 results) No results for input(s): PROBNP in the last 8760 hours. HbA1C: No results for input(s): HGBA1C in the last 72 hours. CBG: Recent Labs  Lab 11/19/20 0415 11/19/20 0434 11/19/20 0740 11/19/20 1137 11/19/20 1559  GLUCAP 69* 171* 91 113* 104*   Lipid Profile: No results for input(s): CHOL, HDL, LDLCALC, TRIG, CHOLHDL, LDLDIRECT in the last 72 hours. Thyroid Function Tests: No results for input(s): TSH, T4TOTAL, FREET4, T3FREE, THYROIDAB in the last 72 hours.  Anemia Panel: No results for input(s): VITAMINB12, FOLATE, FERRITIN, TIBC,  IRON, RETICCTPCT in the last 72 hours.  Sepsis Labs: Recent Labs  Lab 11/14/20 2008 11/14/20 2148 11/15/20 0150 11/15/20 0529  PROCALCITON  --   --   --  0.43  LATICACIDVEN 3.7* 2.5* 2.4*  --     Recent Results (from the past 240 hour(s))  Blood culture (routine single)     Status: None   Collection Time: 11/14/20  8:08 PM   Specimen: BLOOD  Result Value Ref Range Status   Specimen Description BLOOD RIGHT ANTECUBITAL  Final   Special Requests   Final    BOTTLES DRAWN AEROBIC AND ANAEROBIC Blood Culture adequate volume   Culture   Final    NO GROWTH 5 DAYS Performed at [facility], [address] [place], [place] [address]    Report Status 11/19/2020 FINAL  Final  Urine Culture     Status: Abnormal   Collection Time: 11/14/20  8:08 PM   Specimen: Urine, Random  Result Value Ref Range Status   Specimen Description   Final    URINE, RANDOM Performed at [facility], [address] [place], [place] [address]    Special Requests   Final    NONE Performed at [facility], [address], [place], [place] [address]    Culture >=100,000 COLONIES/mL STAPHYLOCOCCUS EPIDERMIDIS (A)   Final   Report Status 11/17/2020 FINAL  Final   Organism ID, Bacteria STAPHYLOCOCCUS EPIDERMIDIS (A)  Final      Susceptibility   Staphylococcus epidermidis - MIC*    CIPROFLOXACIN 2 INTERMEDIATE Intermediate     GENTAMICIN <=0.5 SENSITIVE Sensitive     NITROFURANTOIN <=16 SENSITIVE Sensitive     OXACILLIN <=0.25 SENSITIVE Sensitive     TETRACYCLINE <=1 SENSITIVE Sensitive     VANCOMYCIN 2 SENSITIVE Sensitive     TRIMETH/SULFA 80 RESISTANT Resistant     CLINDAMYCIN <=0.25 SENSITIVE Sensitive     RIFAMPIN <=0.5 SENSITIVE Sensitive     Inducible Clindamycin NEGATIVE Sensitive     * >=100,000 COLONIES/mL STAPHYLOCOCCUS EPIDERMIDIS  Resp Panel by RT-PCR (Flu A&B, Covid) Nasopharyngeal Swab     Status: None   Collection Time: 11/14/20  9:45 PM   Specimen: Nasopharyngeal Swab; Nasopharyngeal(NP) swabs in vial transport medium  Result Value Ref Range Status   SARS Coronavirus 2 by RT PCR NEGATIVE NEGATIVE Final    Comment: (NOTE) SARS-CoV-2 target nucleic acids are NOT DETECTED.  The SARS-CoV-2 RNA is generally detectable in upper respiratory specimens during the acute phase of infection. The lowest concentration of SARS-CoV-2 viral copies this assay can detect is 138 copies/mL. A negative result does not preclude SARS-Cov-2 infection and should not be used as the sole basis for treatment or other patient management decisions. A negative result may occur with  improper specimen collection/handling, submission of specimen other than nasopharyngeal swab, presence of viral mutation(s) within the areas targeted by this assay, and inadequate number of viral copies(<138 copies/mL). A negative result must be combined with clinical observations, patient history, and epidemiological information. The expected result is Negative.  Fact Sheet for Patients:  BloggerCourse.com  Fact Sheet for Healthcare Providers:  SeriousBroker.it  This test is  no t yet approved or cleared by the [place] FDA and  has been authorized for detection and/or diagnosis of SARS-CoV-2 by FDA under an Emergency Use Authorization (EUA). This EUA will remain  in effect (meaning this test can be used) for the duration of the COVID-19 declaration under Section 564(b)(1) of the Act, 21 U.S.C.section  360bbb-3(b)(1), unless the authorization is terminated  or revoked sooner.       Influenza A by PCR NEGATIVE NEGATIVE Final   Influenza B by PCR NEGATIVE NEGATIVE Final    Comment: (NOTE) The Xpert Xpress SARS-CoV-2/FLU/RSV plus assay is intended as an aid in the diagnosis of influenza from Nasopharyngeal swab specimens and should not be used as a sole basis for treatment. Nasal washings and aspirates are unacceptable for Xpert Xpress SARS-CoV-2/FLU/RSV testing.  Fact Sheet for Patients: BloggerCourse.com  Fact Sheet for Healthcare Providers: SeriousBroker.it  This test is not yet approved or cleared by the [place] FDA and has been authorized for detection and/or diagnosis of SARS-CoV-2 by FDA under an Emergency Use Authorization (EUA). This EUA will remain in effect (meaning this test can be used) for the duration of the COVID-19 declaration under Section 564(b)(1) of the Act, 21 U.S.C. section 360bbb-3(b)(1), unless the authorization is terminated or revoked.  Performed at [facility], [address] [place], [place] [address]   Culture, blood (single) w Reflex to ID Panel     Status: None (Preliminary result)   Collection Time: 11/15/20  1:50 AM   Specimen: BLOOD  Result Value Ref Range Status   Specimen Description BLOOD BLOOD RIGHT HAND  Final   Special Requests   Final    BOTTLES DRAWN AEROBIC AND ANAEROBIC Blood Culture adequate volume   Culture   Final    NO GROWTH 4 DAYS Performed at [facility], [address], [place], [place] [address]    Report  Status PENDING  Incomplete  MRSA Next Gen by PCR, Nasal     Status: None   Collection Time: 11/15/20  1:54 AM   Specimen: Nasal Mucosa; Nasal Swab  Result Value Ref Range Status   MRSA by PCR Next Gen NOT DETECTED NOT DETECTED Final    Comment: (NOTE) The GeneXpert MRSA Assay (FDA approved for NASAL specimens only), is one component of a comprehensive MRSA colonization surveillance program. It is not intended to diagnose MRSA infection nor to guide or monitor treatment for MRSA infections. Test performance is not FDA approved in patients less than 1 years old. Performed at [facility], [address], [place], [place] [address]          Radiology Studies: MR BRAIN WO CONTRAST  Result Date: 11/19/2020 CLINICAL DATA:  Mental status change, unknown cause EXAM: MRI HEAD WITHOUT CONTRAST TECHNIQUE: Multiplanar, multiecho pulse sequences of the brain and surrounding structures were obtained without intravenous contrast. COMPARISON:  CT head 11/17/2020. FINDINGS: Brain: No acute infarction, hemorrhage, hydrocephalus, extra-axial collection or mass lesion. Age advanced atrophy with ex vacuo ventricular dilation. Mild scattered T2 hyperintensities in the white matter, nonspecific but consistent with chronic microvascular ischemic disease. Vascular: Visualized major arterial flow voids are maintained. Skull and upper cervical spine: Normal marrow signal. Sinuses/Orbits: Clear sinuses.  Unremarkable orbits. Other: Distended right C1-C2 joint capsule without obvious edema in this region. Trace left greater than right mastoid effusions. IMPRESSION: 1. No evidence of acute intracranial abnormality. 2. Age advanced generalized atrophy. Electronically Signed   By: Feliberto Harts M.D.   On: 11/19/2020 06:22   DG Pelvis Portable  Result Date: 11/18/2020 CLINICAL DATA:  MRI screening EXAM: PORTABLE PELVIS 1-2 VIEWS COMPARISON:  None. FINDINGS: Catheter over the bladder. 14 mm oval  calcification in the right pelvis, possible bladder stone. Amorphous calcifications over pubic symphysis consistent with prostate calcification. No metallic density foreign body over the pelvis IMPRESSION: 1.  Negative for metallic density foreign body over the pelvis 2. Possible 14 mm bladder stone Electronically Signed   By: Jasmine Pang M.D.   On: 11/18/2020 21:26   DG Chest Port 1 View  Result Date: 11/19/2020 CLINICAL DATA:  Shortness of EXAM: PORTABLE CHEST 1 VIEW COMPARISON:  11/17/2020 FINDINGS: The heart size and mediastinal contours are within normal limits. There is new, diffuse bilateral interstitial pulmonary opacity and pulmonary vascular prominence. Surgical suture projects in the left midlung. The visualized skeletal structures are unremarkable. IMPRESSION: New, diffuse bilateral interstitial pulmonary opacity and pulmonary vascular prominence, most consistent with edema. No focal airspace opacity. Electronically Signed   By: Lauralyn Primes M.D.   On: 11/19/2020 16:51   DG Chest Port 1 View  Result Date: 11/17/2020 CLINICAL DATA:  Cough EXAM: PORTABLE CHEST 1 VIEW COMPARISON:  11/14/2020 FINDINGS: Scarring in the right lung apex. Postsurgical changes in the left upper lobe with volume loss in left hemithorax. No focal consolidation. No pleural effusion or pneumothorax. The heart is normal in size.  Thoracic aortic atherosclerosis. IMPRESSION: No evidence of acute cardiopulmonary disease. Electronically Signed   By: Charline Bills M.D.   On: 11/17/2020 21:17   DG Abd Portable 1V  Result Date: 11/18/2020 CLINICAL DATA:  MRI screening EXAM: PORTABLE ABDOMEN - 1 VIEW COMPARISON:  None. FINDINGS: Lung bases are clear. Nonobstructed gas pattern. No metallic density foreign bodies over the abdomen. IMPRESSION: Negative.  No metallic density foreign bodies over the abdomen Electronically Signed   By: Jasmine Pang M.D.   On: 11/18/2020 21:25   EEG adult  Result Date: 11/19/2020 Jefferson Fuel, MD     11/19/2020  3:03 PM Routine EEG Report ABDELRAHMAN NAIR is a 65 y.o. male with a history of sepsis who is undergoing an EEG to evaluate for seizures. Report: This EEG was acquired with electrodes placed according to the International 10-20 electrode system (including Fp1, Fp2, F3, F4, C3, C4, P3, P4, O1, O2, T3, T4, T5, T6, A1, A2, Fz, Cz, Pz). The following electrodes were missing or displaced: none. The best background was 4-6 Hz. This activity is reactive to stimulation. There was rare sleep architecture noted which manifested as sleep spindles. There was no focal slowing. There were no interictal epileptiform discharges. There were no electrographic seizures identified. Photic stimulation and hyperventilation were not performed. Impression and clinical correlation: This EEG was obtained while sedated (post ativan administration) with rare sleep architecture and was abnormal due to moderate diffuse slowing indicative of global cerebral dysfunction. Bing Neighbors, MD [facility] [phone] If 7pm- 7am, please page neurology on call as listed in AMION.        Scheduled Meds:  budesonide (PULMICORT) nebulizer solution  0.25 mg Nebulization BID   Chlorhexidine Gluconate Cloth  6 each Topical Daily   enoxaparin (LOVENOX) injection  40 mg Subcutaneous Q24H   feeding supplement  237 mL Oral BID BM   folic acid  1 mg Oral Daily   ipratropium-albuterol  3 mL Nebulization TID   LORazepam  0-4 mg Intravenous Q4H   Followed by   LORazepam  0-4 mg Intravenous Q8H   midodrine  10 mg Oral TID WC   multivitamin with minerals  1 tablet Oral Daily   pantoprazole (PROTONIX) IV  40 mg Intravenous Q24H   Continuous Infusions:  sodium chloride 250 mL (11/17/20 1235)   ampicillin-sulbactam (UNASYN) IV Stopped (11/19/20 1458)   azithromycin 500 mg (11/19/20 1715)   dextrose 5 % with  KCl 20 mEq / L 100 mL/hr at 11/19/20 1800   sodium chloride Stopped (11/19/20 1107)   thiamine  injection     vancomycin       LOS: 5 days    Time spent: 35 mins    Erick Blinks, MD [facility]   If 7PM-7AM, please contact night-coverage [email or web address]  11/19/2020, 6:24 PM

## 2020-11-19 NOTE — TOC Initial Note (Signed)
Transition of Care [facility]) - Initial/Assessment Note    Patient Details  Name: Dustin Vazquez MRN: [id number] Date of Birth: December 05, 1955  Transition of Care [facility]) CM/SW Contact:    Marina Goodell Phone Number: [phone] 11/19/2020, 4:33 PM  Clinical Narrative:                  Patient [facility] at [facility] via EMS found on the side of the street.  Patient's main contact is his son Erubiel Manasco [phone].  CSW spoke w/ Mr. Lonzo about TOC role in patient care and discharge planning process.  Mr. Kiester state the patient is abel to perform all ADLs independently but does not drive and calls for assistance when he needs to go to the grocery store, or to take the train for [facility] appointments.  Mr. Wendorff stated he wanted an update on the patient's medical status before continuing with a discharge discussion.  CSW sent secure chat to Palliative Care NP and Attending updating on Mr. Sindelar's request.  Expected Discharge Plan: Skilled Nursing Facility Barriers to Discharge: Barriers Unresolved (comment) (No family members or supports)   Patient Goals and CMS Choice        Expected Discharge Plan and Services Expected Discharge Plan: Skilled Nursing Facility In-house Referral: Clinical Social Work     Living arrangements for the past 2 months: No permanent address                                      Prior Living Arrangements/Services Living arrangements for the past 2 months: No permanent address Lives with:: Self Patient language and need for interpreter reviewed:: Yes              Criminal Activity/Legal Involvement Pertinent to Current Situation/Hospitalization: No - Comment as needed  Activities of Daily Living Home Assistive Devices/Equipment: None ADL Screening (condition at time of admission) Patient's cognitive ability adequate to safely complete daily activities?: No Is the patient deaf or have difficulty hearing?: No Does the  patient have difficulty seeing, even when wearing glasses/contacts?: No Does the patient have difficulty concentrating, remembering, or making decisions?: Yes Patient able to express need for assistance with ADLs?: No Does the patient have difficulty dressing or bathing?: Yes Independently performs ADLs?: No Communication: Independent Dressing (OT): Dependent Grooming: Dependent Feeding: Dependent Bathing: Dependent Toileting: Dependent In/Out Bed: Dependent Walks in Home: Independent Does the patient have difficulty walking or climbing stairs?: No Weakness of Legs: None Weakness of Arms/Hands: None  Permission Sought/Granted                  Emotional Assessment Appearance:: Appears older than stated age Attitude/Demeanor/Rapport: Unable to Assess Affect (typically observed): Unable to Assess     Psych Involvement: No (comment)  Admission diagnosis:  Severe sepsis (HCC) [A41.9, R65.20] Severe sepsis with septic shock (CODE) (HCC) [R65.21] Sepsis with acute renal failure and septic shock, due to unspecified organism, unspecified acute renal failure type (HCC) [A41.9, R65.21, N17.9] Patient Active Problem List   Diagnosis Date Noted   Protein-calorie malnutrition, severe 11/16/2020   Severe sepsis (HCC) 11/14/2020   Severe sepsis with septic shock (CODE) (HCC) 11/14/2020   PCP:  [facility] Pharmacy:  No Pharmacies Listed    Social Determinants of Health (SDOH) Interventions    Readmission Risk Interventions No flowsheet data found.

## 2020-11-19 NOTE — Progress Notes (Signed)
EEG complete - results pending 

## 2020-11-20 DIAGNOSIS — E43 Unspecified severe protein-calorie malnutrition: Secondary | ICD-10-CM | POA: Diagnosis not present

## 2020-11-20 DIAGNOSIS — G9341 Metabolic encephalopathy: Secondary | ICD-10-CM | POA: Diagnosis present

## 2020-11-20 DIAGNOSIS — N139 Obstructive and reflux uropathy, unspecified: Secondary | ICD-10-CM | POA: Diagnosis not present

## 2020-11-20 DIAGNOSIS — R6521 Severe sepsis with septic shock: Secondary | ICD-10-CM | POA: Diagnosis not present

## 2020-11-20 DIAGNOSIS — R7881 Bacteremia: Secondary | ICD-10-CM | POA: Diagnosis present

## 2020-11-20 DIAGNOSIS — N390 Urinary tract infection, site not specified: Secondary | ICD-10-CM | POA: Diagnosis present

## 2020-11-20 DIAGNOSIS — R319 Hematuria, unspecified: Secondary | ICD-10-CM | POA: Diagnosis not present

## 2020-11-20 DIAGNOSIS — K7030 Alcoholic cirrhosis of liver without ascites: Secondary | ICD-10-CM | POA: Diagnosis present

## 2020-11-20 DIAGNOSIS — B965 Pseudomonas (aeruginosa) (mallei) (pseudomallei) as the cause of diseases classified elsewhere: Secondary | ICD-10-CM | POA: Diagnosis present

## 2020-11-20 DIAGNOSIS — D696 Thrombocytopenia, unspecified: Secondary | ICD-10-CM | POA: Diagnosis present

## 2020-11-20 DIAGNOSIS — R338 Other retention of urine: Secondary | ICD-10-CM | POA: Diagnosis present

## 2020-11-20 LAB — RENAL FUNCTION PANEL
Albumin: 2.3 g/dL — ABNORMAL LOW (ref 3.5–5.0)
Anion gap: 6 (ref 5–15)
BUN: 13 mg/dL (ref 8–23)
CO2: 23 mmol/L (ref 22–32)
Calcium: 7.7 mg/dL — ABNORMAL LOW (ref 8.9–10.3)
Chloride: 113 mmol/L — ABNORMAL HIGH (ref 98–111)
Creatinine, Ser: 0.77 mg/dL (ref 0.61–1.24)
GFR, Estimated: >60 mL/min (ref >60)
Glucose, Bld: 108 mg/dL — ABNORMAL HIGH (ref 70–99)
Phosphorus: 3.2 mg/dL (ref 2.5–4.6)
Potassium: 3.7 mmol/L (ref 3.5–5.1)
Sodium: 142 mmol/L (ref 135–145)

## 2020-11-20 LAB — CBC
HCT: 27.3 % — ABNORMAL LOW (ref 39.0–52.0)
Hemoglobin: 9 g/dL — ABNORMAL LOW (ref 13.0–17.0)
MCH: 33.7 pg (ref 26.0–34.0)
MCHC: 33 g/dL (ref 30.0–36.0)
MCV: 102.2 fL — ABNORMAL HIGH (ref 80.0–100.0)
Platelets: 93 10*3/uL — ABNORMAL LOW (ref 150–400)
RBC: 2.67 MIL/uL — ABNORMAL LOW (ref 4.22–5.81)
RDW: 15.9 % — ABNORMAL HIGH (ref 11.5–15.5)
WBC: 5.4 10*3/uL (ref 4.0–10.5)
nRBC: 0 % (ref 0.0–0.2)

## 2020-11-20 LAB — BLOOD CULTURE ID PANEL (REFLEXED) - BCID2

## 2020-11-20 LAB — AMMONIA: Ammonia: 27 umol/L (ref 9–35)

## 2020-11-20 LAB — CREATININE, SERUM
Creatinine, Ser: 0.72 mg/dL (ref 0.61–1.24)
GFR, Estimated: >60 mL/min (ref >60)

## 2020-11-20 LAB — CULTURE, BLOOD (SINGLE)
Culture: NO GROWTH
Special Requests: ADEQUATE

## 2020-11-20 LAB — BRAIN NATRIURETIC PEPTIDE: B Natriuretic Peptide: 539.2 pg/mL — ABNORMAL HIGH (ref 0.0–100.0)

## 2020-11-20 LAB — GLUCOSE, CAPILLARY
Glucose-Capillary: 97 mg/dL (ref 70–99)
Glucose-Capillary: 101 mg/dL — ABNORMAL HIGH (ref 70–99)
Glucose-Capillary: 93 mg/dL (ref 70–99)
Glucose-Capillary: 120 mg/dL — ABNORMAL HIGH (ref 70–99)
Glucose-Capillary: 96 mg/dL (ref 70–99)

## 2020-11-20 LAB — PROCALCITONIN: Procalcitonin: 1.3 ng/mL

## 2020-11-20 MED ORDER — SODIUM CHLORIDE 0.9 % IV SOLN
2.0000 g | Freq: Three times a day (TID) | INTRAVENOUS | Status: DC
  Administered 2020-11-20 – 2020-11-22 (×8): 2 g via INTRAVENOUS
  Filled 2020-11-20 (×11): qty 2

## 2020-11-20 NOTE — Progress Notes (Signed)
Pt remains drowsy, moans, does not follow commands, opens eyes to painful stimuli. SR on monitor, remains on RA no significant events during shift.

## 2020-11-20 NOTE — Progress Notes (Signed)
PHARMACY - PHYSICIAN COMMUNICATION CRITICAL VALUE ALERT - BLOOD CULTURE IDENTIFICATION (BCID)  Dustin Vazquez is an 65 y.o. male who presented to [facility] on 11/14/2020 with a chief complaint of sepsis.   Assessment:  Pseudomonas in 1 of 4 bottles, no resistance.  (include suspected source if known)  Name of physician (or Provider) Contacted:  Doylene Canard  Current antibiotics:  Vanc, azithromycin, and unasyn.   Changes to prescribed antibiotics recommended:  Recommendations accepted by provider Will d/c current abx and start cefepime 2 gm IV Q8H .   Results for orders placed or performed during the hospital encounter of 11/14/20  Blood Culture ID Panel (Reflexed) (Collected: 11/19/2020 10:35 AM)  Result Value Ref Range   Enterococcus faecalis NOT DETECTED NOT DETECTED   Enterococcus Faecium NOT DETECTED NOT DETECTED   Listeria monocytogenes NOT DETECTED NOT DETECTED   Staphylococcus species NOT DETECTED NOT DETECTED   Staphylococcus aureus (BCID) NOT DETECTED NOT DETECTED   Staphylococcus epidermidis NOT DETECTED NOT DETECTED   Staphylococcus lugdunensis NOT DETECTED NOT DETECTED   Streptococcus species NOT DETECTED NOT DETECTED   Streptococcus agalactiae NOT DETECTED NOT DETECTED   Streptococcus pneumoniae NOT DETECTED NOT DETECTED   Streptococcus pyogenes NOT DETECTED NOT DETECTED   A.calcoaceticus-baumannii NOT DETECTED NOT DETECTED   Bacteroides fragilis NOT DETECTED NOT DETECTED   Enterobacterales NOT DETECTED NOT DETECTED   Enterobacter cloacae complex NOT DETECTED NOT DETECTED   Escherichia coli NOT DETECTED NOT DETECTED   Klebsiella aerogenes NOT DETECTED NOT DETECTED   Klebsiella oxytoca NOT DETECTED NOT DETECTED   Klebsiella pneumoniae NOT DETECTED NOT DETECTED   Proteus species NOT DETECTED NOT DETECTED   Salmonella species NOT DETECTED NOT DETECTED   Serratia marcescens NOT DETECTED NOT DETECTED   Haemophilus influenzae NOT DETECTED NOT DETECTED   Neisseria  meningitidis NOT DETECTED NOT DETECTED   Pseudomonas aeruginosa DETECTED (A) NOT DETECTED   Stenotrophomonas maltophilia NOT DETECTED NOT DETECTED   Candida albicans NOT DETECTED NOT DETECTED   Candida auris NOT DETECTED NOT DETECTED   Candida glabrata NOT DETECTED NOT DETECTED   Candida krusei NOT DETECTED NOT DETECTED   Candida parapsilosis NOT DETECTED NOT DETECTED   Candida tropicalis NOT DETECTED NOT DETECTED   Cryptococcus neoformans/gattii NOT DETECTED NOT DETECTED   CTX-M ESBL NOT DETECTED NOT DETECTED   Carbapenem resistance IMP NOT DETECTED NOT DETECTED   Carbapenem resistance KPC NOT DETECTED NOT DETECTED   Carbapenem resistance NDM NOT DETECTED NOT DETECTED   Carbapenem resistance VIM NOT DETECTED NOT DETECTED    Orpha Dain D 11/20/2020  5:44 AM

## 2020-11-20 NOTE — Progress Notes (Addendum)
PROGRESS NOTE    Dustin Vazquez  ZOX:[id number] DOB: 1956/02/03 DOA: 11/14/2020 PCP: [facility]    Brief Narrative:  65 year old male is brought to the hospital after he was found on the side of the road with altered mental status.  Noted to be hypotensive, hypothermic on arrival with elevated creatinine, liver enzymes and lactic acid.  Noted to have urinary tract infection and was admitted to the hospital for septic shock requiring vasopressors.  He was admitted to the ICU for further management.  Transferred to [facility] on 9/14   Assessment & Plan:   Active Problems:   Severe sepsis (HCC)   Severe sepsis with septic shock (CODE) (HCC)   Protein-calorie malnutrition, severe   Bacteremia due to Pseudomonas   Alcoholic cirrhosis of liver without ascites (HCC)   Acute metabolic encephalopathy   Thrombocytopenia (HCC)   Acute urinary retention   Hematuria   Acute lower UTI   Sepsis with septic shock secondary to urinary tract infection/Pseudomonas bacteremia -He has been weaned off of vasopressors -Initially on Unasyn, subsequently transition to cefepime for Pseudomonas coverage -Urine culture positive for staph epidermidis -1 out of 4 blood cultures positive for Pseudomonas  Acute metabolic encephalopathy -Possibly related to sepsis, may also have a component of alcohol withdrawal -He does have a history of alcoholism and may have a component of Wernicke's, on high-dose thiamine -Ammonia level normal  -CT head negative x2 -MRI brain unremarkable -EEG without evidence of epileptiform discharges -He has electrolyte disturbances including hypernatremia which also may be playing a role  Hypernatremia -Related to decreased free water intake -Improved with hypotonic fluids  History of alcohol abuse -Continue on CIWA protocol  Alcoholic cirrhosis -Continue to follow LFTs -No evidence of ascites at this time  Possible aspiration -Noted to have coughing/rhonchi  after taking p.o. meds -Once mental status has improved, can consider swallow evaluation, will keep n.p.o. for now -Chest x-ray with bilateral infiltrates, suspect developing pneumonia -Currently, he is not requiring any oxygen -PCT 1.3 -Continue on IV antibiotics  Thrombocytopenia -Likely multifactorial, related to acute infectious process, recent alcohol use, cirrhosis -Continue to monitor  Acute kidney injury -Secondary to hypovolemia -Overall renal function has improved with IV hydration  Urinary retention -Noted to have significant hematuria -Urology consulted and coud catheter was placed -Recommended to consider removing catheter once patient is more awake and alert  COPD -No wheezing at this time -Continue bronchodilators and inhaled steroids  Goals of care -Patient was living independently in a camper -Discussed CODE STATUS with patient's son who confirmed DNR -He is overall malnourished and may have difficulty making a meaningful recovery -Palliative care consulted to further address goals of care   DVT prophylaxis: enoxaparin (LOVENOX) injection 40 mg Start: 11/19/20 2200  SCDs  Code Status: DNR, confirmed with patient's son Family Communication: Updated patient's son over the phone Disposition Plan: Status is: Inpatient  Remains inpatient appropriate because:Hemodynamically unstable, Altered mental status, IV treatments appropriate due to intensity of illness or inability to take PO, and Inpatient level of care appropriate due to severity of illness  Dispo: The patient is from: Home              Anticipated d/c is to:  TBD              Patient currently is not medically stable to d/c.   Difficult to place patient No         Consultants:  PCCM Urology Palliative care  Procedures:  Antimicrobials:  Unasyn 9/14> 9/17 Azithromycin 9/13> 9/17 Cefepime 9/17>   Subjective: Continues to be lethargic.  Now Ativan administered in over 24 hours.   He briefly opens his eyes to voice, but does not answer any questions or follow commands.  Objective: Vitals:   11/20/20 1300 11/20/20 1435 11/20/20 1500 11/20/20 1600  BP: (!) 112/58     Pulse: 86 94 96 95  Resp: 19 (!) 22 18 (!) 21  Temp:    98.9 F (37.2 C)  TempSrc:    Oral  SpO2: 100% 100% 98% 98%  Weight:      Height:        Intake/Output Summary (Last 24 hours) at 11/20/2020 1736 Last data filed at 11/20/2020 1600 Gross per 24 hour  Intake 2519.51 ml  Output 1301 ml  Net 1218.51 ml   Filed Weights   11/14/20 2052  Weight: 63.5 kg    Examination:  General exam: lethargic Respiratory system: bilateral rhonchi. Respiratory effort normal. Cardiovascular system:RRR. No murmurs, rubs, gallops. Gastrointestinal system: Abdomen is nondistended, soft and nontender. No organomegaly or masses felt. Normal bowel sounds heard. Central nervous system:No focal neurological deficits. Extremities: No C/C/E, +pedal pulses Skin: No rashes, lesions or ulcers Psychiatry: lethargic      Data Reviewed: I have personally reviewed following labs and imaging studies  CBC: Recent Labs  Lab 11/14/20 2008 11/15/20 0529 11/17/20 0641 11/17/20 1554 11/18/20 0254 11/19/20 0623 11/20/20 0434  WBC 13.4*   < > 5.7 6.7 6.4 18.2* 5.4  NEUTROABS 10.9*  --  3.8  --   --   --   --   HGB 14.4   < > 10.5* 11.4* 10.3* 10.2* 9.0*  HCT 40.0   < > 32.1* 34.1* 31.1* 31.0* 27.3*  MCV 92.6   < > 101.6* 99.1 100.0 104.7* 102.2*  PLT 216   < > 114* 125* 115* 121* 93*   < > = values in this interval not displayed.   Basic Metabolic Panel: Recent Labs  Lab 11/14/20 2044 11/15/20 0529 11/17/20 0641 11/18/20 0254 11/19/20 0623 11/20/20 0434  NA  --  140 145 147* 148* 142  K  --  3.9 3.9 3.5 3.4* 3.7  CL  --  110 113* 116* 119* 113*  CO2  --  20* 26 24 23 23   GLUCOSE  --  102* 127* 98 89 108*  BUN  --  33* 18 14 13 13   CREATININE  --  1.48* 0.66 0.57* 0.91 0.77  0.72  CALCIUM  --  7.9*  8.4* 8.3* 8.4* 7.7*  MG 2.1  --  2.0  --   --   --   PHOS  --   --  2.8 2.6 1.7* 3.2   GFR: Estimated Creatinine Clearance: 81.6 mL/min (by C-G formula based on SCr of 0.72 mg/dL). Liver Function Tests: Recent Labs  Lab 11/14/20 2008 11/18/20 0254 11/19/20 0623 11/20/20 0434  AST 89* 69*  --   --   ALT 34 35  --   --   ALKPHOS 74 59  --   --   BILITOT 2.5* 0.8  --   --   PROT 6.8 5.4*  --   --   ALBUMIN 2.8* 2.2* 2.6* 2.3*   Recent Labs  Lab 11/14/20 2008  LIPASE 141*   Recent Labs  Lab 11/16/20 1139 11/17/20 0838 11/20/20 1200  AMMONIA 63* 23 27   Coagulation Profile: Recent Labs  Lab 11/14/20 2008 11/15/20 0529  INR  1.5* 1.5*   Cardiac Enzymes: Recent Labs  Lab 11/14/20 2044  CKTOTAL 258   BNP (last 3 results) No results for input(s): PROBNP in the last 8760 hours. HbA1C: No results for input(s): HGBA1C in the last 72 hours. CBG: Recent Labs  Lab 11/19/20 2315 11/20/20 0415 11/20/20 0729 11/20/20 1105 11/20/20 1630  GLUCAP 92 97 101* 93 120*   Lipid Profile: No results for input(s): CHOL, HDL, LDLCALC, TRIG, CHOLHDL, LDLDIRECT in the last 72 hours. Thyroid Function Tests: No results for input(s): TSH, T4TOTAL, FREET4, T3FREE, THYROIDAB in the last 72 hours.  Anemia Panel: No results for input(s): VITAMINB12, FOLATE, FERRITIN, TIBC, IRON, RETICCTPCT in the last 72 hours.  Sepsis Labs: Recent Labs  Lab 11/14/20 2008 11/14/20 2148 11/15/20 0150 11/15/20 0529 11/20/20 0434  PROCALCITON  --   --   --  0.43 1.30  LATICACIDVEN 3.7* 2.5* 2.4*  --   --     Recent Results (from the past 240 hour(s))  Blood culture (routine single)     Status: None   Collection Time: 11/14/20  8:08 PM   Specimen: BLOOD  Result Value Ref Range Status   Specimen Description BLOOD RIGHT ANTECUBITAL  Final   Special Requests   Final    BOTTLES DRAWN AEROBIC AND ANAEROBIC Blood Culture adequate volume   Culture   Final    NO GROWTH 5 DAYS Performed at  [facility], [address] [place], [place] [address]    Report Status 11/19/2020 FINAL  Final  Urine Culture     Status: Abnormal   Collection Time: 11/14/20  8:08 PM   Specimen: Urine, Random  Result Value Ref Range Status   Specimen Description   Final    URINE, RANDOM Performed at [facility], [address], [place], [place] [address]    Special Requests   Final    NONE Performed at [facility], [address] [place], [place] [address]    Culture >=100,000 COLONIES/mL STAPHYLOCOCCUS EPIDERMIDIS (A)  Final   Report Status 11/17/2020 FINAL  Final   Organism ID, Bacteria STAPHYLOCOCCUS EPIDERMIDIS (A)  Final      Susceptibility   Staphylococcus epidermidis - MIC*    CIPROFLOXACIN 2 INTERMEDIATE Intermediate     GENTAMICIN <=0.5 SENSITIVE Sensitive     NITROFURANTOIN <=16 SENSITIVE Sensitive     OXACILLIN <=0.25 SENSITIVE Sensitive     TETRACYCLINE <=1 SENSITIVE Sensitive     VANCOMYCIN 2 SENSITIVE Sensitive     TRIMETH/SULFA 80 RESISTANT Resistant     CLINDAMYCIN <=0.25 SENSITIVE Sensitive     RIFAMPIN <=0.5 SENSITIVE Sensitive     Inducible Clindamycin NEGATIVE Sensitive     * >=100,000 COLONIES/mL STAPHYLOCOCCUS EPIDERMIDIS  Resp Panel by RT-PCR (Flu A&B, Covid) Nasopharyngeal Swab     Status: None   Collection Time: 11/14/20  9:45 PM   Specimen: Nasopharyngeal Swab; Nasopharyngeal(NP) swabs in vial transport medium  Result Value Ref Range Status   SARS Coronavirus 2 by RT PCR NEGATIVE NEGATIVE Final    Comment: (NOTE) SARS-CoV-2 target nucleic acids are NOT DETECTED.  The SARS-CoV-2 RNA is generally detectable in upper respiratory specimens during the acute phase of infection. The lowest concentration of SARS-CoV-2 viral copies this assay can detect is 138 copies/mL. A negative result does not preclude SARS-Cov-2 infection and should not be used as the sole basis for treatment or other patient management decisions. A negative  result may occur with  improper specimen collection/handling, submission of specimen other  than nasopharyngeal swab, presence of viral mutation(s) within the areas targeted by this assay, and inadequate number of viral copies(<138 copies/mL). A negative result must be combined with clinical observations, patient history, and epidemiological information. The expected result is Negative.  Fact Sheet for Patients:  BloggerCourse.com  Fact Sheet for Healthcare Providers:  SeriousBroker.it  This test is no t yet approved or cleared by the [place] FDA and  has been authorized for detection and/or diagnosis of SARS-CoV-2 by FDA under an Emergency Use Authorization (EUA). This EUA will remain  in effect (meaning this test can be used) for the duration of the COVID-19 declaration under Section 564(b)(1) of the Act, 21 U.S.C.section 360bbb-3(b)(1), unless the authorization is terminated  or revoked sooner.       Influenza A by PCR NEGATIVE NEGATIVE Final   Influenza B by PCR NEGATIVE NEGATIVE Final    Comment: (NOTE) The Xpert Xpress SARS-CoV-2/FLU/RSV plus assay is intended as an aid in the diagnosis of influenza from Nasopharyngeal swab specimens and should not be used as a sole basis for treatment. Nasal washings and aspirates are unacceptable for Xpert Xpress SARS-CoV-2/FLU/RSV testing.  Fact Sheet for Patients: BloggerCourse.com  Fact Sheet for Healthcare Providers: SeriousBroker.it  This test is not yet approved or cleared by the [place] FDA and has been authorized for detection and/or diagnosis of SARS-CoV-2 by FDA under an Emergency Use Authorization (EUA). This EUA will remain in effect (meaning this test can be used) for the duration of the COVID-19 declaration under Section 564(b)(1) of the Act, 21 U.S.C. section 360bbb-3(b)(1), unless the authorization is  terminated or revoked.  Performed at [facility], [address] [place], [place] [address]   Culture, blood (single) w Reflex to ID Panel     Status: None   Collection Time: 11/15/20  1:50 AM   Specimen: BLOOD  Result Value Ref Range Status   Specimen Description BLOOD BLOOD RIGHT HAND  Final   Special Requests   Final    BOTTLES DRAWN AEROBIC AND ANAEROBIC Blood Culture adequate volume   Culture   Final    NO GROWTH 5 DAYS Performed at [facility], [address], [place], [place] [address]    Report Status 11/20/2020 FINAL  Final  MRSA Next Gen by PCR, Nasal     Status: None   Collection Time: 11/15/20  1:54 AM   Specimen: Nasal Mucosa; Nasal Swab  Result Value Ref Range Status   MRSA by PCR Next Gen NOT DETECTED NOT DETECTED Final    Comment: (NOTE) The GeneXpert MRSA Assay (FDA approved for NASAL specimens only), is one component of a comprehensive MRSA colonization surveillance program. It is not intended to diagnose MRSA infection nor to guide or monitor treatment for MRSA infections. Test performance is not FDA approved in patients less than 63 years old. Performed at [facility], [address] [place], [place] [address]   CULTURE, BLOOD (ROUTINE X 2) w Reflex to ID Panel     Status: None (Preliminary result)   Collection Time: 11/19/20 10:35 AM   Specimen: BLOOD  Result Value Ref Range Status   Specimen Description BLOOD BLOOD LEFT HAND  Final   Special Requests   Final    BOTTLES DRAWN AEROBIC AND ANAEROBIC Blood Culture adequate volume   Culture  Setup Time   Final    Organism ID to follow GRAM NEGATIVE RODS AEROBIC BOTTLE ONLY CRITICAL RESULT CALLED TO, READ BACK BY AND VERIFIED WITH: JASON  ROBBINS AT 0500 11/20/20.PMF Performed at [facility], [address] [place], [place] [address]    Culture GRAM NEGATIVE RODS  Final   Report Status PENDING  Incomplete  Blood Culture ID Panel (Reflexed)     Status:  Abnormal   Collection Time: 11/19/20 10:35 AM  Result Value Ref Range Status   Enterococcus faecalis NOT DETECTED NOT DETECTED Final   Enterococcus Faecium NOT DETECTED NOT DETECTED Final   Listeria monocytogenes NOT DETECTED NOT DETECTED Final   Staphylococcus species NOT DETECTED NOT DETECTED Final   Staphylococcus aureus (BCID) NOT DETECTED NOT DETECTED Final   Staphylococcus epidermidis NOT DETECTED NOT DETECTED Final   Staphylococcus lugdunensis NOT DETECTED NOT DETECTED Final   Streptococcus species NOT DETECTED NOT DETECTED Final   Streptococcus agalactiae NOT DETECTED NOT DETECTED Final   Streptococcus pneumoniae NOT DETECTED NOT DETECTED Final   Streptococcus pyogenes NOT DETECTED NOT DETECTED Final   A.calcoaceticus-baumannii NOT DETECTED NOT DETECTED Final   Bacteroides fragilis NOT DETECTED NOT DETECTED Final   Enterobacterales NOT DETECTED NOT DETECTED Final   Enterobacter cloacae complex NOT DETECTED NOT DETECTED Final   Escherichia coli NOT DETECTED NOT DETECTED Final   Klebsiella aerogenes NOT DETECTED NOT DETECTED Final   Klebsiella oxytoca NOT DETECTED NOT DETECTED Final   Klebsiella pneumoniae NOT DETECTED NOT DETECTED Final   Proteus species NOT DETECTED NOT DETECTED Final   Salmonella species NOT DETECTED NOT DETECTED Final   Serratia marcescens NOT DETECTED NOT DETECTED Final   Haemophilus influenzae NOT DETECTED NOT DETECTED Final   Neisseria meningitidis NOT DETECTED NOT DETECTED Final   Pseudomonas aeruginosa DETECTED (A) NOT DETECTED Final    Comment: CRITICAL RESULT CALLED TO, READ BACK BY AND VERIFIED WITH: JASON ROBBINS AT 0500 11/20/20.PMF    Stenotrophomonas maltophilia NOT DETECTED NOT DETECTED Final   Candida albicans NOT DETECTED NOT DETECTED Final   Candida auris NOT DETECTED NOT DETECTED Final   Candida glabrata NOT DETECTED NOT DETECTED Final   Candida krusei NOT DETECTED NOT DETECTED Final   Candida parapsilosis NOT DETECTED NOT DETECTED Final    Candida tropicalis NOT DETECTED NOT DETECTED Final   Cryptococcus neoformans/gattii NOT DETECTED NOT DETECTED Final   CTX-M ESBL NOT DETECTED NOT DETECTED Final   Carbapenem resistance IMP NOT DETECTED NOT DETECTED Final   Carbapenem resistance KPC NOT DETECTED NOT DETECTED Final   Carbapenem resistance NDM NOT DETECTED NOT DETECTED Final   Carbapenem resistance VIM NOT DETECTED NOT DETECTED Final    Comment: Performed at [facility], [address] [place], [place] [address]  CULTURE, BLOOD (ROUTINE X 2) w Reflex to ID Panel     Status: None (Preliminary result)   Collection Time: 11/19/20 10:40 AM   Specimen: BLOOD  Result Value Ref Range Status   Specimen Description BLOOD BLOOD RIGHT WRIST  Final   Special Requests   Final    BOTTLES DRAWN AEROBIC AND ANAEROBIC Blood Culture adequate volume   Culture   Final    NO GROWTH < 24 HOURS Performed at [facility], [address] [place], [place] [address]    Report Status PENDING  Incomplete         Radiology Studies: MR BRAIN WO CONTRAST  Result Date: 11/19/2020 CLINICAL DATA:  Mental status change, unknown cause EXAM: MRI HEAD WITHOUT CONTRAST TECHNIQUE: Multiplanar, multiecho pulse sequences of the brain and surrounding structures were obtained without intravenous contrast. COMPARISON:  CT head 11/17/2020. FINDINGS: Brain: No acute infarction, hemorrhage, hydrocephalus, extra-axial  collection or mass lesion. Age advanced atrophy with ex vacuo ventricular dilation. Mild scattered T2 hyperintensities in the white matter, nonspecific but consistent with chronic microvascular ischemic disease. Vascular: Visualized major arterial flow voids are maintained. Skull and upper cervical spine: Normal marrow signal. Sinuses/Orbits: Clear sinuses.  Unremarkable orbits. Other: Distended right C1-C2 joint capsule without obvious edema in this region. Trace left greater than right mastoid effusions. IMPRESSION: 1. No evidence  of acute intracranial abnormality. 2. Age advanced generalized atrophy. Electronically Signed   By: Feliberto Harts M.D.   On: 11/19/2020 06:22   DG Pelvis Portable  Result Date: 11/18/2020 CLINICAL DATA:  MRI screening EXAM: PORTABLE PELVIS 1-2 VIEWS COMPARISON:  None. FINDINGS: Catheter over the bladder. 14 mm oval calcification in the right pelvis, possible bladder stone. Amorphous calcifications over pubic symphysis consistent with prostate calcification. No metallic density foreign body over the pelvis IMPRESSION: 1. Negative for metallic density foreign body over the pelvis 2. Possible 14 mm bladder stone Electronically Signed   By: Jasmine Pang M.D.   On: 11/18/2020 21:26   DG Chest Port 1 View  Result Date: 11/19/2020 CLINICAL DATA:  Shortness of EXAM: PORTABLE CHEST 1 VIEW COMPARISON:  11/17/2020 FINDINGS: The heart size and mediastinal contours are within normal limits. There is new, diffuse bilateral interstitial pulmonary opacity and pulmonary vascular prominence. Surgical suture projects in the left midlung. The visualized skeletal structures are unremarkable. IMPRESSION: New, diffuse bilateral interstitial pulmonary opacity and pulmonary vascular prominence, most consistent with edema. No focal airspace opacity. Electronically Signed   By: Lauralyn Primes M.D.   On: 11/19/2020 16:51   DG Abd Portable 1V  Result Date: 11/18/2020 CLINICAL DATA:  MRI screening EXAM: PORTABLE ABDOMEN - 1 VIEW COMPARISON:  None. FINDINGS: Lung bases are clear. Nonobstructed gas pattern. No metallic density foreign bodies over the abdomen. IMPRESSION: Negative.  No metallic density foreign bodies over the abdomen Electronically Signed   By: Jasmine Pang M.D.   On: 11/18/2020 21:25   EEG adult  Result Date: 11/19/2020 Jefferson Fuel, MD     11/19/2020  3:03 PM Routine EEG Report DAEKWON BESWICK is a 65 y.o. male with a history of sepsis who is undergoing an EEG to evaluate for seizures. Report: This EEG  was acquired with electrodes placed according to the International 10-20 electrode system (including Fp1, Fp2, F3, F4, C3, C4, P3, P4, O1, O2, T3, T4, T5, T6, A1, A2, Fz, Cz, Pz). The following electrodes were missing or displaced: none. The best background was 4-6 Hz. This activity is reactive to stimulation. There was rare sleep architecture noted which manifested as sleep spindles. There was no focal slowing. There were no interictal epileptiform discharges. There were no electrographic seizures identified. Photic stimulation and hyperventilation were not performed. Impression and clinical correlation: This EEG was obtained while sedated (post ativan administration) with rare sleep architecture and was abnormal due to moderate diffuse slowing indicative of global cerebral dysfunction. Bing Neighbors, MD [facility] [phone] If 7pm- 7am, please page neurology on call as listed in AMION.        Scheduled Meds:  budesonide (PULMICORT) nebulizer solution  0.25 mg Nebulization BID   Chlorhexidine Gluconate Cloth  6 each Topical Daily   enoxaparin (LOVENOX) injection  40 mg Subcutaneous Q24H   feeding supplement  237 mL Oral BID BM   folic acid  1 mg Oral Daily   ipratropium-albuterol  3 mL Nebulization TID   LORazepam  0-4 mg Intravenous Q8H  pantoprazole (PROTONIX) IV  40 mg Intravenous Q24H   Continuous Infusions:  sodium chloride 250 mL (11/17/20 1235)   ceFEPime (MAXIPIME) IV 2 g (11/20/20 1400)   dextrose 5 % with KCl 20 mEq / L 20 mEq (11/20/20 0727)   sodium chloride Stopped (11/19/20 1107)   thiamine injection Stopped (11/19/20 2252)     LOS: 6 days    Time spent: 35 mins    Erick Blinks, MD [facility]   If 7PM-7AM, please contact night-coverage [email or web address]  11/20/2020, 5:36 PM

## 2020-11-20 NOTE — Progress Notes (Signed)
Pharmacy Antibiotic Note  Dustin Vazquez is a 65 y.o. male admitted on 11/14/2020 with sepsis.  Pseudomonas growing in 1 of 4 bottles (aerobic) . Pharmacy has been consulted for Cefepime dosing.  Plan: Cefepime 2 gm IV Q8H ordered to start on 9/17 @ 0600.   Height: 5\' 6"  (167.6 cm) Weight: 63.5 kg (140 lb) IBW/kg (Calculated) : 63.8  Temp (24hrs), Avg:98.5 F (36.9 C), Min:98 F (36.7 C), Max:99.1 F (37.3 C)  Recent Labs  Lab 11/14/20 2008 11/14/20 2148 11/15/20 0150 11/15/20 0529 11/17/20 0641 11/17/20 1554 11/18/20 0254 11/19/20 0623 11/20/20 0434  WBC 13.4*  --   --  13.2* 5.7 6.7 6.4 18.2* 5.4  CREATININE 2.07*  --   --  1.48* 0.66  --  0.57* 0.91 0.72  LATICACIDVEN 3.7* 2.5* 2.4*  --   --   --   --   --   --     Estimated Creatinine Clearance: 81.6 mL/min (by C-G formula based on SCr of 0.72 mg/dL).    Allergies  Allergen Reactions   Trazodone Anxiety    Antimicrobials this admission:   >>    >>   Dose adjustments this admission:   Microbiology results:  BCx:   UCx:    Sputum:    MRSA PCR:   Thank you for allowing pharmacy to be a part of this patient's care.  [name] D 11/20/2020 5:42 AM

## 2020-11-21 DIAGNOSIS — N139 Obstructive and reflux uropathy, unspecified: Secondary | ICD-10-CM | POA: Diagnosis not present

## 2020-11-21 DIAGNOSIS — R6521 Severe sepsis with septic shock: Secondary | ICD-10-CM | POA: Diagnosis not present

## 2020-11-21 DIAGNOSIS — E43 Unspecified severe protein-calorie malnutrition: Secondary | ICD-10-CM | POA: Diagnosis not present

## 2020-11-21 LAB — COMPREHENSIVE METABOLIC PANEL
ALT: 36 U/L (ref 0–44)
AST: 67 U/L — ABNORMAL HIGH (ref 15–41)
Albumin: 2.3 g/dL — ABNORMAL LOW (ref 3.5–5.0)
Alkaline Phosphatase: 69 U/L (ref 38–126)
Anion gap: 4 — ABNORMAL LOW (ref 5–15)
BUN: 11 mg/dL (ref 8–23)
CO2: 22 mmol/L (ref 22–32)
Calcium: 7.7 mg/dL — ABNORMAL LOW (ref 8.9–10.3)
Chloride: 109 mmol/L (ref 98–111)
Creatinine, Ser: 0.57 mg/dL — ABNORMAL LOW (ref 0.61–1.24)
GFR, Estimated: >60 mL/min (ref >60)
Glucose, Bld: 104 mg/dL — ABNORMAL HIGH (ref 70–99)
Potassium: 3.9 mmol/L (ref 3.5–5.1)
Sodium: 135 mmol/L (ref 135–145)
Total Bilirubin: 1.4 mg/dL — ABNORMAL HIGH (ref 0.3–1.2)
Total Protein: 5.4 g/dL — ABNORMAL LOW (ref 6.5–8.1)

## 2020-11-21 LAB — BLOOD GAS, ARTERIAL
Acid-base deficit: 0.4 mmol/L (ref 0.0–2.0)
Bicarbonate: 22.8 mmol/L (ref 20.0–28.0)
O2 Saturation: 90.9 %
Patient temperature: 37
pCO2 arterial: 32 mmHg (ref 32.0–48.0)
pH, Arterial: 7.46 — ABNORMAL HIGH (ref 7.350–7.450)
pO2, Arterial: 57 mmHg — ABNORMAL LOW (ref 83.0–108.0)

## 2020-11-21 LAB — CBC
HCT: 32.7 % — ABNORMAL LOW (ref 39.0–52.0)
Hemoglobin: 11.1 g/dL — ABNORMAL LOW (ref 13.0–17.0)
MCH: 33.3 pg (ref 26.0–34.0)
MCHC: 33.9 g/dL (ref 30.0–36.0)
MCV: 98.2 fL (ref 80.0–100.0)
Platelets: 76 10*3/uL — ABNORMAL LOW (ref 150–400)
RBC: 3.33 MIL/uL — ABNORMAL LOW (ref 4.22–5.81)
RDW: 15.5 % (ref 11.5–15.5)
WBC: 7 10*3/uL (ref 4.0–10.5)
nRBC: 0 % (ref 0.0–0.2)

## 2020-11-21 LAB — GLUCOSE, CAPILLARY
Glucose-Capillary: 104 mg/dL — ABNORMAL HIGH (ref 70–99)
Glucose-Capillary: 116 mg/dL — ABNORMAL HIGH (ref 70–99)
Glucose-Capillary: 95 mg/dL (ref 70–99)
Glucose-Capillary: 95 mg/dL (ref 70–99)
Glucose-Capillary: 96 mg/dL (ref 70–99)
Glucose-Capillary: 96 mg/dL (ref 70–99)
Glucose-Capillary: 99 mg/dL (ref 70–99)

## 2020-11-21 NOTE — Progress Notes (Signed)
PROGRESS NOTE    Dustin Vazquez  YQM:[id number] DOB: 1955/09/03 DOA: 11/14/2020 PCP: [facility]    Brief Narrative:  66 year old male is brought to the hospital after he was found on the side of the road with altered mental status.  Noted to be hypotensive, hypothermic on arrival with elevated creatinine, liver enzymes and lactic acid.  Noted to have urinary tract infection and was admitted to the hospital for septic shock requiring vasopressors.  He was admitted to the ICU for further management.  Transferred to [facility] on 9/14   Assessment & Plan:   Active Problems:   Severe sepsis (HCC)   Severe sepsis with septic shock (CODE) (HCC)   Protein-calorie malnutrition, severe   Bacteremia due to Pseudomonas   Alcoholic cirrhosis of liver without ascites (HCC)   Acute metabolic encephalopathy   Thrombocytopenia (HCC)   Acute urinary retention   Hematuria   Acute lower UTI   Sepsis with septic shock secondary to urinary tract infection/Pseudomonas bacteremia -He has been weaned off of vasopressors -Initially on Unasyn, subsequently transition to cefepime for Pseudomonas coverage -Urine culture positive for staph epidermidis -1 out of 4 blood cultures positive for Pseudomonas  Acute metabolic encephalopathy -Etiology is not entirely clear -May have a component related to underlying infection -He does have a history of alcoholism and may have a component of Wernicke's, he has been receiving high-dose thiamine -Ammonia level normal  -CT head negative x2 -MRI brain unremarkable -EEG without evidence of epileptiform discharges -Overall electrolyte abnormalities have corrected -Continue current treatments and if he does not have further improvement in the next 24 hours, may need to request neurology input  Hypernatremia -Related to decreased free water intake -Improved with hypotonic fluids  History of alcohol abuse -Continue on CIWA protocol  Alcoholic  cirrhosis -Continue to follow LFTs -No evidence of ascites at this time  Possible aspiration -Noted to have coughing/rhonchi after taking p.o. meds -Once mental status has improved, can consider swallow evaluation, will keep n.p.o. for now -Chest x-ray with bilateral infiltrates, suspect developing pneumonia -Currently, he is not requiring any oxygen -PCT 1.3 -Continue on IV antibiotics  Thrombocytopenia -Likely multifactorial, related to acute infectious process, recent alcohol use, cirrhosis -Continue to monitor  Acute kidney injury -Secondary to hypovolemia -Overall renal function has improved with IV hydration  Urinary retention -Noted to have significant hematuria -Urology consulted and coud catheter was placed -Recommended to consider removing catheter once patient is more awake and alert  COPD -No wheezing at this time -Continue bronchodilators and inhaled steroids  Goals of care -Patient was living independently in a camper -Discussed CODE STATUS with patient's son who confirmed DNR -He is overall malnourished and may have difficulty making a meaningful recovery -Palliative care consulted to further address goals of care   DVT prophylaxis:   SCDs  Code Status: DNR, confirmed with patient's son Family Communication: Updated patient's son over the phone Disposition Plan: Status is: Inpatient  Remains inpatient appropriate because:Hemodynamically unstable, Altered mental status, IV treatments appropriate due to intensity of illness or inability to take PO, and Inpatient level of care appropriate due to severity of illness  Dispo: The patient is from: Home              Anticipated d/c is to:  TBD              Patient currently is not medically stable to d/c.   Difficult to place patient No  Consultants:  PCCM Urology Palliative care  Procedures:    Antimicrobials:  Unasyn 9/14> 9/17 Azithromycin 9/13> 9/17 Cefepime  9/17>   Subjective: Continues to be lethargic.  He has not received any sedative or pain medications.  No fevers.  He is moving his arms and legs spontaneously.  Moaning and groaning at times. Objective: Vitals:   11/21/20 0732 11/21/20 1130 11/21/20 1500 11/21/20 2022  BP: 130/90 106/66 122/76 117/68  Pulse: 82 93 90 91  Resp: 17 16 16 17   Temp: 97.9 F (36.6 C) 97.9 F (36.6 C) 98 F (36.7 C) 98.4 F (36.9 C)  TempSrc:   Oral Oral  SpO2: 96% 98% 95% 99%  Weight:      Height:        Intake/Output Summary (Last 24 hours) at 11/21/2020 2110 Last data filed at 11/21/2020 1609 Gross per 24 hour  Intake 549.93 ml  Output 3650 ml  Net -3100.07 ml   Filed Weights   11/14/20 2052  Weight: 63.5 kg    Examination:  General exam: Patient is moaning and groaning at times, still does not follow commands Respiratory system: Clear to auscultation. Respiratory effort normal. Cardiovascular system:RRR. No murmurs, rubs, gallops. Gastrointestinal system: Abdomen is nondistended, soft and nontender. No organomegaly or masses felt. Normal bowel sounds heard. Central nervous system: He is moving around more, still does not follow commands no focal neurological deficits. Extremities: No C/C/E, +pedal pulses Skin: No rashes, lesions or ulcers Psychiatry: Still lethargic       Data Reviewed: I have personally reviewed following labs and imaging studies  CBC: Recent Labs  Lab 11/17/20 0641 11/17/20 1554 11/18/20 0254 11/19/20 0623 11/20/20 0434 11/21/20 0412  WBC 5.7 6.7 6.4 18.2* 5.4 7.0  NEUTROABS 3.8  --   --   --   --   --   HGB 10.5* 11.4* 10.3* 10.2* 9.0* 11.1*  HCT 32.1* 34.1* 31.1* 31.0* 27.3* 32.7*  MCV 101.6* 99.1 100.0 104.7* 102.2* 98.2  PLT 114* 125* 115* 121* 93* 76*   Basic Metabolic Panel: Recent Labs  Lab 11/17/20 0641 11/18/20 0254 11/19/20 0623 11/20/20 0434 11/21/20 0412  NA 145 147* 148* 142 135  K 3.9 3.5 3.4* 3.7 3.9  CL 113* 116* 119* 113*  109  CO2 26 24 23 23 22   GLUCOSE 127* 98 89 108* 104*  BUN 18 14 13 13 11   CREATININE 0.66 0.57* 0.91 0.77  0.72 0.57*  CALCIUM 8.4* 8.3* 8.4* 7.7* 7.7*  MG 2.0  --   --   --   --   PHOS 2.8 2.6 1.7* 3.2  --    GFR: Estimated Creatinine Clearance: 81.6 mL/min (A) (by C-G formula based on SCr of 0.57 mg/dL (L)). Liver Function Tests: Recent Labs  Lab 11/18/20 0254 11/19/20 0623 11/20/20 0434 11/21/20 0412  AST 69*  --   --  67*  ALT 35  --   --  36  ALKPHOS 59  --   --  69  BILITOT 0.8  --   --  1.4*  PROT 5.4*  --   --  5.4*  ALBUMIN 2.2* 2.6* 2.3* 2.3*   No results for input(s): LIPASE, AMYLASE in the last 168 hours.  Recent Labs  Lab 11/16/20 1139 11/17/20 0838 11/20/20 1200  AMMONIA 63* 23 27   Coagulation Profile: Recent Labs  Lab 11/15/20 0529  INR 1.5*   Cardiac Enzymes: No results for input(s): CKTOTAL, CKMB, CKMBINDEX, TROPONINI in the last 168 hours.  BNP (last 3 results) No results for input(s): PROBNP in the last 8760 hours. HbA1C: No results for input(s): HGBA1C in the last 72 hours. CBG: Recent Labs  Lab 11/21/20 0554 11/21/20 0731 11/21/20 1130 11/21/20 1622 11/21/20 2016  GLUCAP 99 95 104* 96 96   Lipid Profile: No results for input(s): CHOL, HDL, LDLCALC, TRIG, CHOLHDL, LDLDIRECT in the last 72 hours. Thyroid Function Tests: No results for input(s): TSH, T4TOTAL, FREET4, T3FREE, THYROIDAB in the last 72 hours.  Anemia Panel: No results for input(s): VITAMINB12, FOLATE, FERRITIN, TIBC, IRON, RETICCTPCT in the last 72 hours.  Sepsis Labs: Recent Labs  Lab 11/14/20 2148 11/15/20 0150 11/15/20 0529 11/20/20 0434  PROCALCITON  --   --  0.43 1.30  LATICACIDVEN 2.5* 2.4*  --   --     Recent Results (from the past 240 hour(s))  Blood culture (routine single)     Status: None   Collection Time: 11/14/20  8:08 PM   Specimen: BLOOD  Result Value Ref Range Status   Specimen Description BLOOD RIGHT ANTECUBITAL  Final   Special  Requests   Final    BOTTLES DRAWN AEROBIC AND ANAEROBIC Blood Culture adequate volume   Culture   Final    NO GROWTH 5 DAYS Performed at [facility], [address] [place], [place] [address]    Report Status 11/19/2020 FINAL  Final  Urine Culture     Status: Abnormal   Collection Time: 11/14/20  8:08 PM   Specimen: Urine, Random  Result Value Ref Range Status   Specimen Description   Final    URINE, RANDOM Performed at [facility], [address] [place], [place] [address]    Special Requests   Final    NONE Performed at [facility], [address] [place], [place] [address]    Culture >=100,000 COLONIES/mL STAPHYLOCOCCUS EPIDERMIDIS (A)  Final   Report Status 11/17/2020 FINAL  Final   Organism ID, Bacteria STAPHYLOCOCCUS EPIDERMIDIS (A)  Final      Susceptibility   Staphylococcus epidermidis - MIC*    CIPROFLOXACIN 2 INTERMEDIATE Intermediate     GENTAMICIN <=0.5 SENSITIVE Sensitive     NITROFURANTOIN <=16 SENSITIVE Sensitive     OXACILLIN <=0.25 SENSITIVE Sensitive     TETRACYCLINE <=1 SENSITIVE Sensitive     VANCOMYCIN 2 SENSITIVE Sensitive     TRIMETH/SULFA 80 RESISTANT Resistant     CLINDAMYCIN <=0.25 SENSITIVE Sensitive     RIFAMPIN <=0.5 SENSITIVE Sensitive     Inducible Clindamycin NEGATIVE Sensitive     * >=100,000 COLONIES/mL STAPHYLOCOCCUS EPIDERMIDIS  Resp Panel by RT-PCR (Flu A&B, Covid) Nasopharyngeal Swab     Status: None   Collection Time: 11/14/20  9:45 PM   Specimen: Nasopharyngeal Swab; Nasopharyngeal(NP) swabs in vial transport medium  Result Value Ref Range Status   SARS Coronavirus 2 by RT PCR NEGATIVE NEGATIVE Final    Comment: (NOTE) SARS-CoV-2 target nucleic acids are NOT DETECTED.  The SARS-CoV-2 RNA is generally detectable in upper respiratory specimens during the acute phase of infection. The lowest concentration of SARS-CoV-2 viral copies this assay can detect is 138 copies/mL. A negative result does not  preclude SARS-Cov-2 infection and should not be used as the sole basis for treatment or other patient management decisions. A negative result may occur with  improper specimen collection/handling, submission of specimen other than nasopharyngeal swab, presence of viral mutation(s) within the areas targeted by this assay, and inadequate number of viral copies(<138 copies/mL). A negative  result must be combined with clinical observations, patient history, and epidemiological information. The expected result is Negative.  Fact Sheet for Patients:  BloggerCourse.com  Fact Sheet for Healthcare Providers:  SeriousBroker.it  This test is no t yet approved or cleared by the [place] FDA and  has been authorized for detection and/or diagnosis of SARS-CoV-2 by FDA under an Emergency Use Authorization (EUA). This EUA will remain  in effect (meaning this test can be used) for the duration of the COVID-19 declaration under Section 564(b)(1) of the Act, 21 U.S.C.section 360bbb-3(b)(1), unless the authorization is terminated  or revoked sooner.       Influenza A by PCR NEGATIVE NEGATIVE Final   Influenza B by PCR NEGATIVE NEGATIVE Final    Comment: (NOTE) The Xpert Xpress SARS-CoV-2/FLU/RSV plus assay is intended as an aid in the diagnosis of influenza from Nasopharyngeal swab specimens and should not be used as a sole basis for treatment. Nasal washings and aspirates are unacceptable for Xpert Xpress SARS-CoV-2/FLU/RSV testing.  Fact Sheet for Patients: BloggerCourse.com  Fact Sheet for Healthcare Providers: SeriousBroker.it  This test is not yet approved or cleared by the [place] FDA and has been authorized for detection and/or diagnosis of SARS-CoV-2 by FDA under an Emergency Use Authorization (EUA). This EUA will remain in effect (meaning this test can be used) for the  duration of the COVID-19 declaration under Section 564(b)(1) of the Act, 21 U.S.C. section 360bbb-3(b)(1), unless the authorization is terminated or revoked.  Performed at [facility], [address] [place], [place] [address]   Culture, blood (single) w Reflex to ID Panel     Status: None   Collection Time: 11/15/20  1:50 AM   Specimen: BLOOD  Result Value Ref Range Status   Specimen Description BLOOD BLOOD RIGHT HAND  Final   Special Requests   Final    BOTTLES DRAWN AEROBIC AND ANAEROBIC Blood Culture adequate volume   Culture   Final    NO GROWTH 5 DAYS Performed at [facility], [address] [place], [place] [address]    Report Status 11/20/2020 FINAL  Final  MRSA Next Gen by PCR, Nasal     Status: None   Collection Time: 11/15/20  1:54 AM   Specimen: Nasal Mucosa; Nasal Swab  Result Value Ref Range Status   MRSA by PCR Next Gen NOT DETECTED NOT DETECTED Final    Comment: (NOTE) The GeneXpert MRSA Assay (FDA approved for NASAL specimens only), is one component of a comprehensive MRSA colonization surveillance program. It is not intended to diagnose MRSA infection nor to guide or monitor treatment for MRSA infections. Test performance is not FDA approved in patients less than 29 years old. Performed at [facility], [address] [place], [place] [address]   CULTURE, BLOOD (ROUTINE X 2) w Reflex to ID Panel     Status: Abnormal (Preliminary result)   Collection Time: 11/19/20 10:35 AM   Specimen: BLOOD  Result Value Ref Range Status   Specimen Description   Final    BLOOD BLOOD LEFT HAND Performed at [facility], [address] [place], [place] [address]    Special Requests   Final    BOTTLES DRAWN AEROBIC AND ANAEROBIC Blood Culture adequate volume Performed at [facility], [address], [place], [place] [address]    Culture  Setup Time   Final    GRAM NEGATIVE RODS AEROBIC BOTTLE ONLY CRITICAL RESULT  CALLED TO, READ BACK BY  AND VERIFIED WITH: JASON ROBBINS AT 0500 11/20/20.PMF    Culture (A)  Final    PSEUDOMONAS AERUGINOSA SUSCEPTIBILITIES TO FOLLOW Performed at [facility], [address], [place], [place] [address]    Report Status PENDING  Incomplete  Blood Culture ID Panel (Reflexed)     Status: Abnormal   Collection Time: 11/19/20 10:35 AM  Result Value Ref Range Status   Enterococcus faecalis NOT DETECTED NOT DETECTED Final   Enterococcus Faecium NOT DETECTED NOT DETECTED Final   Listeria monocytogenes NOT DETECTED NOT DETECTED Final   Staphylococcus species NOT DETECTED NOT DETECTED Final   Staphylococcus aureus (BCID) NOT DETECTED NOT DETECTED Final   Staphylococcus epidermidis NOT DETECTED NOT DETECTED Final   Staphylococcus lugdunensis NOT DETECTED NOT DETECTED Final   Streptococcus species NOT DETECTED NOT DETECTED Final   Streptococcus agalactiae NOT DETECTED NOT DETECTED Final   Streptococcus pneumoniae NOT DETECTED NOT DETECTED Final   Streptococcus pyogenes NOT DETECTED NOT DETECTED Final   A.calcoaceticus-baumannii NOT DETECTED NOT DETECTED Final   Bacteroides fragilis NOT DETECTED NOT DETECTED Final   Enterobacterales NOT DETECTED NOT DETECTED Final   Enterobacter cloacae complex NOT DETECTED NOT DETECTED Final   Escherichia coli NOT DETECTED NOT DETECTED Final   Klebsiella aerogenes NOT DETECTED NOT DETECTED Final   Klebsiella oxytoca NOT DETECTED NOT DETECTED Final   Klebsiella pneumoniae NOT DETECTED NOT DETECTED Final   Proteus species NOT DETECTED NOT DETECTED Final   Salmonella species NOT DETECTED NOT DETECTED Final   Serratia marcescens NOT DETECTED NOT DETECTED Final   Haemophilus influenzae NOT DETECTED NOT DETECTED Final   Neisseria meningitidis NOT DETECTED NOT DETECTED Final   Pseudomonas aeruginosa DETECTED (A) NOT DETECTED Final    Comment: CRITICAL RESULT CALLED TO, READ BACK BY AND VERIFIED WITH: JASON ROBBINS AT 0500 11/20/20.PMF     Stenotrophomonas maltophilia NOT DETECTED NOT DETECTED Final   Candida albicans NOT DETECTED NOT DETECTED Final   Candida auris NOT DETECTED NOT DETECTED Final   Candida glabrata NOT DETECTED NOT DETECTED Final   Candida krusei NOT DETECTED NOT DETECTED Final   Candida parapsilosis NOT DETECTED NOT DETECTED Final   Candida tropicalis NOT DETECTED NOT DETECTED Final   Cryptococcus neoformans/gattii NOT DETECTED NOT DETECTED Final   CTX-M ESBL NOT DETECTED NOT DETECTED Final   Carbapenem resistance IMP NOT DETECTED NOT DETECTED Final   Carbapenem resistance KPC NOT DETECTED NOT DETECTED Final   Carbapenem resistance NDM NOT DETECTED NOT DETECTED Final   Carbapenem resistance VIM NOT DETECTED NOT DETECTED Final    Comment: Performed at [facility], [address] [place], [place] [address]  CULTURE, BLOOD (ROUTINE X 2) w Reflex to ID Panel     Status: None (Preliminary result)   Collection Time: 11/19/20 10:40 AM   Specimen: BLOOD  Result Value Ref Range Status   Specimen Description BLOOD BLOOD RIGHT WRIST  Final   Special Requests   Final    BOTTLES DRAWN AEROBIC AND ANAEROBIC Blood Culture adequate volume   Culture   Final    NO GROWTH 2 DAYS Performed at [facility], [address], [place], [place] [address]    Report Status PENDING  Incomplete         Radiology Studies: No results found.      Scheduled Meds:  budesonide (PULMICORT) nebulizer solution  0.25 mg Nebulization BID   Chlorhexidine Gluconate Cloth  6 each Topical Daily   feeding supplement  237 mL Oral BID BM  folic acid  1 mg Oral Daily   ipratropium-albuterol  3 mL Nebulization TID   pantoprazole (PROTONIX) IV  40 mg Intravenous Q24H   Continuous Infusions:  sodium chloride 250 mL (11/17/20 1235)   ceFEPime (MAXIPIME) IV 2 g (11/21/20 1500)   dextrose 5 % with KCl 20 mEq / L 20 mEq (11/21/20 1609)   sodium chloride Stopped (11/19/20 1107)   thiamine injection Stopped (11/20/20  2254)     LOS: 7 days    Time spent: 35 mins    Erick Blinks, MD [facility]   If 7PM-7AM, please contact night-coverage [email or web address]  11/21/2020, 9:10 PM

## 2020-11-22 ENCOUNTER — Inpatient Hospital Stay (HOSPITAL_COMMUNITY)
Admit: 2020-11-22 | Discharge: 2020-11-22 | Disposition: A | Discharge status: Home / Self Care | Payer: Medicare Other | Attending: Internal Medicine | Admitting: Internal Medicine

## 2020-11-22 DIAGNOSIS — I350 Nonrheumatic aortic (valve) stenosis: Secondary | ICD-10-CM

## 2020-11-22 DIAGNOSIS — B965 Pseudomonas (aeruginosa) (mallei) (pseudomallei) as the cause of diseases classified elsewhere: Secondary | ICD-10-CM | POA: Diagnosis not present

## 2020-11-22 DIAGNOSIS — E43 Unspecified severe protein-calorie malnutrition: Secondary | ICD-10-CM | POA: Diagnosis not present

## 2020-11-22 DIAGNOSIS — G9341 Metabolic encephalopathy: Secondary | ICD-10-CM

## 2020-11-22 DIAGNOSIS — R7881 Bacteremia: Secondary | ICD-10-CM

## 2020-11-22 DIAGNOSIS — I509 Heart failure, unspecified: Secondary | ICD-10-CM

## 2020-11-22 DIAGNOSIS — R6521 Severe sepsis with septic shock: Secondary | ICD-10-CM | POA: Diagnosis not present

## 2020-11-22 DIAGNOSIS — N139 Obstructive and reflux uropathy, unspecified: Secondary | ICD-10-CM | POA: Diagnosis not present

## 2020-11-22 LAB — ECHOCARDIOGRAM COMPLETE
AR max vel: 1.43 cm2
AV Area VTI: 1.71 cm2
AV Area mean vel: 1.53 cm2
AV Mean grad: 9 mmHg
AV Peak grad: 16.6 mmHg
Ao pk vel: 2.04 m/s
Area-P 1/2: 4.29 cm2
Height: 66 in
MV VTI: 3 cm2
S' Lateral: 2.4 cm
Weight: 2240 oz

## 2020-11-22 LAB — COMPREHENSIVE METABOLIC PANEL
ALT: 30 U/L (ref 0–44)
AST: 52 U/L — ABNORMAL HIGH (ref 15–41)
Albumin: 2.2 g/dL — ABNORMAL LOW (ref 3.5–5.0)
Alkaline Phosphatase: 73 U/L (ref 38–126)
Anion gap: 5 (ref 5–15)
BUN: 10 mg/dL (ref 8–23)
CO2: 21 mmol/L — ABNORMAL LOW (ref 22–32)
Calcium: 8.1 mg/dL — ABNORMAL LOW (ref 8.9–10.3)
Chloride: 108 mmol/L (ref 98–111)
Creatinine, Ser: 0.64 mg/dL (ref 0.61–1.24)
GFR, Estimated: >60 mL/min (ref >60)
Glucose, Bld: 97 mg/dL (ref 70–99)
Potassium: 3.6 mmol/L (ref 3.5–5.1)
Sodium: 134 mmol/L — ABNORMAL LOW (ref 135–145)
Total Bilirubin: 1.7 mg/dL — ABNORMAL HIGH (ref 0.3–1.2)
Total Protein: 5.7 g/dL — ABNORMAL LOW (ref 6.5–8.1)

## 2020-11-22 LAB — CBC
HCT: 33.7 % — ABNORMAL LOW (ref 39.0–52.0)
Hemoglobin: 11.1 g/dL — ABNORMAL LOW (ref 13.0–17.0)
MCH: 32.1 pg (ref 26.0–34.0)
MCHC: 32.9 g/dL (ref 30.0–36.0)
MCV: 97.4 fL (ref 80.0–100.0)
Platelets: 77 10*3/uL — ABNORMAL LOW (ref 150–400)
RBC: 3.46 MIL/uL — ABNORMAL LOW (ref 4.22–5.81)
RDW: 15.5 % (ref 11.5–15.5)
WBC: 6.5 10*3/uL (ref 4.0–10.5)
nRBC: 0 % (ref 0.0–0.2)

## 2020-11-22 LAB — GLUCOSE, CAPILLARY
Glucose-Capillary: 121 mg/dL — ABNORMAL HIGH (ref 70–99)
Glucose-Capillary: 107 mg/dL — ABNORMAL HIGH (ref 70–99)
Glucose-Capillary: 113 mg/dL — ABNORMAL HIGH (ref 70–99)
Glucose-Capillary: 98 mg/dL (ref 70–99)

## 2020-11-22 LAB — CULTURE, BLOOD (ROUTINE X 2): Special Requests: ADEQUATE

## 2020-11-22 LAB — VITAMIN B1: Vitamin B1 (Thiamine): 225.8 nmol/L — ABNORMAL HIGH (ref 66.5–200.0)

## 2020-11-22 MED ORDER — CEFTAZIDIME 2 G IV SOLR
2.0000 g | Freq: Three times a day (TID) | INTRAVENOUS | Status: DC
  Filled 2020-11-22 (×2): qty 2

## 2020-11-22 MED ORDER — MORPHINE SULFATE (PF) 2 MG/ML IV SOLN
1.0000 mg | Freq: Once | INTRAVENOUS | Status: DC
Start: 2020-11-22 — End: 2020-11-24

## 2020-11-22 MED ORDER — SODIUM CHLORIDE 0.9 % IV SOLN
1.0000 mg | Freq: Every day | INTRAVENOUS | Status: DC
  Administered 2020-11-22 – 2020-11-24 (×3): 1 mg via INTRAVENOUS
  Filled 2020-11-22 (×4): qty 0.2

## 2020-11-22 MED ORDER — SODIUM CHLORIDE 0.9 % IV SOLN
2.0000 g | Freq: Three times a day (TID) | INTRAVENOUS | Status: AC
  Administered 2020-11-22 – 2020-11-30 (×25): 2 g via INTRAVENOUS
  Filled 2020-11-22 (×26): qty 2

## 2020-11-22 NOTE — Plan of Care (Signed)

## 2020-11-22 NOTE — Progress Notes (Signed)
Attempted to check pt's swallow function twice today and both times, he was too lethargic and would just hold the food in his mouth, not even flinching that he received anything. Dr. Kerry Hough aware.

## 2020-11-22 NOTE — Consult Note (Signed)
Pharmacy Antibiotic Note  Dustin Vazquez is a 65 y.o. male admitted on 11/14/2020 with bacteremia.  Pharmacy has been consulted for ceftazidime dosing.  Plan: Will dose ceftazidime @2gms  q8hrs based on current renal function.  Will monitor for possible changes to Scr.  Height: 5\' 6"  (167.6 cm) Weight: 63.5 kg (140 lb) IBW/kg (Calculated) : 63.8  Temp (24hrs), Avg:98.5 F (36.9 C), Min:97.8 F (36.6 C), Max:99.5 F (37.5 C)  Recent Labs  Lab 11/18/20 0254 11/19/20 0623 11/20/20 0434 11/21/20 0412 11/22/20 0419  WBC 6.4 18.2* 5.4 7.0 6.5  CREATININE 0.57* 0.91 0.77  0.72 0.57* 0.64    Estimated Creatinine Clearance: 81.6 mL/min (by C-G formula based on SCr of 0.64 mg/dL).    Allergies  Allergen Reactions   Trazodone Anxiety    Antimicrobials this admission: 09/11-09/13  Ceftriaxone 2 gms x 2 doses 09/13-09/14  Augmentin 875/125mg  x1 dose 09/13-09/14  Azithromycin 500mg  x 1 dose followed  By 250mg  x 1 dose 09/14-09/19  Ampicillin-sulbactam 3gms 09/16-09/19  Cefepime 2gms  Microbiology results:  09/16 BCx: pseudomonas aeruginosa 09/11 UCx: staph epi  09/12 MRSA PCR: not detected  Thank you for allowing pharmacy to be a part of this patient's care.  06-18-1974 11/22/2020 6:32 PM

## 2020-11-22 NOTE — Progress Notes (Signed)
*  PRELIMINARY RESULTS* Echocardiogram 2D Echocardiogram has been performed.  Dustin Vazquez Novello 11/22/2020, 9:07 AM

## 2020-11-22 NOTE — Progress Notes (Signed)
   11/22/20 1142  Assess: MEWS Score  Temp 98 F (36.7 C)  BP 100/70  Pulse Rate 97  Resp 17  Level of Consciousness Responds to Voice  SpO2 99 %  O2 Device Room Air  Assess: MEWS Score  MEWS Temp 0  MEWS Systolic 1  MEWS Pulse 0  MEWS RR 0  MEWS LOC 1  MEWS Score 2  MEWS Score Color Yellow  Assess: if the MEWS score is Yellow or Red  Were vital signs taken at a resting state? Yes  Focused Assessment No change from prior assessment  Does the patient meet 2 or more of the SIRS criteria? No  MEWS guidelines implemented *See Row Information* No, altered LOC is baseline  Treat  MEWS Interventions Administered scheduled meds/treatments  Pain Scale Faces  Faces Pain Scale 0  Pain Intervention(s) Repositioned;Emotional support  Take Vital Signs  Increase Vital Sign Frequency  Yellow: Q 2hr X 2 then Q 4hr X 2, if remains yellow, continue Q 4hrs  Escalate  MEWS: Escalate Yellow: discuss with charge nurse/RN and consider discussing with provider and RRT  Notify: Charge Nurse/RN  Name of Charge Nurse/RN Notified Judeth Cornfield  Date Charge Nurse/RN Notified 11/22/20  Notify: Provider  Provider Name/Title Memon  Date Provider Notified 11/22/20  Provider response Evaluate remotely  Date of Provider Response 11/22/20  Document  Patient Outcome Other (Comment) (No change as this is his baseline)  Assess: SIRS CRITERIA  SIRS Temperature  0  SIRS Pulse 1  SIRS Respirations  0  SIRS WBC 0  SIRS Score Sum  1

## 2020-11-22 NOTE — Progress Notes (Signed)
Pharmacy Antibiotic Note  Dustin Vazquez is a 65 y.o. male with PMH of COPD, tobacco abuse who was admitted on 11/14/2020 with sepsis. Pharmacy has been consulted for Cefepime dosing.  Pt has been afebrile >48h, WBC 18.2>5.4>7.0>6.5  Scr 2.01>1.48>0.66>0.57>0.91>0.72>0.57>0.64  Pseudomonas growing in 1 of 4 bottles (aerobic) and was shown to be sensitive for ceftazidime.    Plan: Cefepime  --Will continue cefepime 2 gm IV Q8H   Will continue to monitor renal function and adjust as clinically indicated  Height: 5\' 6"  (167.6 cm) Weight: 63.5 kg (140 lb) IBW/kg (Calculated) : 63.8  Temp (24hrs), Avg:98.5 F (36.9 C), Min:97.8 F (36.6 C), Max:99.5 F (37.5 C)  Recent Labs  Lab 11/18/20 0254 11/19/20 0623 11/20/20 0434 11/21/20 0412 11/22/20 0419  WBC 6.4 18.2* 5.4 7.0 6.5  CREATININE 0.57* 0.91 0.77  0.72 0.57* 0.64     Estimated Creatinine Clearance: 81.6 mL/min (by C-G formula based on SCr of 0.64 mg/dL).    Allergies  Allergen Reactions   Trazodone Anxiety    Antimicrobials this admission: Ongoing  Cefepime  9/17>  Completed Ceftriaxone 9/11>9/12 Azithromycin 9/13>9/16 Amoxicillin-clavulanate 9/13>9/13 Ampicillin-sulbactam 9/14>9/17 Vancomycin 9/16>9/17  Dose adjustments this admission:   Microbiology results:  BCx: pseudomonas 1/4 bottles   UCx: >100,000 CFU staph epi   BCID: pseudomonas detected     MRSA PCR: not detected   Thank you for allowing pharmacy to be a part of this patient's care.  10/16, PharmD Pharmacy Resident  11/22/2020 2:30 PM

## 2020-11-22 NOTE — TOC Progression Note (Signed)
Transition of Care [facility]) - Progression Note    Patient Details  Name: ARKIN IMRAN MRN: [id number] Date of Birth: 1955-11-13  Transition of Care [facility]) CM/SW Contact  Hetty Ely, RN Phone Number: 11/22/2020, 4:22 PM  Clinical Narrative:  Spoke with Son, [place] who wanted to confirm that patient had DNR code status.Son given confirmation that DNR is designated, verbalizes understanding did want to see if there ws a Chaplain available, was informed that we did have Chaplain services if needed. No other questions asked. Son was assured that he would be called if any changes occurs in patient condition.     Expected Discharge Plan: Skilled Nursing Facility Barriers to Discharge: Barriers Unresolved (comment) (No family members or supports)  Expected Discharge Plan and Services Expected Discharge Plan: Skilled Nursing Facility In-house Referral: Clinical Social Work     Living arrangements for the past 2 months: No permanent address                                       Social Determinants of Health (SDOH) Interventions    Readmission Risk Interventions No flowsheet data found.

## 2020-11-22 NOTE — Progress Notes (Signed)
Nutrition Follow-up  DOCUMENTATION CODES:   Severe malnutrition in context of social or environmental circumstances  INTERVENTION:   Ensure Enlive po BID, each supplement provides 350 kcal and 20 grams of protein  If nasogastric tube placed, recommend:  Osmolite 1.5 @60ml /hr- Initiate at 27m/hr and increase by 11mhr q 8 hours until goal rate is reached.   Free water flushes 3060m4 hours to maintain tube patency   Regimen provides 2160kcal/day, 90g/day protein and 1277m23my free water   Pt at high refeed risk; recommend monitor potassium, magnesium and phosphorus labs daily until stable  NUTRITION DIAGNOSIS:   Severe Malnutrition (in the context of social/environmental circumstances) related to  (inadequate energy intake) as evidenced by severe fat depletion, severe muscle depletion. -ongoing   GOAL:   Patient will meet greater than or equal to 90% of their needs -not met   MONITOR:   Diet advancement, Labs, Weight trends, Skin, I & O's  ASSESSMENT:   65 y51. male with hx of COPD brought to ED after pt was found down on the side of the road in the rain. Found to be hypotensive, hypothermic, and with AMS.  Pt continues to have AMS and confusion. Pt has been unable to tolerate oral intake and is now without adequate nutrition for > 7 days. Spoke with MD, pt's mental status is slightly improved today; MD will plan to discuss nasogastric tube and nutrition support with family tomorrow if pt does not improve enough to eat. Pt is at high refeed risk. No new weight since 9/11; will request daily weights.   Medications reviewed and include: folic acid, protonix, cefepime, 5% dextrose w/ KCl @100ml /hr, thiamine   Labs reviewed: Na 134(L), K 3.6 wnl, tbili 1.7(H) cbgs- 121, 107 x 24 hrs  Diet Order:   Diet Order             Diet NPO time specified  Diet effective now                  EDUCATION NEEDS:   No education needs have been identified at this time  Skin:   Skin Assessment: Reviewed RN Assessment  Last BM:  9/19- type 6  Height:   Ht Readings from Last 1 Encounters:  11/14/20 5' 6"  (1.676 m)    Weight:   Wt Readings from Last 1 Encounters:  11/14/20 63.5 kg    Ideal Body Weight:  64.5 kg  BMI:  Body mass index is 22.6 kg/m.  Estimated Nutritional Needs:   Kcal:  1800-2100kcal/day  Protein:  90-105g/day  Fluid:  1.6-1.9L/day  CaseKoleen Distance RD, LDN Please refer to [facility] RD and/or RD on-call/weekend/after hours pager

## 2020-11-22 NOTE — Progress Notes (Signed)
Palliative:  Palliative consult noted and received. My colleague attempted to reach family without success last week. Patient slightly more responsive/alert per attending. Discussed with Dr. Kerry Hough. Will follow progress in coming days and intervene as needed if no significant improvement.   No charge  Yong Channel, NP Palliative Medicine Team Pager [phone] (Please see [email or web address] for schedule) Team Phone [phone]

## 2020-11-22 NOTE — Progress Notes (Signed)
PROGRESS NOTE    LEW SHARAF  IFO:[id number] DOB: August 23, 1955 DOA: 11/14/2020 PCP: [facility]    Brief Narrative:  65 year old male is brought to the hospital after he was found on the side of the road with altered mental status.  Noted to be hypotensive, hypothermic on arrival with elevated creatinine, liver enzymes and lactic acid.  Noted to have urinary tract infection and was admitted to the hospital for septic shock requiring vasopressors.  He was admitted to the ICU for further management.  Transferred to [facility] on 9/14   Assessment & Plan:   Active Problems:   Severe sepsis (HCC)   Severe sepsis with septic shock (CODE) (HCC)   Protein-calorie malnutrition, severe   Bacteremia due to Pseudomonas   Alcoholic cirrhosis of liver without ascites (HCC)   Acute metabolic encephalopathy   Thrombocytopenia (HCC)   Acute urinary retention   Hematuria   Acute lower UTI   Sepsis with septic shock secondary to urinary tract infection/Pseudomonas bacteremia -He has been weaned off of vasopressors -Initially on Unasyn, subsequently transition to cefepime for Pseudomonas coverage -Based on culture sensitivities, cefepime changed to ceftazidime -Urine culture positive for staph epidermidis -1 out of 4 blood cultures positive for Pseudomonas -Infectious diease consult  Acute metabolic encephalopathy -Etiology is not entirely clear -May have a component related to underlying infection -He does have a history of alcoholism and may have a component of Wernicke's, he has been receiving high-dose thiamine -Ammonia level normal  -CT head negative x2 -MRI brain unremarkable -EEG without evidence of epileptiform discharges -Overall electrolyte abnormalities have corrected -He has had mild improvement, so will continue observation for now. If fails to make meaningful improvements, may need neuro input regarding further work up  History of alcohol abuse -Continue on CIWA  protocol  Alcoholic cirrhosis -Continue to follow LFTs -No evidence of ascites at this time  Possible aspiration -Noted to have coughing/rhonchi after taking p.o. meds -Once mental status has improved, can consider swallow evaluation, will keep n.p.o. for now -Chest x-ray with bilateral infiltrates, suspect developing pneumonia -Currently, he is not requiring any oxygen -PCT 1.3 -Continue on IV antibiotics  Thrombocytopenia -Likely multifactorial, related to acute infectious process/recent alcohol use/cirrhosis -Continue to monitor  Acute kidney injury -Secondary to hypovolemia -Overall renal function has improved with IV hydration  Urinary retention -Noted to have significant hematuria -Urology consulted and coud catheter was placed -Recommended to consider removing catheter once patient is more awake and alert  COPD -No wheezing at this time -Continue bronchodilators and inhaled steroids  Goals of care -Patient was living independently in a camper -Discussed CODE STATUS with patient's son who confirmed DNR -He is overall malnourished and may have difficulty making a meaningful recovery -Palliative care consulted to further address goals of care   DVT prophylaxis:   SCDs  Code Status: DNR, confirmed with patient's son Family Communication: Updated patient's son over the phone 9/19 Disposition Plan: Status is: Inpatient  Remains inpatient appropriate because:Hemodynamically unstable, Altered mental status, IV treatments appropriate due to intensity of illness or inability to take PO, and Inpatient level of care appropriate due to severity of illness  Dispo: The patient is from: Home              Anticipated d/c is to:  TBD              Patient currently is not medically stable to d/c.   Difficult to place patient No  Consultants:  PCCM Urology Palliative care Infectious disease  Procedures:    Antimicrobials:  Unasyn 9/14> 9/17 Azithromycin  9/13> 9/17 Cefepime 9/17>9/19 Ceftazidime 9/19>   Subjective: Patient is still lethargic, but mildly better today. He is awake and moving his extremities, but does not respond to questions or follow commands. Objective: Vitals:   11/22/20 0804 11/22/20 1142 11/22/20 1638 11/22/20 1958  BP: 101/68 100/70 112/72   Pulse: 93 97 78   Resp: Temp: 97.8 F (36.6 C) 98 F (36.7 C) 98 F (36.7 C)   TempSrc: Axillary Axillary Axillary   SpO2: 97% 99% 100% 100%  Weight:      Height:        Intake/Output Summary (Last 24 hours) at 11/22/2020 2016 Last data filed at 11/22/2020 1754 Gross per 24 hour  Intake 0 ml  Output 3900 ml  Net -3900 ml   Filed Weights   11/14/20 2052  Weight: 63.5 kg    Examination:  General exam: awake, no distress Respiratory system: Clear to auscultation. Respiratory effort normal. Cardiovascular system:RRR. No murmurs, rubs, gallops. Gastrointestinal system: Abdomen is nondistended, soft and nontender. No organomegaly or masses felt. Normal bowel sounds heard. Central nervous system: still lethargic, but moving his extremities and appears awake.  Extremities: No C/C/E, +pedal pulses Skin: No rashes, lesions or ulcers Psychiatry: nonverbal     Data Reviewed: I have personally reviewed following labs and imaging studies  CBC: Recent Labs  Lab 11/17/20 0641 11/17/20 1554 11/18/20 0254 11/19/20 0623 11/20/20 0434 11/21/20 0412 11/22/20 0419  WBC 5.7   < > 6.4 18.2* 5.4 7.0 6.5  NEUTROABS 3.8  --   --   --   --   --   --   HGB 10.5*   < > 10.3* 10.2* 9.0* 11.1* 11.1*  HCT 32.1*   < > 31.1* 31.0* 27.3* 32.7* 33.7*  MCV 101.6*   < > 100.0 104.7* 102.2* 98.2 97.4  PLT 114*   < > 115* 121* 93* 76* 77*   < > = values in this interval not displayed.   Basic Metabolic Panel: Recent Labs  Lab 11/17/20 0641 11/18/20 0254 11/19/20 0623 11/20/20 0434 11/21/20 0412 11/22/20 0419  NA 145 147* 148* 142 135 134*  K 3.9 3.5 3.4* 3.7 3.9  3.6  CL 113* 116* 119* 113* 109 108  CO2 21*  GLUCOSE 127* 98 89 108* 104* 97  BUN CREATININE 0.66 0.57* 0.91 0.77  0.72 0.57* 0.64  CALCIUM 8.4* 8.3* 8.4* 7.7* 7.7* 8.1*  MG 2.0  --   --   --   --   --   PHOS 2.8 2.6 1.7* 3.2  --   --    GFR: Estimated Creatinine Clearance: 81.6 mL/min (by C-G formula based on SCr of 0.64 mg/dL). Liver Function Tests: Recent Labs  Lab 11/18/20 0254 11/19/20 0623 11/20/20 0434 11/21/20 0412 11/22/20 0419  AST 69*  --   --  67* 52*  ALT 35  --   --  36 30  ALKPHOS 59  --   --  69 73  BILITOT 0.8  --   --  1.4* 1.7*  PROT 5.4*  --   --  5.4* 5.7*  ALBUMIN 2.2* 2.6* 2.3* 2.3* 2.2*   No results for input(s): LIPASE, AMYLASE in the last 168 hours.  Recent Labs  Lab 11/16/20 1139 11/17/20 0838 11/20/20 1200  AMMONIA 63* 23 27   Coagulation Profile: No results for input(s): INR, PROTIME in the last 168 hours.  Cardiac Enzymes: No results for input(s): CKTOTAL, CKMB, CKMBINDEX, TROPONINI in the last 168 hours.  BNP (last 3 results) No results for input(s): PROBNP in the last 8760 hours. HbA1C: No results for input(s): HGBA1C in the last 72 hours. CBG: Recent Labs  Lab 11/21/20 2016 11/21/20 2344 11/22/20 0805 11/22/20 1141 11/22/20 1640  GLUCAP 96 116* 121* 107* 113*   Lipid Profile: No results for input(s): CHOL, HDL, LDLCALC, TRIG, CHOLHDL, LDLDIRECT in the last 72 hours. Thyroid Function Tests: No results for input(s): TSH, T4TOTAL, FREET4, T3FREE, THYROIDAB in the last 72 hours.  Anemia Panel: No results for input(s): VITAMINB12, FOLATE, FERRITIN, TIBC, IRON, RETICCTPCT in the last 72 hours.  Sepsis Labs: Recent Labs  Lab 11/20/20 0434  PROCALCITON 1.30    Recent Results (from the past 240 hour(s))  Blood culture (routine single)     Status: None   Collection Time: 11/14/20  8:08 PM   Specimen: BLOOD  Result Value Ref Range Status   Specimen Description BLOOD RIGHT ANTECUBITAL   Final   Special Requests   Final    BOTTLES DRAWN AEROBIC AND ANAEROBIC Blood Culture adequate volume   Culture   Final    NO GROWTH 5 DAYS Performed at [facility], [address] [place], [place] [address]    Report Status 11/19/2020 FINAL  Final  Urine Culture     Status: Abnormal   Collection Time: 11/14/20  8:08 PM   Specimen: Urine, Random  Result Value Ref Range Status   Specimen Description   Final    URINE, RANDOM Performed at [facility], [address] [place], [place] [address]    Special Requests   Final    NONE Performed at [facility], [address], [place], [place] [address]    Culture >=100,000 COLONIES/mL STAPHYLOCOCCUS EPIDERMIDIS (A)  Final   Report Status 11/17/2020 FINAL  Final   Organism ID, Bacteria STAPHYLOCOCCUS EPIDERMIDIS (A)  Final      Susceptibility   Staphylococcus epidermidis - MIC*    CIPROFLOXACIN 2 INTERMEDIATE Intermediate     GENTAMICIN <=0.5 SENSITIVE Sensitive     NITROFURANTOIN <=16 SENSITIVE Sensitive     OXACILLIN <=0.25 SENSITIVE Sensitive     TETRACYCLINE <=1 SENSITIVE Sensitive     VANCOMYCIN 2 SENSITIVE Sensitive     TRIMETH/SULFA 80 RESISTANT Resistant     CLINDAMYCIN <=0.25 SENSITIVE Sensitive     RIFAMPIN <=0.5 SENSITIVE Sensitive     Inducible Clindamycin NEGATIVE Sensitive     * >=100,000 COLONIES/mL STAPHYLOCOCCUS EPIDERMIDIS  Resp Panel by RT-PCR (Flu A&B, Covid) Nasopharyngeal Swab     Status: None   Collection Time: 11/14/20  9:45 PM   Specimen: Nasopharyngeal Swab; Nasopharyngeal(NP) swabs in vial transport medium  Result Value Ref Range Status   SARS Coronavirus 2 by RT PCR NEGATIVE NEGATIVE Final    Comment: (NOTE) SARS-CoV-2 target nucleic acids are NOT DETECTED.  The SARS-CoV-2 RNA is generally detectable in upper respiratory specimens during the acute phase of infection. The lowest concentration of SARS-CoV-2 viral copies this assay can detect is 138 copies/mL. A negative  result does not preclude SARS-Cov-2 infection and should not be used as the sole basis for treatment or other patient management decisions. A negative result may occur with  improper specimen collection/handling, submission of specimen other than nasopharyngeal swab, presence of viral mutation(s) within the areas  targeted by this assay, and inadequate number of viral copies(<138 copies/mL). A negative result must be combined with clinical observations, patient history, and epidemiological information. The expected result is Negative.  Fact Sheet for Patients:  BloggerCourse.com  Fact Sheet for Healthcare Providers:  SeriousBroker.it  This test is no t yet approved or cleared by the [place] FDA and  has been authorized for detection and/or diagnosis of SARS-CoV-2 by FDA under an Emergency Use Authorization (EUA). This EUA will remain  in effect (meaning this test can be used) for the duration of the COVID-19 declaration under Section 564(b)(1) of the Act, 21 U.S.C.section 360bbb-3(b)(1), unless the authorization is terminated  or revoked sooner.       Influenza A by PCR NEGATIVE NEGATIVE Final   Influenza B by PCR NEGATIVE NEGATIVE Final    Comment: (NOTE) The Xpert Xpress SARS-CoV-2/FLU/RSV plus assay is intended as an aid in the diagnosis of influenza from Nasopharyngeal swab specimens and should not be used as a sole basis for treatment. Nasal washings and aspirates are unacceptable for Xpert Xpress SARS-CoV-2/FLU/RSV testing.  Fact Sheet for Patients: BloggerCourse.com  Fact Sheet for Healthcare Providers: SeriousBroker.it  This test is not yet approved or cleared by the [place] FDA and has been authorized for detection and/or diagnosis of SARS-CoV-2 by FDA under an Emergency Use Authorization (EUA). This EUA will remain in effect (meaning this test can be used)  for the duration of the COVID-19 declaration under Section 564(b)(1) of the Act, 21 U.S.C. section 360bbb-3(b)(1), unless the authorization is terminated or revoked.  Performed at [facility], [address] [place], [place] [address]   Culture, blood (single) w Reflex to ID Panel     Status: None   Collection Time: 11/15/20  1:50 AM   Specimen: BLOOD  Result Value Ref Range Status   Specimen Description BLOOD BLOOD RIGHT HAND  Final   Special Requests   Final    BOTTLES DRAWN AEROBIC AND ANAEROBIC Blood Culture adequate volume   Culture   Final    NO GROWTH 5 DAYS Performed at [facility], [address], [place], [place] [address]    Report Status 11/20/2020 FINAL  Final  MRSA Next Gen by PCR, Nasal     Status: None   Collection Time: 11/15/20  1:54 AM   Specimen: Nasal Mucosa; Nasal Swab  Result Value Ref Range Status   MRSA by PCR Next Gen NOT DETECTED NOT DETECTED Final    Comment: (NOTE) The GeneXpert MRSA Assay (FDA approved for NASAL specimens only), is one component of a comprehensive MRSA colonization surveillance program. It is not intended to diagnose MRSA infection nor to guide or monitor treatment for MRSA infections. Test performance is not FDA approved in patients less than 54 years old. Performed at [facility], [address] [place], [place] [address]   CULTURE, BLOOD (ROUTINE X 2) w Reflex to ID Panel     Status: Abnormal   Collection Time: 11/19/20 10:35 AM   Specimen: BLOOD  Result Value Ref Range Status   Specimen Description   Final    BLOOD BLOOD LEFT HAND Performed at [facility], [address], [place], [place] [address]    Special Requests   Final    BOTTLES DRAWN AEROBIC AND ANAEROBIC Blood Culture adequate volume Performed at [facility], [address], [place], [place] [address]    Culture  Setup Time   Final    GRAM NEGATIVE RODS  AEROBIC BOTTLE ONLY CRITICAL RESULT CALLED TO,  READ BACK BY AND VERIFIED WITH: JASON ROBBINS AT 0500 11/20/20.PMF Performed at [facility], [address] [place], [place] [address]    Culture PSEUDOMONAS AERUGINOSA (A)  Final   Report Status 11/22/2020 FINAL  Final   Organism ID, Bacteria PSEUDOMONAS AERUGINOSA  Final      Susceptibility   Pseudomonas aeruginosa - MIC*    CEFTAZIDIME 4 SENSITIVE Sensitive     CIPROFLOXACIN <=0.25 SENSITIVE Sensitive     GENTAMICIN <=1 SENSITIVE Sensitive     IMIPENEM 2 SENSITIVE Sensitive     PIP/TAZO 8 SENSITIVE Sensitive     CEFEPIME 2 SENSITIVE Sensitive     * PSEUDOMONAS AERUGINOSA  Blood Culture ID Panel (Reflexed)     Status: Abnormal   Collection Time: 11/19/20 10:35 AM  Result Value Ref Range Status   Enterococcus faecalis NOT DETECTED NOT DETECTED Final   Enterococcus Faecium NOT DETECTED NOT DETECTED Final   Listeria monocytogenes NOT DETECTED NOT DETECTED Final   Staphylococcus species NOT DETECTED NOT DETECTED Final   Staphylococcus aureus (BCID) NOT DETECTED NOT DETECTED Final   Staphylococcus epidermidis NOT DETECTED NOT DETECTED Final   Staphylococcus lugdunensis NOT DETECTED NOT DETECTED Final   Streptococcus species NOT DETECTED NOT DETECTED Final   Streptococcus agalactiae NOT DETECTED NOT DETECTED Final   Streptococcus pneumoniae NOT DETECTED NOT DETECTED Final   Streptococcus pyogenes NOT DETECTED NOT DETECTED Final   A.calcoaceticus-baumannii NOT DETECTED NOT DETECTED Final   Bacteroides fragilis NOT DETECTED NOT DETECTED Final   Enterobacterales NOT DETECTED NOT DETECTED Final   Enterobacter cloacae complex NOT DETECTED NOT DETECTED Final   Escherichia coli NOT DETECTED NOT DETECTED Final   Klebsiella aerogenes NOT DETECTED NOT DETECTED Final   Klebsiella oxytoca NOT DETECTED NOT DETECTED Final   Klebsiella pneumoniae NOT DETECTED NOT DETECTED Final   Proteus species NOT DETECTED NOT DETECTED Final   Salmonella species NOT DETECTED NOT DETECTED Final   Serratia  marcescens NOT DETECTED NOT DETECTED Final   Haemophilus influenzae NOT DETECTED NOT DETECTED Final   Neisseria meningitidis NOT DETECTED NOT DETECTED Final   Pseudomonas aeruginosa DETECTED (A) NOT DETECTED Final    Comment: CRITICAL RESULT CALLED TO, READ BACK BY AND VERIFIED WITH: JASON ROBBINS AT 0500 11/20/20.PMF    Stenotrophomonas maltophilia NOT DETECTED NOT DETECTED Final   Candida albicans NOT DETECTED NOT DETECTED Final   Candida auris NOT DETECTED NOT DETECTED Final   Candida glabrata NOT DETECTED NOT DETECTED Final   Candida krusei NOT DETECTED NOT DETECTED Final   Candida parapsilosis NOT DETECTED NOT DETECTED Final   Candida tropicalis NOT DETECTED NOT DETECTED Final   Cryptococcus neoformans/gattii NOT DETECTED NOT DETECTED Final   CTX-M ESBL NOT DETECTED NOT DETECTED Final   Carbapenem resistance IMP NOT DETECTED NOT DETECTED Final   Carbapenem resistance KPC NOT DETECTED NOT DETECTED Final   Carbapenem resistance NDM NOT DETECTED NOT DETECTED Final   Carbapenem resistance VIM NOT DETECTED NOT DETECTED Final    Comment: Performed at [facility], [address] [place], [place] [address]  CULTURE, BLOOD (ROUTINE X 2) w Reflex to ID Panel     Status: None (Preliminary result)   Collection Time: 11/19/20 10:40 AM   Specimen: BLOOD  Result Value Ref Range Status   Specimen Description BLOOD BLOOD RIGHT WRIST  Final   Special Requests   Final    BOTTLES DRAWN AEROBIC AND ANAEROBIC Blood Culture adequate volume   Culture  Final    NO GROWTH 3 DAYS Performed at [facility], [address] [place], [place] [address]    Report Status PENDING  Incomplete         Radiology Studies: ECHOCARDIOGRAM COMPLETE  Result Date: 11/22/2020    ECHOCARDIOGRAM REPORT   Patient Name:   Dustin Vazquez Date of Exam: 11/22/2020 Medical Rec #:  [id number]         Height:       66.0 in Accession #:    [id number]        Weight:       140.0 lb Date of Birth:   1955-12-30         BSA:          1.719 m Patient Age:    65 years          BP:           101/68 mmHg Patient Gender: M                 HR:           94 bpm. Exam Location:  [facility] Procedure: 2D Echo, Color Doppler and Cardiac Doppler Indications:     I50.9 Congestive Heart Failure  History:         Patient has no prior history of Echocardiogram examinations.                  COPD; Signs/Symptoms:Altered mental status.  Sonographer:     Humphrey Rolls Referring Phys:  [id number] [facility] Terra Aveni Diagnosing Phys: Lorine Bears MD  Sonographer Comments: Suboptimal parasternal window. Image acquisition challenging due to uncooperative patient and Image acquisition challenging due to COPD. IMPRESSIONS  1. Left ventricular ejection fraction, by estimation, is 55 to 60%. The left ventricle has normal function. The left ventricle has no regional wall motion abnormalities. Left ventricular diastolic parameters were normal.  2. Right ventricular systolic function is normal. The right ventricular size is normal. Tricuspid regurgitation signal is inadequate for assessing PA pressure.  3. The mitral valve is normal in structure. No evidence of mitral valve regurgitation. No evidence of mitral stenosis.  4. The aortic valve was not well visualized. Aortic valve regurgitation is not visualized. Mild aortic valve stenosis. Aortic valve area, by VTI measures 1.71 cm. Aortic valve mean gradient measures 9.0 mmHg.  5. The inferior vena cava is normal in size with greater than 50% respiratory variability, suggesting right atrial pressure of 3 mmHg. FINDINGS  Left Ventricle: Left ventricular ejection fraction, by estimation, is 55 to 60%. The left ventricle has normal function. The left ventricle has no regional wall motion abnormalities. The left ventricular internal cavity size was normal in size. There is  no left ventricular hypertrophy. Left ventricular diastolic parameters were normal. Right Ventricle: The right ventricular size is normal. No  increase in right ventricular wall thickness. Right ventricular systolic function is normal. Tricuspid regurgitation signal is inadequate for assessing PA pressure. Left Atrium: Left atrial size was normal in size. Right Atrium: Right atrial size was normal in size. Pericardium: There is no evidence of pericardial effusion. Mitral Valve: The mitral valve is normal in structure. No evidence of mitral valve regurgitation. No evidence of mitral valve stenosis. MV peak gradient, 3.0 mmHg. The mean mitral valve gradient is 2.0 mmHg. Tricuspid Valve: The tricuspid valve is normal in structure. Tricuspid valve regurgitation is not demonstrated. No evidence of tricuspid stenosis. Aortic Valve: The aortic valve was not well visualized. Aortic valve  regurgitation is not visualized. Mild aortic stenosis is present. Aortic valve mean gradient measures 9.0 mmHg. Aortic valve peak gradient measures 16.6 mmHg. Aortic valve area, by VTI measures 1.71 cm. Pulmonic Valve: The pulmonic valve was normal in structure. Pulmonic valve regurgitation is not visualized. No evidence of pulmonic stenosis. Aorta: The aortic root is normal in size and structure. Venous: The inferior vena cava is normal in size with greater than 50% respiratory variability, suggesting right atrial pressure of 3 mmHg. IAS/Shunts: No atrial level shunt detected by color flow Doppler.  LEFT VENTRICLE PLAX 2D LVIDd:         3.70 cm  Diastology LVIDs:         2.40 cm  LV e' medial:    7.62 cm/s LV PW:         0.90 cm  LV E/e' medial:  12.3 LV IVS:        0.70 cm  LV e' lateral:   8.38 cm/s LVOT diam:     2.00 cm  LV E/e' lateral: 11.2 LV SV:         53 LV SV Index:   31 LVOT Area:     3.14 cm  RIGHT VENTRICLE RV Basal diam:  2.30 cm LEFT ATRIUM             Index       RIGHT ATRIUM          Index LA diam:        3.80 cm 2.21 cm/m  RA Area:     5.89 cm LA Vol (A2C):   17.9 ml 10.42 ml/m RA Volume:   8.62 ml  5.02 ml/m LA Vol (A4C):   30.7 ml 17.86 ml/m LA Biplane  Vol: 24.2 ml 14.08 ml/m  AORTIC VALVE                    PULMONIC VALVE AV Area (Vmax):    1.43 cm     PV Vmax:       0.69 m/s AV Area (Vmean):   1.53 cm     PV Vmean:      37.800 cm/s AV Area (VTI):     1.71 cm     PV VTI:        0.078 m AV Vmax:           204.00 cm/s  PV Peak grad:  1.9 mmHg AV Vmean:          139.000 cm/s PV Mean grad:  1.0 mmHg AV VTI:            0.308 m AV Peak Grad:      16.6 mmHg AV Mean Grad:      9.0 mmHg LVOT Vmax:         92.70 cm/s LVOT Vmean:        67.600 cm/s LVOT VTI:          0.168 m LVOT/AV VTI ratio: 0.55  AORTA Ao Root diam: 3.00 cm MITRAL VALVE MV Area (PHT): 4.29 cm    SHUNTS MV Area VTI:   3.00 cm    Systemic VTI:  0.17 m MV Peak grad:  3.0 mmHg    Systemic Diam: 2.00 cm MV Mean grad:  2.0 mmHg MV Vmax:       0.86 m/s MV Vmean:      65.7 cm/s MV Decel Time: 177 msec MV E velocity: 93.80 cm/s MV A velocity: 79.70 cm/s MV E/A ratio:  1.18 Lorine Bears MD Electronically signed by Lorine Bears MD Signature Date/Time: 11/22/2020/1:53:54 PM    Final         Scheduled Meds:  budesonide (PULMICORT) nebulizer solution  0.25 mg Nebulization BID   Chlorhexidine Gluconate Cloth  6 each Topical Daily   feeding supplement  237 mL Oral BID BM    morphine injection  1 mg Intravenous Once   pantoprazole (PROTONIX) IV  40 mg Intravenous Q24H   Continuous Infusions:  sodium chloride 250 mL (11/17/20 1235)   cefTAZidime (FORTAZ)  IV     dextrose 5 % with KCl 20 mEq / L 20 mEq (11/22/20 0440)   folic acid (FOLVITE) IVPB 1 mg (11/22/20 1528)   sodium chloride Stopped (11/19/20 1107)   thiamine injection 250 mg (11/21/20 2344)     LOS: 8 days    Time spent: 35 mins    Erick Blinks, MD [facility]   If 7PM-7AM, please contact night-coverage [email or web address]  11/22/2020, 8:16 PM

## 2020-11-22 NOTE — Consult Note (Signed)
NAME: Dustin Vazquez  DOB: 10/22/1955  MRN: [id number]  Date/Time: 11/22/2020 5:43 PM  REQUESTING PROVIDER: Dr.memon Subjective:  REASON FOR CONSULT: pseudomonas bacteremia ?No history from patient- chart reviewed Dustin Vazquez is a 65 y.o. with a history of alcohol dependence, COPD, was brought in by EMS with altered mental status and confusion.  EMS was called to a place where they found him lying on the side and the ground with just his jeans and socks.  A person living next door told them that he was an alcoholic and did not know whether he was drinking that day.  Vitals done by EMS was 148/126, pulse rate of 90, sats of 97% and respiratory rate of 20.  Patient was awake and confused but was able to acknowledge his name and date of birth. In the ED blood pressure was 70-80 systolic, temperature 94 and labs revealed potassium of 3.1, creatinine 2.07, lipase 141, AST 89, total bili 2.5, lactate 3.7, WBC 13.4.  Alcohol level was less than 10.  He was started on IV ceftriaxone and was given fluids with some improvement in BP.  But subsequently became hypotensive needing additional IV fluids and resuscitation and as he needed Levophed he was transferred to the ICU.  Blood cultures were sent and Foley catheter was placed. On 11/16/2020 patient was started on steroids, duo nebs, Augmentin and Z-Pak and Librium for DTs and he was transferred to the hospitalist service. Blood culture was no growth and urine culture only had Staph epidermidis which was not deemed pathogen.  The hospital started him on high-dose of thiamine for possible Wernicke's encephalopathy.  MRI brain without contrast and EEG were done and was normal. Ultrasound abdomen showed a cirrhotic liver.  Patient pulled the catheter out on 9/13.  Went into urinary retention and a bladder scan done on 11/17/2020 showed more than 760 mL of urine.  A Foley catheter could not be advanced beyond the prostate with return of 20 to 30 cc of frank  blood.  T urology placed a daily catheter on 11/17/2020 at 9 PM.  On 11/18/2020 patient had a temperature of 100.2.  And his white count had gone up to 18.2.  So a blood culture was sent sent on 11/20/2018 became positive for Pseudomonas.  He was started on cefepime.  I am seeing the patient for the same. As patient continues to be confused cefepime has been changed to ceftazidime.  Past Medical History:  Diagnosis Date   COPD (chronic obstructive pulmonary disease) (HCC)     History reviewed. No pertinent surgical history.  Social History   Socioeconomic History   Marital status: Single    Spouse name: Not on file   Number of children: Not on file   Years of education: Not on file   Highest education level: Not on file  Occupational History   Not on file  Tobacco Use   Smoking status: Never   Smokeless tobacco: Never  Substance and Sexual Activity   Alcohol use: Yes    Comment: ocasionally   Drug use: Not on file   Sexual activity: Not on file  Other Topics Concern   Not on file  Social History Narrative   Not on file   Social Determinants of Health   Financial Resource Strain: Not on file  Food Insecurity: Not on file  Transportation Needs: Not on file  Physical Activity: Not on file  Stress: Not on file  Social Connections: Not on file  Intimate Partner  Violence: Not on file    History reviewed. No pertinent family history. Allergies  Allergen Reactions   Trazodone Anxiety   I? Current Facility-Administered Medications  Medication Dose Route Frequency Provider Last Rate Last Admin   0.9 %  sodium chloride infusion  250 mL Intravenous Continuous Mansy, Jan A, MD 10 mL/hr at 11/17/20 1235 250 mL at 11/17/20 1235   acetaminophen (TYLENOL) tablet 650 mg  650 mg Oral Q6H PRN Mansy, Jan A, MD   650 mg at 11/17/20 2585   Or   acetaminophen (TYLENOL) suppository 650 mg  650 mg Rectal Q6H PRN Mansy, Jan A, MD       budesonide (PULMICORT) nebulizer solution 0.25 mg  0.25 mg  Nebulization BID Belia Heman, Kurian, MD   0.25 mg at 11/22/20 0744   chlordiazePOXIDE (LIBRIUM) capsule 25 mg  25 mg Oral TID PRN Erin Fulling, MD   25 mg at 11/17/20 0807   Chlorhexidine Gluconate Cloth 2 % PADS 6 each  6 each Topical Daily Erin Fulling, MD   6 each at 11/21/20 1140   dextrose 5 % with KCl 20 mEq / L  infusion  20 mEq Intravenous Continuous Erick Blinks, MD 100 mL/hr at 11/22/20 0440 20 mEq at 11/22/20 0440   feeding supplement (ENSURE ENLIVE / ENSURE PLUS) liquid 237 mL  237 mL Oral BID BM Erin Fulling, MD   237 mL at 11/16/20 0905   folic acid 1 mg in sodium chloride 0.9 % 50 mL IVPB  1 mg Intravenous Daily Erick Blinks, MD 100.4 mL/hr at 11/22/20 1528 1 mg at 11/22/20 1528   ipratropium-albuterol (DUONEB) 0.5-2.5 (3) MG/3ML nebulizer solution 3 mL  3 mL Nebulization Q6H PRN Jimmye Norman, NP   3 mL at 11/16/20 0541   magnesium hydroxide (MILK OF MAGNESIA) suspension 30 mL  30 mL Oral Daily PRN Mansy, Jan A, MD       morphine 2 MG/ML injection 1 mg  1 mg Intravenous Once Erick Blinks, MD       ondansetron [facility]) tablet 4 mg  4 mg Oral Q6H PRN Mansy, Jan A, MD       Or   ondansetron [facility]) injection 4 mg  4 mg Intravenous Q6H PRN Mansy, Jan A, MD       pantoprazole (PROTONIX) injection 40 mg  40 mg Intravenous Q24H Erick Blinks, MD   40 mg at 11/21/20 2150   sodium chloride 0.9 % bolus 500 mL  500 mL Intravenous Once Erick Blinks, MD   Held at 11/19/20 1107   thiamine (B-1) 250 mg in sodium chloride 0.9 % 50 mL IVPB  250 mg Intravenous Q24H Erick Blinks, MD 100 mL/hr at 11/21/20 2344 250 mg at 11/21/20 2344     Abtx:  Anti-infectives (From admission, onward)    Start     Dose/Rate Route Frequency Ordered Stop   11/20/20 0630  ceFEPIme (MAXIPIME) 2 g in sodium chloride 0.9 % 100 mL IVPB  Status:  Discontinued        2 g 200 mL/hr over 30 Minutes Intravenous Every 8 hours 11/20/20 0530 11/22/20 1730   11/19/20 2200  vancomycin (VANCOREADY) IVPB 750  mg/150 mL  Status:  Discontinued        750 mg 150 mL/hr over 60 Minutes Intravenous Every 12 hours 11/19/20 1202 11/20/20 0529   11/19/20 1100  vancomycin (VANCOREADY) IVPB 1500 mg/300 mL        1,500 mg 150 mL/hr over 120 Minutes Intravenous  Once 11/19/20 1051 11/19/20 1412   11/17/20 1900  Ampicillin-Sulbactam (UNASYN) 3 g in sodium chloride 0.9 % 100 mL IVPB  Status:  Discontinued        3 g 200 mL/hr over 30 Minutes Intravenous Every 6 hours 11/17/20 1751 11/20/20 0529   11/17/20 1830  azithromycin (ZITHROMAX) 500 mg in sodium chloride 0.9 % 250 mL IVPB  Status:  Discontinued        500 mg 250 mL/hr over 60 Minutes Intravenous Every 24 hours 11/17/20 1737 11/20/20 0529   11/17/20 1000  azithromycin (ZITHROMAX) tablet 250 mg  Status:  Discontinued       See Hyperspace for full Linked Orders Report.   250 mg Oral Daily 11/16/20 1104 11/17/20 1737   11/16/20 2200  amoxicillin-clavulanate (AUGMENTIN) 875-125 MG per tablet 1 tablet  Status:  Discontinued        1 tablet Oral Every 12 hours 11/16/20 1104 11/17/20 1737   11/16/20 1200  azithromycin (ZITHROMAX) tablet 500 mg       See Hyperspace for full Linked Orders Report.   500 mg Oral Daily 11/16/20 1104 11/16/20 1244   11/16/20 0000  cefTRIAXone (ROCEPHIN) 2 g in sodium chloride 0.9 % 100 mL IVPB  Status:  Discontinued        2 g 200 mL/hr over 30 Minutes Intravenous Every 24 hours 11/15/20 0058 11/15/20 0058   11/15/20 2300  cefTRIAXone (ROCEPHIN) 2 g in sodium chloride 0.9 % 100 mL IVPB  Status:  Discontinued        2 g 200 mL/hr over 30 Minutes Intravenous Every 24 hours 11/15/20 0058 11/16/20 1104   11/15/20 2200  cefTRIAXone (ROCEPHIN) 2 g in sodium chloride 0.9 % 100 mL IVPB  Status:  Discontinued        2 g 200 mL/hr over 30 Minutes Intravenous Every 24 hours 11/14/20 2204 11/15/20 0058   11/14/20 2230  cefTRIAXone (ROCEPHIN) 1 g in sodium chloride 0.9 % 100 mL IVPB        1 g 200 mL/hr over 30 Minutes Intravenous  Once  11/14/20 2219 11/15/20 0027   11/14/20 2115  cefTRIAXone (ROCEPHIN) 1 g in sodium chloride 0.9 % 100 mL IVPB        1 g 200 mL/hr over 30 Minutes Intravenous  Once 11/14/20 2101 11/14/20 2146       REVIEW OF SYSTEMS:  Not available Objective:  VITALS:  BP 112/72 (BP Location: Left Arm)   Pulse 78   Temp 98 F (36.7 C) (Axillary)   Resp 18   Ht  (1.676 m)   Wt 63.5 kg   SpO2 100%   BMI 22.60 kg/m  PHYSICAL EXAM:  General: Awake, cachectic, disheveled responds some questions like name, date of birth .  His speech is incomprehensible Eyes: Conjunctivae clear, anicteric sclerae. Pupils are equal ENT not be examined Neck: Supple, symmetrical, no adenopathy, thyroid: non tender no carotid bruit and no JVD. Back: No CVA tenderness. Lungs: Bilateral air entry. Heart: Regular rate and rhythm, no murmur, rub or gallop. Abdomen: Soft, non-tender,not distended. Bowel sounds normal. No masses Extremities: atraumatic, no cyanosis. No edema. No clubbing Skin: No rashes or lesions. Or bruising Lymph: Cervical, supraclavicular normal. Neurologic: Cannot be assessed.  Pertinent Labs Lab Results CBC    Component Value Date/Time   WBC 6.5 11/22/2020 0419   RBC 3.46 (L) 11/22/2020 0419   HGB 11.1 (L) 11/22/2020 0419   HGB 14.4 12/15/2012 0449   HCT 33.7 (  L) 11/22/2020 0419   HCT 42.3 12/15/2012 0449   PLT 77 (L) 11/22/2020 0419   PLT 333 12/15/2012 0449   MCV 97.4 11/22/2020 0419   MCV 91 12/15/2012 0449   MCH 32.1 11/22/2020 0419   MCHC 32.9 11/22/2020 0419   RDW 15.5 11/22/2020 0419   RDW 13.9 12/15/2012 0449   LYMPHSABS 1.4 11/17/2020 0641   LYMPHSABS 1.2 12/12/2012 0603   MONOABS 0.4 11/17/2020 0641   MONOABS 1.0 12/12/2012 0603   EOSABS 0.0 11/17/2020 0641   EOSABS 0.0 12/12/2012 0603   BASOSABS 0.0 11/17/2020 0641   BASOSABS 0.0 12/12/2012 0603    CMP Latest Ref Rng & Units 11/22/2020 11/21/2020 11/20/2020  Glucose 70 - 99 mg/dL 97 449(Q) 759(F)  BUN 8 - 23  mg/dL 10 11 13   Creatinine 0.61 - 1.24 mg/dL 6.38) 4.66(Z  Sodium 135 - 145 mmol/L 134(L) 135 142  Potassium 3.5 - 5.1 mmol/L 3.6 3.9 3.7  Chloride 98 - 111 mmol/L 108 109 113(H)  CO2 22 - 32 mmol/L 21(L) 22 23  Calcium 8.9 - 10.3 mg/dL 8.1(L) 7.7(L) 7.7(L)  Total Protein 6.5 - 8.1 g/dL 9.93) 5.7(S) -  Total Bilirubin 0.3 - 1.2 mg/dL 1.7(B) 9.3(J) -  Alkaline Phos 38 - 126 U/L 73 69 -  AST 15 - 41 U/L 52(H) 67(H) -  ALT 0 - 44 U/L 30 36 -      Microbiology: Recent Results (from the past 240 hour(s))  Blood culture (routine single)     Status: None   Collection Time: 11/14/20  8:08 PM   Specimen: BLOOD  Result Value Ref Range Status   Specimen Description BLOOD RIGHT ANTECUBITAL  Final   Special Requests   Final    BOTTLES DRAWN AEROBIC AND ANAEROBIC Blood Culture adequate volume   Culture   Final    NO GROWTH 5 DAYS Performed at [facility], [address] [place], [place]    Report Status 11/19/2020 FINAL  Final  Urine Culture     Status: Abnormal   Collection Time: 11/14/20  8:08 PM   Specimen: Urine, Random  Result Value Ref Range Status   Specimen Description   Final    URINE, RANDOM Performed at [facility], [address] [place], [place]    Special Requests   Final    NONE Performed at [facility], [address] [place], [place]    Culture >=100,000 COLONIES/mL STAPHYLOCOCCUS EPIDERMIDIS (A)  Final   Report Status 11/17/2020 FINAL  Final   Organism ID, Bacteria STAPHYLOCOCCUS EPIDERMIDIS (A)  Final      Susceptibility   Staphylococcus epidermidis - MIC*    CIPROFLOXACIN 2 INTERMEDIATE Intermediate     GENTAMICIN <=0.5 SENSITIVE Sensitive     NITROFURANTOIN <=16 SENSITIVE Sensitive     OXACILLIN <=0.25 SENSITIVE Sensitive     TETRACYCLINE <=1 SENSITIVE Sensitive     VANCOMYCIN 2 SENSITIVE Sensitive     TRIMETH/SULFA 80 RESISTANT Resistant     CLINDAMYCIN <=0.25 SENSITIVE Sensitive      RIFAMPIN <=0.5 SENSITIVE Sensitive     Inducible Clindamycin NEGATIVE Sensitive     * >=100,000 COLONIES/mL STAPHYLOCOCCUS EPIDERMIDIS  Resp Panel by RT-PCR (Flu A&B, Covid) Nasopharyngeal Swab     Status: None   Collection Time: 11/14/20  9:45 PM   Specimen: Nasopharyngeal Swab; Nasopharyngeal(NP) swabs in vial transport medium  Result Value Ref Range Status   SARS Coronavirus 2 by RT PCR NEGATIVE NEGATIVE  Final    Comment: (NOTE) SARS-CoV-2 target nucleic acids are NOT DETECTED.  The SARS-CoV-2 RNA is generally detectable in upper respiratory specimens during the acute phase of infection. The lowest concentration of SARS-CoV-2 viral copies this assay can detect is 138 copies/mL. A negative result does not preclude SARS-Cov-2 infection and should not be used as the sole basis for treatment or other patient management decisions. A negative result may occur with  improper specimen collection/handling, submission of specimen other than nasopharyngeal swab, presence of viral mutation(s) within the areas targeted by this assay, and inadequate number of viral copies(<138 copies/mL). A negative result must be combined with clinical observations, patient history, and epidemiological information. The expected result is Negative.  Fact Sheet for Patients:  BloggerCourse.com  Fact Sheet for Healthcare Providers:  SeriousBroker.it  This test is no t yet approved or cleared by the [place] FDA and  has been authorized for detection and/or diagnosis of SARS-CoV-2 by FDA under an Emergency Use Authorization (EUA). This EUA will remain  in effect (meaning this test can be used) for the duration of the COVID-19 declaration under Section 564(b)(1) of the Act, 21 U.S.C.section 360bbb-3(b)(1), unless the authorization is terminated  or revoked sooner.       Influenza A by PCR NEGATIVE NEGATIVE Final   Influenza B by PCR NEGATIVE NEGATIVE  Final    Comment: (NOTE) The Xpert Xpress SARS-CoV-2/FLU/RSV plus assay is intended as an aid in the diagnosis of influenza from Nasopharyngeal swab specimens and should not be used as a sole basis for treatment. Nasal washings and aspirates are unacceptable for Xpert Xpress SARS-CoV-2/FLU/RSV testing.  Fact Sheet for Patients: BloggerCourse.com  Fact Sheet for Healthcare Providers: SeriousBroker.it  This test is not yet approved or cleared by the [place] FDA and has been authorized for detection and/or diagnosis of SARS-CoV-2 by FDA under an Emergency Use Authorization (EUA). This EUA will remain in effect (meaning this test can be used) for the duration of the COVID-19 declaration under Section 564(b)(1) of the Act, 21 U.S.C. section 360bbb-3(b)(1), unless the authorization is terminated or revoked.  Performed at [facility], [address] [place], [place] [address]   Culture, blood (single) w Reflex to ID Panel     Status: None   Collection Time: 11/15/20  1:50 AM   Specimen: BLOOD  Result Value Ref Range Status   Specimen Description BLOOD BLOOD RIGHT HAND  Final   Special Requests   Final    BOTTLES DRAWN AEROBIC AND ANAEROBIC Blood Culture adequate volume   Culture   Final    NO GROWTH 5 DAYS Performed at [facility], [address] [place], [place] [address]    Report Status 11/20/2020 FINAL  Final  MRSA Next Gen by PCR, Nasal     Status: None   Collection Time: 11/15/20  1:54 AM   Specimen: Nasal Mucosa; Nasal Swab  Result Value Ref Range Status   MRSA by PCR Next Gen NOT DETECTED NOT DETECTED Final    Comment: (NOTE) The GeneXpert MRSA Assay (FDA approved for NASAL specimens only), is one component of a comprehensive MRSA colonization surveillance program. It is not intended to diagnose MRSA infection nor to guide or monitor treatment for MRSA infections. Test performance is not FDA  approved in patients less than 44 years old. Performed at [facility], [address] [place], [place] [address]   CULTURE, BLOOD (ROUTINE X 2) w Reflex to ID Panel  Status: Abnormal   Collection Time: 11/19/20 10:35 AM   Specimen: BLOOD  Result Value Ref Range Status   Specimen Description   Final    BLOOD BLOOD LEFT HAND Performed at [facility], [address], [place], [place] [address]    Special Requests   Final    BOTTLES DRAWN AEROBIC AND ANAEROBIC Blood Culture adequate volume Performed at [facility], [address] [place], [place] [address]    Culture  Setup Time   Final    GRAM NEGATIVE RODS AEROBIC BOTTLE ONLY CRITICAL RESULT CALLED TO, READ BACK BY AND VERIFIED WITH: JASON ROBBINS AT 0500 11/20/20.PMF Performed at [facility], [address], [place], [place] [address]    Culture PSEUDOMONAS AERUGINOSA (A)  Final   Report Status 11/22/2020 FINAL  Final   Organism ID, Bacteria PSEUDOMONAS AERUGINOSA  Final      Susceptibility   Pseudomonas aeruginosa - MIC*    CEFTAZIDIME 4 SENSITIVE Sensitive     CIPROFLOXACIN <=0.25 SENSITIVE Sensitive     GENTAMICIN <=1 SENSITIVE Sensitive     IMIPENEM 2 SENSITIVE Sensitive     PIP/TAZO 8 SENSITIVE Sensitive     CEFEPIME 2 SENSITIVE Sensitive     * PSEUDOMONAS AERUGINOSA  Blood Culture ID Panel (Reflexed)     Status: Abnormal   Collection Time: 11/19/20 10:35 AM  Result Value Ref Range Status   Enterococcus faecalis NOT DETECTED NOT DETECTED Final   Enterococcus Faecium NOT DETECTED NOT DETECTED Final   Listeria monocytogenes NOT DETECTED NOT DETECTED Final   Staphylococcus species NOT DETECTED NOT DETECTED Final   Staphylococcus aureus (BCID) NOT DETECTED NOT DETECTED Final   Staphylococcus epidermidis NOT DETECTED NOT DETECTED Final   Staphylococcus lugdunensis NOT DETECTED NOT DETECTED Final   Streptococcus species NOT DETECTED NOT DETECTED Final   Streptococcus agalactiae  NOT DETECTED NOT DETECTED Final   Streptococcus pneumoniae NOT DETECTED NOT DETECTED Final   Streptococcus pyogenes NOT DETECTED NOT DETECTED Final   A.calcoaceticus-baumannii NOT DETECTED NOT DETECTED Final   Bacteroides fragilis NOT DETECTED NOT DETECTED Final   Enterobacterales NOT DETECTED NOT DETECTED Final   Enterobacter cloacae complex NOT DETECTED NOT DETECTED Final   Escherichia coli NOT DETECTED NOT DETECTED Final   Klebsiella aerogenes NOT DETECTED NOT DETECTED Final   Klebsiella oxytoca NOT DETECTED NOT DETECTED Final   Klebsiella pneumoniae NOT DETECTED NOT DETECTED Final   Proteus species NOT DETECTED NOT DETECTED Final   Salmonella species NOT DETECTED NOT DETECTED Final   Serratia marcescens NOT DETECTED NOT DETECTED Final   Haemophilus influenzae NOT DETECTED NOT DETECTED Final   Neisseria meningitidis NOT DETECTED NOT DETECTED Final   Pseudomonas aeruginosa DETECTED (A) NOT DETECTED Final    Comment: CRITICAL RESULT CALLED TO, READ BACK BY AND VERIFIED WITH: JASON ROBBINS AT 0500 11/20/20.PMF    Stenotrophomonas maltophilia NOT DETECTED NOT DETECTED Final   Candida albicans NOT DETECTED NOT DETECTED Final   Candida auris NOT DETECTED NOT DETECTED Final   Candida glabrata NOT DETECTED NOT DETECTED Final   Candida krusei NOT DETECTED NOT DETECTED Final   Candida parapsilosis NOT DETECTED NOT DETECTED Final   Candida tropicalis NOT DETECTED NOT DETECTED Final   Cryptococcus neoformans/gattii NOT DETECTED NOT DETECTED Final   CTX-M ESBL NOT DETECTED NOT DETECTED Final   Carbapenem resistance IMP NOT DETECTED NOT DETECTED Final   Carbapenem resistance KPC NOT DETECTED NOT DETECTED Final   Carbapenem resistance NDM NOT DETECTED NOT DETECTED Final  Carbapenem resistance VIM NOT DETECTED NOT DETECTED Final    Comment: Performed at [facility], [address] [place], [place] [address]  CULTURE, BLOOD (ROUTINE X 2) w Reflex to ID Panel     Status: None  (Preliminary result)   Collection Time: 11/19/20 10:40 AM   Specimen: BLOOD  Result Value Ref Range Status   Specimen Description BLOOD BLOOD RIGHT WRIST  Final   Special Requests   Final    BOTTLES DRAWN AEROBIC AND ANAEROBIC Blood Culture adequate volume   Culture   Final    NO GROWTH 3 DAYS Performed at [facility], [address] [place], [place] [address]    Report Status PENDING  Incomplete    IMAGING RESULTS:  I have personally reviewed the films ? Impression/Recommendation Altered sensorium and was found on the ground outside.  Was hypothermic hypotensive and septic on admission.  Initially thought to have urinary tract infection was treated with antibiotics.  But urine culture only had staff epidermidis. Pulled his Foley catheter out and traumatized his urethra.  Then developed a fever. Pseudomonas bacteremia Likely due to urethral trauma. Was on cefepime which has been changed to ceftazidime because of persisting altered mental status. Fever and leukocytosis have resolved  Altered mental status  possible Wernicke's encephalopathy versus alcohol withdrawal seizures. Doubt this is meningitis or encephalitis. Will get an RPR Recommend neuro consult.    COPD   Alcohol abuse with cirrhosis liver Ammonia level normal.  ___________________________________________________ Discussed with nurse and the requesting provider.  Note:  This document was prepared using Dragon voice recognition software and may include unintentional dictation errors.

## 2020-11-23 DIAGNOSIS — R6521 Severe sepsis with septic shock: Secondary | ICD-10-CM | POA: Diagnosis not present

## 2020-11-23 DIAGNOSIS — R4182 Altered mental status, unspecified: Secondary | ICD-10-CM

## 2020-11-23 DIAGNOSIS — N139 Obstructive and reflux uropathy, unspecified: Secondary | ICD-10-CM | POA: Diagnosis not present

## 2020-11-23 DIAGNOSIS — E43 Unspecified severe protein-calorie malnutrition: Secondary | ICD-10-CM | POA: Diagnosis not present

## 2020-11-23 DIAGNOSIS — K7030 Alcoholic cirrhosis of liver without ascites: Secondary | ICD-10-CM | POA: Diagnosis not present

## 2020-11-23 LAB — GLUCOSE, CAPILLARY
Glucose-Capillary: 104 mg/dL — ABNORMAL HIGH (ref 70–99)
Glucose-Capillary: 105 mg/dL — ABNORMAL HIGH (ref 70–99)
Glucose-Capillary: 106 mg/dL — ABNORMAL HIGH (ref 70–99)
Glucose-Capillary: 77 mg/dL (ref 70–99)
Glucose-Capillary: 89 mg/dL (ref 70–99)

## 2020-11-23 LAB — RENAL FUNCTION PANEL
Albumin: 2.1 g/dL — ABNORMAL LOW (ref 3.5–5.0)
Anion gap: 4 — ABNORMAL LOW (ref 5–15)
BUN: 9 mg/dL (ref 8–23)
CO2: 21 mmol/L — ABNORMAL LOW (ref 22–32)
Calcium: 8 mg/dL — ABNORMAL LOW (ref 8.9–10.3)
Chloride: 107 mmol/L (ref 98–111)
Creatinine, Ser: 0.51 mg/dL — ABNORMAL LOW (ref 0.61–1.24)
GFR, Estimated: >60 mL/min (ref >60)
Glucose, Bld: 104 mg/dL — ABNORMAL HIGH (ref 70–99)
Phosphorus: 2.3 mg/dL — ABNORMAL LOW (ref 2.5–4.6)
Potassium: 3.8 mmol/L (ref 3.5–5.1)
Sodium: 132 mmol/L — ABNORMAL LOW (ref 135–145)

## 2020-11-23 LAB — CBC
HCT: 33.9 % — ABNORMAL LOW (ref 39.0–52.0)
Hemoglobin: 11.4 g/dL — ABNORMAL LOW (ref 13.0–17.0)
MCH: 33 pg (ref 26.0–34.0)
MCHC: 33.6 g/dL (ref 30.0–36.0)
MCV: 98.3 fL (ref 80.0–100.0)
Platelets: 86 10*3/uL — ABNORMAL LOW (ref 150–400)
RBC: 3.45 MIL/uL — ABNORMAL LOW (ref 4.22–5.81)
RDW: 15 % (ref 11.5–15.5)
WBC: 7.9 10*3/uL (ref 4.0–10.5)
nRBC: 0 % (ref 0.0–0.2)

## 2020-11-23 LAB — VITAMIN B12: Vitamin B-12: 1724 pg/mL — ABNORMAL HIGH (ref 180–914)

## 2020-11-23 MED ORDER — POTASSIUM PHOSPHATES 15 MMOLE/5ML IV SOLN
15.0000 mmol | Freq: Once | INTRAVENOUS | Status: AC
  Administered 2020-11-23: 15 mmol via INTRAVENOUS
  Filled 2020-11-23: qty 5

## 2020-11-23 NOTE — Consult Note (Signed)
NEURO HOSPITALIST CONSULT NOTE   Requestig physician: Dr. Kerry Hough  Reason for Consult: Persistent encephalopathy  History obtained from:  Chart     HPI:                                                                                                                                          Dustin Vazquez is an 65 y.o. male with a PMHx of COPD and liver dysfunction / possible cirrhosis suspected secondary to alcohol abuse, who was admitted on 9/11 with sepsis and metabolic derangements after being found down outside. Despite antibiotic treatment and correction of electrolytes, he has continued to be encephalopathic.   History documented in Hospitalist note from today has been reviewed: "65 year old male is brought to the hospital after he was found on the side of the road with altered mental status.  Noted to be hypotensive, hypothermic on arrival with elevated creatinine, liver enzymes and lactic acid.  Noted to have urinary tract infection and was admitted to the hospital for septic shock requiring vasopressors.  He was admitted to the ICU for further management.  Overall hemodynamics improved with IV fluids. Transferred to [facility] on 9/14. Found to have 1/4 blood cultures positive for pseudomonas. He has been on IV antibiotics. He has had prolonged hospital course and remains encephalopathic. Neurology consulted for further input. Palliative care following for goals of care."  EEG 9/19: This EEG was obtained while sedated (post ativan administration) with rare sleep architecture and was abnormal due to moderate diffuse slowing indicative of global cerebral dysfunction.   MRI brain:  Age advanced generalized atrophy (moderate to severe). No acute abnormality.    Past Medical History:  Diagnosis Date   COPD (chronic obstructive pulmonary disease) (HCC)     History reviewed. No pertinent surgical history.  History reviewed. No pertinent family history.          Social  History:  reports that he has never smoked. He has never used smokeless tobacco. He reports current alcohol use. No history on file for drug use.  Allergies  Allergen Reactions   Trazodone Anxiety    MEDICATIONS:  Prior to Admission:  No medications prior to admission.   Scheduled:  budesonide (PULMICORT) nebulizer solution  0.25 mg Nebulization BID   Chlorhexidine Gluconate Cloth  6 each Topical Daily   feeding supplement  237 mL Oral BID BM    morphine injection  1 mg Intravenous Once   pantoprazole (PROTONIX) IV  40 mg Intravenous Q24H   Continuous:  sodium chloride 250 mL (11/17/20 1235)   cefTAZidime (FORTAZ)  IV 2 g (11/23/20 1340)   dextrose 5 % with KCl 20 mEq / L 20 mEq (11/22/20 0440)   folic acid (FOLVITE) IVPB Stopped (11/23/20 1103)   sodium chloride Stopped (11/19/20 1107)   thiamine injection Stopped (11/22/20 2230)     ROS:                                                                                                                                       Unable to obtain due to abulia.    Blood pressure (!) 93/56, pulse 85, temperature 98.7 F (37.1 C), resp. rate 17, height 5\' 6"  (1.676 m), weight 59.2 kg, SpO2 96 %.   General Examination:                                                                                                       Physical Exam  HEENT-  [place]/AT   Lungs- Mild upper airway sounds grossly audible. Respirations unlabored.  Extremities- No edema Skin: With subtle yellowish hue  Neurological Examination Mental Status: Awake. Abulic with decreased spontaneous movement and no spontaneous speech. Speech is dysarthric. Will answer some questions with one-word answers that are often unintelligible. Oriented to "hospital", [place] and . Unable to state the year or month. Increased latencies of verbal responses. Not following  motor commands except for tracking of examiner visually when asked, sticking out tongue, and following request for flexing of arms on motor exam.  Cranial Nerves: II: Briefly tracks examiner visually.  III,IV, VI: Saccadic pursuits to right and left are intact.  V,VII: Does not answer questions when testing sensation. Face is symmetric.  VIII: Unable to formally assess IX,X: Speech is hypophonic XI: Head is midline XII: Midline tongue extension Motor/Sensory: Does not cooperated with motor exam except for flexion of BUE at elbows, rated as 4/5. Reacts to touch BUE.  Weakly withdraws BLE and exclaims softly with noxious plantar stimulation Deep Tendon Reflexes: Unremarkable. Toes are equivocal.  Cerebellar/Gait: Unable to assess    Lab Results: Basic Metabolic Panel: Recent  Labs  Lab 11/17/20 0641 11/18/20 0254 11/19/20 0623 11/20/20 0434 11/21/20 0412 11/22/20 0419 11/23/20 0330  NA 145 147* 148* 142 135 134* 132*  K 3.9 3.5 3.4* 3.7 3.9 3.6 3.8  CL 113* 116* 119* 113* 109 108 107  CO2 26 24 23 23 22  21* 21*  GLUCOSE 127* 98 89 108* 104* 97 104*  BUN 18 14 13 13 11 10 9   CREATININE 0.66 0.57* 0.91 0.77  0.72 0.57* 0.64 0.51*  CALCIUM 8.4* 8.3* 8.4* 7.7* 7.7* 8.1* 8.0*  MG 2.0  --   --   --   --   --   --   PHOS 2.8 2.6 1.7* 3.2  --   --  2.3*    CBC: Recent Labs  Lab 11/17/20 0641 11/17/20 1554 11/19/20 0623 11/20/20 0434 11/21/20 0412 11/22/20 0419 11/23/20 0330  WBC 5.7   < > 18.2* 5.4 7.0 6.5 7.9  NEUTROABS 3.8  --   --   --   --   --   --   HGB 10.5*   < > 10.2* 9.0* 11.1* 11.1* 11.4*  HCT 32.1*   < > 31.0* 27.3* 32.7* 33.7* 33.9*  MCV 101.6*   < > 104.7* 102.2* 98.2 97.4 98.3  PLT 114*   < > 121* 93* 76* 77* 86*   < > = values in this interval not displayed.    Cardiac Enzymes: No results for input(s): CKTOTAL, CKMB, CKMBINDEX, TROPONINI in the last 168 hours.  Lipid Panel: No results for input(s): CHOL, TRIG, HDL, CHOLHDL, VLDL, LDLCALC in the last  11/24/20 hours.  Imaging: ECHOCARDIOGRAM COMPLETE  Result Date: 11/22/2020    ECHOCARDIOGRAM REPORT   Patient Name:   ADDIEL MCCARDLE Date of Exam: 11/22/2020 Medical Rec #:  Geronimo Boot         Height:       66.0 in Accession #:    11/24/[id number]        Weight:       140.0 lb Date of Birth:  [birth year]         BSA:          1.719 m Patient Age:    65 years          BP:           101/68 mmHg Patient Gender: M                 HR:           94 bpm. Exam Location:  [facility] Procedure: 2D Echo, Color Doppler and Cardiac Doppler Indications:     I50.9 Congestive Heart Failure  History:         Patient has no prior history of Echocardiogram examinations.                  COPD; Signs/Symptoms:Altered mental status.  Sonographer:     [id number] Referring Phys:  04/05/1955 [facility] Diagnosing Phys: [id number] MD  Sonographer Comments: Suboptimal parasternal window. Image acquisition challenging due to uncooperative patient and Image acquisition challenging due to COPD. IMPRESSIONS  1. Left ventricular ejection fraction, by estimation, is 55 to 60%. The left ventricle has normal function. The left ventricle has no regional wall motion abnormalities. Left ventricular diastolic parameters were normal.  2. Right ventricular systolic function is normal. The right ventricular size is normal. Tricuspid regurgitation signal is inadequate for assessing PA pressure.  3. The mitral valve is normal in structure. No evidence of  mitral valve regurgitation. No evidence of mitral stenosis.  4. The aortic valve was not well visualized. Aortic valve regurgitation is not visualized. Mild aortic valve stenosis. Aortic valve area, by VTI measures 1.71 cm. Aortic valve mean gradient measures 9.0 mmHg.  5. The inferior vena cava is normal in size with greater than 50% respiratory variability, suggesting right atrial pressure of 3 mmHg. FINDINGS  Left Ventricle: Left ventricular ejection fraction, by estimation, is 55 to 60%. The left ventricle has  normal function. The left ventricle has no regional wall motion abnormalities. The left ventricular internal cavity size was normal in size. There is  no left ventricular hypertrophy. Left ventricular diastolic parameters were normal. Right Ventricle: The right ventricular size is normal. No increase in right ventricular wall thickness. Right ventricular systolic function is normal. Tricuspid regurgitation signal is inadequate for assessing PA pressure. Left Atrium: Left atrial size was normal in size. Right Atrium: Right atrial size was normal in size. Pericardium: There is no evidence of pericardial effusion. Mitral Valve: The mitral valve is normal in structure. No evidence of mitral valve regurgitation. No evidence of mitral valve stenosis. MV peak gradient, 3.0 mmHg. The mean mitral valve gradient is 2.0 mmHg. Tricuspid Valve: The tricuspid valve is normal in structure. Tricuspid valve regurgitation is not demonstrated. No evidence of tricuspid stenosis. Aortic Valve: The aortic valve was not well visualized. Aortic valve regurgitation is not visualized. Mild aortic stenosis is present. Aortic valve mean gradient measures 9.0 mmHg. Aortic valve peak gradient measures 16.6 mmHg. Aortic valve area, by VTI measures 1.71 cm. Pulmonic Valve: The pulmonic valve was normal in structure. Pulmonic valve regurgitation is not visualized. No evidence of pulmonic stenosis. Aorta: The aortic root is normal in size and structure. Venous: The inferior vena cava is normal in size with greater than 50% respiratory variability, suggesting right atrial pressure of 3 mmHg. IAS/Shunts: No atrial level shunt detected by color flow Doppler.  LEFT VENTRICLE PLAX 2D LVIDd:         3.70 cm  Diastology LVIDs:         2.40 cm  LV e' medial:    7.62 cm/s LV PW:         0.90 cm  LV E/e' medial:  12.3 LV IVS:        0.70 cm  LV e' lateral:   8.38 cm/s LVOT diam:     2.00 cm  LV E/e' lateral: 11.2 LV SV:         53 LV SV Index:   31 LVOT  Area:     3.14 cm  RIGHT VENTRICLE RV Basal diam:  2.30 cm LEFT ATRIUM             Index       RIGHT ATRIUM          Index LA diam:        3.80 cm 2.21 cm/m  RA Area:     5.89 cm LA Vol (A2C):   17.9 ml 10.42 ml/m RA Volume:   8.62 ml  5.02 ml/m LA Vol (A4C):   30.7 ml 17.86 ml/m LA Biplane Vol: 24.2 ml 14.08 ml/m  AORTIC VALVE                    PULMONIC VALVE AV Area (Vmax):    1.43 cm     PV Vmax:       0.69 m/s AV Area (Vmean):   1.53 cm  PV Vmean:      37.800 cm/s AV Area (VTI):     1.71 cm     PV VTI:        0.078 m AV Vmax:           204.00 cm/s  PV Peak grad:  1.9 mmHg AV Vmean:          139.000 cm/s PV Mean grad:  1.0 mmHg AV VTI:            0.308 m AV Peak Grad:      16.6 mmHg AV Mean Grad:      9.0 mmHg LVOT Vmax:         92.70 cm/s LVOT Vmean:        67.600 cm/s LVOT VTI:          0.168 m LVOT/AV VTI ratio: 0.55  AORTA Ao Root diam: 3.00 cm MITRAL VALVE MV Area (PHT): 4.29 cm    SHUNTS MV Area VTI:   3.00 cm    Systemic VTI:  0.17 m MV Peak grad:  3.0 mmHg    Systemic Diam: 2.00 cm MV Mean grad:  2.0 mmHg MV Vmax:       0.86 m/s MV Vmean:      65.7 cm/s MV Decel Time: 177 msec MV E velocity: 93.80 cm/s MV A velocity: 79.70 cm/s MV E/A ratio:  1.18 Lorine Bears MD Electronically signed by Lorine Bears MD Signature Date/Time: 11/22/2020/1:53:54 PM    Final      Assessment: 65 year old male admitted on 9/11 with sepsis and metabolic derangements after being found down outside. Despite antibiotic treatment and correction of electrolytes he has continued to be encephalopathic.  1. Exam reveals and abulic patient with decreased speech output and little responses to prompts when attempting motor exam. No facial droop, eye deviation or asymmetry noted. No jerking, twitching or posturing to suggest seizure activity.  2. EEG 9/19: This EEG was obtained while sedated (post ativan administration) with rare sleep architecture and was abnormal due to moderate diffuse slowing indicative of global  cerebral dysfunction.  3. MRI brain: Age advanced generalized atrophy (moderate to severe). No acute abnormality.  4. B1 and B12 levels are elevated, most likely due to supplementation. TSH was normal.  5. T. Bili elevated. Skin with slight yellowish hue. AST mildly elevated. ALT normal. Decreased total protein. Ammonia normal currently, but was elevated on admission. A recent prothrombin time was elevated. He does have a history of alcoholic cirrhosis. DDx for the patient's AMS includes hepatic encephalopathy. Hypoactive delirium also on the DDx. Does not appear consistent with catatonia.    Recommendations: 1. Continue to monitor and manage as you are doing.  2. PT/OT/Speech 3. Serial assessments as you are doing to assess for possible gradual improvement over time.  4. OOB to chair during the day, if possible.  5. Repeat EEG on Friday to assess for possible improvement (ordered).  6. Neurohospitalist service will follow PRN. Please call us after EEG result is in.   Electronically signed: Dr. Caryl Pina 11/23/2020, 1:15 PM

## 2020-11-23 NOTE — Progress Notes (Addendum)
Pharmacy Antibiotic Note  Dustin Vazquez is a 65 y.o. male with PMH of COPD, tobacco abuse who was admitted on 11/14/2020 with sepsis. Pharmacy has been consulted for ceftazidime dosing.  Pt has been afebrile >48h, WBC 18.2>5.4>7.0>6.5.7.9  Scr 2.01>1.48>0.66>0.57>0.91>0.72>0.57>0.64>0.51  Pseudomonas growing in 1 of 4 bottles (aerobic) and was shown to be sensitive to ceftazidime and cefepime.   ID is consulted, recommendations pending   Plan: Ceftazidime   --Will continue ceftazidime 2 gm IV Q8H   Will continue to monitor renal function and adjust as clinically indicated  Height: 5\' 6"  (167.6 cm) Weight: 59.2 kg (130 lb 8.2 oz) IBW/kg (Calculated) : 63.8  Temp (24hrs), Avg:98.2 F (36.8 C), Min:97.8 F (36.6 C), Max:98.8 F (37.1 C)  Recent Labs  Lab 11/19/20 0623 11/20/20 0434 11/21/20 0412 11/22/20 0419 11/23/20 0330  WBC 18.2* 5.4 7.0 6.5 7.9  CREATININE 0.91 0.77  0.72 0.57* 0.64 0.51*     Estimated Creatinine Clearance: 76.1 mL/min (A) (by C-G formula based on SCr of 0.51 mg/dL (L)).    Allergies  Allergen Reactions   Trazodone Anxiety    Antimicrobials this admission: Ongoing Ceftazidime 9/19>>  Completed Ceftriaxone 9/11>9/12 Azithromycin 9/13>9/16 Amoxicillin-clavulanate 9/13>9/13 Ampicillin-sulbactam 9/14>9/17 Vancomycin 9/16>9/17 Cefepime 9/17>>9/19  Dose adjustments this admission:   Microbiology results:  BCx: pseudomonas 1/4 bottles   UCx: >100,000 CFU staph epi   BCID: pseudomonas detected     MRSA PCR: not detected   Thank you for allowing pharmacy to be a part of this patient's care.  12-22-1995, PharmD Pharmacy Resident  11/23/2020 1:00 PM

## 2020-11-23 NOTE — Progress Notes (Signed)
PROGRESS NOTE    Dustin Vazquez  WUJ:[id number] DOB: 07-20-1955 DOA: 11/14/2020 PCP: [facility]    Brief Narrative:  65 year old male is brought to the hospital after he was found on the side of the road with altered mental status.  Noted to be hypotensive, hypothermic on arrival with elevated creatinine, liver enzymes and lactic acid.  Noted to have urinary tract infection and was admitted to the hospital for septic shock requiring vasopressors.  He was admitted to the ICU for further management.  Overall hemodynamics improved with IV fluids. Transferred to [facility] on 9/14. Found to have 1/4 blood cultures positive for pseudomonas. He has been on IV antibiotics. He has had prolonged hospital course and remains encephalopathic. Neurology consulted for further input. Palliative care following for goals of care.   Assessment & Plan:   Active Problems:   Severe sepsis (HCC)   Severe sepsis with septic shock (CODE) (HCC)   Protein-calorie malnutrition, severe   Bacteremia due to Pseudomonas   Alcoholic cirrhosis of liver without ascites (HCC)   Acute metabolic encephalopathy   Thrombocytopenia (HCC)   Acute urinary retention   Hematuria   Acute lower UTI   Sepsis with septic shock secondary to urinary tract infection/Pseudomonas bacteremia -He has been weaned off of vasopressors -Initially on Unasyn, subsequently transition to cefepime for Pseudomonas coverage -Based on culture sensitivities, cefepime changed to ceftazidime -Urine culture positive for staph epidermidis -1 out of 4 blood cultures positive for Pseudomonas -Infectious diease consult  Acute metabolic encephalopathy -Etiology is not entirely clear -May have a component related to underlying infection, although has been afebrile for several days now -He does have a history of alcoholism and may have a component of Wernicke's, he has been receiving high-dose thiamine -Ammonia level normal, TSH normal  -CT  head negative x2 -MRI brain unremarkable -EEG without evidence of epileptiform discharges -Overall electrolyte abnormalities have corrected -Since he continues to be lethargic/encephalopathic, will request neurology input regarding any further neuro work up. ? If he would benefit from continuous EEG   History of alcohol abuse -He has not required any benzodiazepines in several days  Alcoholic cirrhosis -Continue to follow LFTs -No evidence of ascites at this time  Possible aspiration -Noted to have coughing/rhonchi after taking p.o. meds -Once mental status has improved, can consider swallow evaluation, will keep n.p.o. for now -Chest x-ray with bilateral infiltrates, suspect developing pneumonia -Currently, he is not requiring any oxygen -PCT 1.3 -Continue on IV antibiotics -speech therapy consult  Thrombocytopenia -Likely multifactorial, related to acute infectious process/recent alcohol use/cirrhosis -platelet count has started to trend up -Continue to monitor  Acute kidney injury -Secondary to hypovolemia -Overall renal function has improved with IV hydration  Urinary retention -Noted to have significant hematuria -Urology consulted and coud catheter was placed -Recommended to consider removing catheter once patient is more awake and alert  COPD -No wheezing at this time -Continue bronchodilators and inhaled steroids  Goals of care -Patient was living independently in a camper -Discussed CODE STATUS with patient's son who confirmed DNR -He is overall malnourished and may have difficulty making a meaningful recovery -Palliative care consulted to further address goals of care   DVT prophylaxis:   SCDs  Code Status: DNR, confirmed with patient's son Family Communication: Updated patient's son over the phone 9/19 Disposition Plan: Status is: Inpatient  Remains inpatient appropriate because:Hemodynamically unstable, Altered mental status, IV treatments appropriate  due to intensity of illness or inability to take PO, and Inpatient  level of care appropriate due to severity of illness  Dispo: The patient is from: Home              Anticipated d/c is to:  TBD              Patient currently is not medically stable to d/c.   Difficult to place patient No         Consultants:  PCCM Urology Palliative care Infectious disease Neurology  Procedures:    Antimicrobials:  Unasyn 9/14> 9/17 Azithromycin 9/13> 9/17 Cefepime 9/17>9/19 Ceftazidime 9/19>   Subjective: Patient remains lethargic. Eyes are open at times, but he does not follow commands or answer questions  Objective: Vitals:   11/23/20 0417 11/23/20 0500 11/23/20 0712 11/23/20 1130  BP: 110/70  (!) 103/59 (!) 93/56  Pulse: 88  92 85  Resp: Temp: 98.8 F (37.1 C)  98.7 F (37.1 C) 98.7 F (37.1 C)  TempSrc: Oral     SpO2:    96%  Weight:  59.2 kg    Height:        Intake/Output Summary (Last 24 hours) at 11/23/2020 1142 Last data filed at 11/23/2020 0900 Gross per 24 hour  Intake 4936.41 ml  Output 3150 ml  Net 1786.41 ml   Filed Weights   11/14/20 2052 11/23/20 0500  Weight: 63.5 kg 59.2 kg    Examination:  General exam: lethargic, opens eyes to voice, does not follow commands Respiratory system: occasional rhonchi. Respiratory effort normal. Cardiovascular system:RRR. No murmurs, rubs, gallops. Gastrointestinal system: Abdomen is nondistended, soft and nontender. No organomegaly or masses felt. Normal bowel sounds heard. Central nervous system: cannot participate in exam Extremities: 1+ edema bilaterally Skin: No rashes, lesions or ulcers Psychiatry: nonverbal   Data Reviewed: I have personally reviewed following labs and imaging studies  CBC: Recent Labs  Lab 11/17/20 0641 11/17/20 1554 11/19/20 0623 11/20/20 0434 11/21/20 0412 11/22/20 0419 11/23/20 0330  WBC 5.7   < > 18.2* 5.4 7.0 6.5 7.9  NEUTROABS 3.8  --   --   --   --   --    --   HGB 10.5*   < > 10.2* 9.0* 11.1* 11.1* 11.4*  HCT 32.1*   < > 31.0* 27.3* 32.7* 33.7* 33.9*  MCV 101.6*   < > 104.7* 102.2* 98.2 97.4 98.3  PLT 114*   < > 121* 93* 76* 77* 86*   < > = values in this interval not displayed.   Basic Metabolic Panel: Recent Labs  Lab 11/17/20 0641 11/18/20 0254 11/19/20 0623 11/20/20 0434 11/21/20 0412 11/22/20 0419 11/23/20 0330  NA 145 147* 148* 142 135 134* 132*  K 3.9 3.5 3.4* 3.7 3.9 3.6 3.8  CL 113* 116* 119* 113* 109 108 107  CO2 21* 21*  GLUCOSE 127* 98 89 108* 104* 97 104*  BUN CREATININE 0.66 0.57* 0.91 0.77  0.72 0.57* 0.64 0.51*  CALCIUM 8.4* 8.3* 8.4* 7.7* 7.7* 8.1* 8.0*  MG 2.0  --   --   --   --   --   --   PHOS 2.8 2.6 1.7* 3.2  --   --  2.3*   GFR: Estimated Creatinine Clearance: 76.1 mL/min (A) (by C-G formula based on SCr of 0.51 mg/dL (L)). Liver Function Tests: Recent Labs  Lab 11/18/20 0254 11/19/20 6578 11/20/20 0434 11/21/20 0412 11/22/20 0419 11/23/20 0330  AST 69*  --   --  67* 52*  --   ALT 35  --   --  36 30  --   ALKPHOS 59  --   --  69 73  --   BILITOT 0.8  --   --  1.4* 1.7*  --   PROT 5.4*  --   --  5.4* 5.7*  --   ALBUMIN 2.2* 2.6* 2.3* 2.3* 2.2* 2.1*   No results for input(s): LIPASE, AMYLASE in the last 168 hours.  Recent Labs  Lab 11/17/20 0838 11/20/20 1200  AMMONIA 23 27   Coagulation Profile: No results for input(s): INR, PROTIME in the last 168 hours.  Cardiac Enzymes: No results for input(s): CKTOTAL, CKMB, CKMBINDEX, TROPONINI in the last 168 hours.  BNP (last 3 results) No results for input(s): PROBNP in the last 8760 hours. HbA1C: No results for input(s): HGBA1C in the last 72 hours. CBG: Recent Labs  Lab 11/22/20 1141 11/22/20 1640 11/22/20 2029 11/23/20 0004 11/23/20 0814  GLUCAP 107* 113* 98 106* 104*   Lipid Profile: No results for input(s): CHOL, HDL, LDLCALC, TRIG, CHOLHDL, LDLDIRECT in the last 72 hours. Thyroid  Function Tests: No results for input(s): TSH, T4TOTAL, FREET4, T3FREE, THYROIDAB in the last 72 hours.  Anemia Panel: No results for input(s): VITAMINB12, FOLATE, FERRITIN, TIBC, IRON, RETICCTPCT in the last 72 hours.  Sepsis Labs: Recent Labs  Lab 11/20/20 0434  PROCALCITON 1.30    Recent Results (from the past 240 hour(s))  Blood culture (routine single)     Status: None   Collection Time: 11/14/20  8:08 PM   Specimen: BLOOD  Result Value Ref Range Status   Specimen Description BLOOD RIGHT ANTECUBITAL  Final   Special Requests   Final    BOTTLES DRAWN AEROBIC AND ANAEROBIC Blood Culture adequate volume   Culture   Final    NO GROWTH 5 DAYS Performed at [facility], [address] [place], [place] [address]    Report Status 11/19/2020 FINAL  Final  Urine Culture     Status: Abnormal   Collection Time: 11/14/20  8:08 PM   Specimen: Urine, Random  Result Value Ref Range Status   Specimen Description   Final    URINE, RANDOM Performed at [facility], [address], [place], [place] [address]    Special Requests   Final    NONE Performed at [facility], [address], [place], [place] [address]    Culture >=100,000 COLONIES/mL STAPHYLOCOCCUS EPIDERMIDIS (A)  Final   Report Status 11/17/2020 FINAL  Final   Organism ID, Bacteria STAPHYLOCOCCUS EPIDERMIDIS (A)  Final      Susceptibility   Staphylococcus epidermidis - MIC*    CIPROFLOXACIN 2 INTERMEDIATE Intermediate     GENTAMICIN <=0.5 SENSITIVE Sensitive     NITROFURANTOIN <=16 SENSITIVE Sensitive     OXACILLIN <=0.25 SENSITIVE Sensitive     TETRACYCLINE <=1 SENSITIVE Sensitive     VANCOMYCIN 2 SENSITIVE Sensitive     TRIMETH/SULFA 80 RESISTANT Resistant     CLINDAMYCIN <=0.25 SENSITIVE Sensitive     RIFAMPIN <=0.5 SENSITIVE Sensitive     Inducible Clindamycin NEGATIVE Sensitive     * >=100,000 COLONIES/mL STAPHYLOCOCCUS EPIDERMIDIS  Resp Panel by RT-PCR (Flu A&B, Covid)  Nasopharyngeal Swab     Status: None   Collection Time: 11/14/20  9:45 PM   Specimen: Nasopharyngeal Swab; Nasopharyngeal(NP) swabs in vial transport medium  Result Value Ref Range Status   SARS  Coronavirus 2 by RT PCR NEGATIVE NEGATIVE Final    Comment: (NOTE) SARS-CoV-2 target nucleic acids are NOT DETECTED.  The SARS-CoV-2 RNA is generally detectable in upper respiratory specimens during the acute phase of infection. The lowest concentration of SARS-CoV-2 viral copies this assay can detect is 138 copies/mL. A negative result does not preclude SARS-Cov-2 infection and should not be used as the sole basis for treatment or other patient management decisions. A negative result may occur with  improper specimen collection/handling, submission of specimen other than nasopharyngeal swab, presence of viral mutation(s) within the areas targeted by this assay, and inadequate number of viral copies(<138 copies/mL). A negative result must be combined with clinical observations, patient history, and epidemiological information. The expected result is Negative.  Fact Sheet for Patients:  BloggerCourse.com  Fact Sheet for Healthcare Providers:  SeriousBroker.it  This test is no t yet approved or cleared by the [place] FDA and  has been authorized for detection and/or diagnosis of SARS-CoV-2 by FDA under an Emergency Use Authorization (EUA). This EUA will remain  in effect (meaning this test can be used) for the duration of the COVID-19 declaration under Section 564(b)(1) of the Act, 21 U.S.C.section 360bbb-3(b)(1), unless the authorization is terminated  or revoked sooner.       Influenza A by PCR NEGATIVE NEGATIVE Final   Influenza B by PCR NEGATIVE NEGATIVE Final    Comment: (NOTE) The Xpert Xpress SARS-CoV-2/FLU/RSV plus assay is intended as an aid in the diagnosis of influenza from Nasopharyngeal swab specimens and should not be  used as a sole basis for treatment. Nasal washings and aspirates are unacceptable for Xpert Xpress SARS-CoV-2/FLU/RSV testing.  Fact Sheet for Patients: BloggerCourse.com  Fact Sheet for Healthcare Providers: SeriousBroker.it  This test is not yet approved or cleared by the [place] FDA and has been authorized for detection and/or diagnosis of SARS-CoV-2 by FDA under an Emergency Use Authorization (EUA). This EUA will remain in effect (meaning this test can be used) for the duration of the COVID-19 declaration under Section 564(b)(1) of the Act, 21 U.S.C. section 360bbb-3(b)(1), unless the authorization is terminated or revoked.  Performed at [facility], [address] [place], [place] [address]   Culture, blood (single) w Reflex to ID Panel     Status: None   Collection Time: 11/15/20  1:50 AM   Specimen: BLOOD  Result Value Ref Range Status   Specimen Description BLOOD BLOOD RIGHT HAND  Final   Special Requests   Final    BOTTLES DRAWN AEROBIC AND ANAEROBIC Blood Culture adequate volume   Culture   Final    NO GROWTH 5 DAYS Performed at [facility], [address], [place], [place] [address]    Report Status 11/20/2020 FINAL  Final  MRSA Next Gen by PCR, Nasal     Status: None   Collection Time: 11/15/20  1:54 AM   Specimen: Nasal Mucosa; Nasal Swab  Result Value Ref Range Status   MRSA by PCR Next Gen NOT DETECTED NOT DETECTED Final    Comment: (NOTE) The GeneXpert MRSA Assay (FDA approved for NASAL specimens only), is one component of a comprehensive MRSA colonization surveillance program. It is not intended to diagnose MRSA infection nor to guide or monitor treatment for MRSA infections. Test performance is not FDA approved in patients less than 62 years old. Performed at [facility], [address] [place], [place] [address]   CULTURE, BLOOD (ROUTINE X 2) w Reflex to  ID Panel      Status: Abnormal   Collection Time: 11/19/20 10:35 AM   Specimen: BLOOD  Result Value Ref Range Status   Specimen Description   Final    BLOOD BLOOD LEFT HAND Performed at [facility], [address], [place], [place] [address]    Special Requests   Final    BOTTLES DRAWN AEROBIC AND ANAEROBIC Blood Culture adequate volume Performed at [facility], [address] [place], [place] [address]    Culture  Setup Time   Final    GRAM NEGATIVE RODS AEROBIC BOTTLE ONLY CRITICAL RESULT CALLED TO, READ BACK BY AND VERIFIED WITH: JASON ROBBINS AT 0500 11/20/20.PMF Performed at [facility], [address], [place], [place] [address]    Culture PSEUDOMONAS AERUGINOSA (A)  Final   Report Status 11/22/2020 FINAL  Final   Organism ID, Bacteria PSEUDOMONAS AERUGINOSA  Final      Susceptibility   Pseudomonas aeruginosa - MIC*    CEFTAZIDIME 4 SENSITIVE Sensitive     CIPROFLOXACIN <=0.25 SENSITIVE Sensitive     GENTAMICIN <=1 SENSITIVE Sensitive     IMIPENEM 2 SENSITIVE Sensitive     PIP/TAZO 8 SENSITIVE Sensitive     CEFEPIME 2 SENSITIVE Sensitive     * PSEUDOMONAS AERUGINOSA  Blood Culture ID Panel (Reflexed)     Status: Abnormal   Collection Time: 11/19/20 10:35 AM  Result Value Ref Range Status   Enterococcus faecalis NOT DETECTED NOT DETECTED Final   Enterococcus Faecium NOT DETECTED NOT DETECTED Final   Listeria monocytogenes NOT DETECTED NOT DETECTED Final   Staphylococcus species NOT DETECTED NOT DETECTED Final   Staphylococcus aureus (BCID) NOT DETECTED NOT DETECTED Final   Staphylococcus epidermidis NOT DETECTED NOT DETECTED Final   Staphylococcus lugdunensis NOT DETECTED NOT DETECTED Final   Streptococcus species NOT DETECTED NOT DETECTED Final   Streptococcus agalactiae NOT DETECTED NOT DETECTED Final   Streptococcus pneumoniae NOT DETECTED NOT DETECTED Final   Streptococcus pyogenes NOT DETECTED NOT DETECTED Final   A.calcoaceticus-baumannii NOT  DETECTED NOT DETECTED Final   Bacteroides fragilis NOT DETECTED NOT DETECTED Final   Enterobacterales NOT DETECTED NOT DETECTED Final   Enterobacter cloacae complex NOT DETECTED NOT DETECTED Final   Escherichia coli NOT DETECTED NOT DETECTED Final   Klebsiella aerogenes NOT DETECTED NOT DETECTED Final   Klebsiella oxytoca NOT DETECTED NOT DETECTED Final   Klebsiella pneumoniae NOT DETECTED NOT DETECTED Final   Proteus species NOT DETECTED NOT DETECTED Final   Salmonella species NOT DETECTED NOT DETECTED Final   Serratia marcescens NOT DETECTED NOT DETECTED Final   Haemophilus influenzae NOT DETECTED NOT DETECTED Final   Neisseria meningitidis NOT DETECTED NOT DETECTED Final   Pseudomonas aeruginosa DETECTED (A) NOT DETECTED Final    Comment: CRITICAL RESULT CALLED TO, READ BACK BY AND VERIFIED WITH: JASON ROBBINS AT 0500 11/20/20.PMF    Stenotrophomonas maltophilia NOT DETECTED NOT DETECTED Final   Candida albicans NOT DETECTED NOT DETECTED Final   Candida auris NOT DETECTED NOT DETECTED Final   Candida glabrata NOT DETECTED NOT DETECTED Final   Candida krusei NOT DETECTED NOT DETECTED Final   Candida parapsilosis NOT DETECTED NOT DETECTED Final   Candida tropicalis NOT DETECTED NOT DETECTED Final   Cryptococcus neoformans/gattii NOT DETECTED NOT DETECTED Final   CTX-M ESBL NOT DETECTED NOT DETECTED Final   Carbapenem resistance IMP NOT DETECTED NOT DETECTED Final   Carbapenem resistance KPC NOT DETECTED NOT DETECTED Final   Carbapenem resistance NDM  NOT DETECTED NOT DETECTED Final   Carbapenem resistance VIM NOT DETECTED NOT DETECTED Final    Comment: Performed at [facility], [address] [place], [place] [address]  CULTURE, BLOOD (ROUTINE X 2) w Reflex to ID Panel     Status: None (Preliminary result)   Collection Time: 11/19/20 10:40 AM   Specimen: BLOOD  Result Value Ref Range Status   Specimen Description BLOOD BLOOD RIGHT WRIST  Final   Special Requests   Final     BOTTLES DRAWN AEROBIC AND ANAEROBIC Blood Culture adequate volume   Culture   Final    NO GROWTH 4 DAYS Performed at [facility], [address], [place], [place] [address]    Report Status PENDING  Incomplete         Radiology Studies: ECHOCARDIOGRAM COMPLETE  Result Date: 11/22/2020    ECHOCARDIOGRAM REPORT   Patient Name:   Dustin Vazquez Date of Exam: 11/22/2020 Medical Rec #:  [id number]         Height:       66.0 in Accession #:    [id number]        Weight:       140.0 lb Date of Birth:  1955/07/29         BSA:          1.719 m Patient Age:    65 years          BP:           101/68 mmHg Patient Gender: M                 HR:           94 bpm. Exam Location:  [facility] Procedure: 2D Echo, Color Doppler and Cardiac Doppler Indications:     I50.9 Congestive Heart Failure  History:         Patient has no prior history of Echocardiogram examinations.                  COPD; Signs/Symptoms:Altered mental status.  Sonographer:     Humphrey Rolls Referring Phys:  [id number] Paolo Okane Diagnosing Phys: Lorine Bears MD  Sonographer Comments: Suboptimal parasternal window. Image acquisition challenging due to uncooperative patient and Image acquisition challenging due to COPD. IMPRESSIONS  1. Left ventricular ejection fraction, by estimation, is 55 to 60%. The left ventricle has normal function. The left ventricle has no regional wall motion abnormalities. Left ventricular diastolic parameters were normal.  2. Right ventricular systolic function is normal. The right ventricular size is normal. Tricuspid regurgitation signal is inadequate for assessing PA pressure.  3. The mitral valve is normal in structure. No evidence of mitral valve regurgitation. No evidence of mitral stenosis.  4. The aortic valve was not well visualized. Aortic valve regurgitation is not visualized. Mild aortic valve stenosis. Aortic valve area, by VTI measures 1.71 cm. Aortic valve mean gradient measures 9.0 mmHg.  5. The  inferior vena cava is normal in size with greater than 50% respiratory variability, suggesting right atrial pressure of 3 mmHg. FINDINGS  Left Ventricle: Left ventricular ejection fraction, by estimation, is 55 to 60%. The left ventricle has normal function. The left ventricle has no regional wall motion abnormalities. The left ventricular internal cavity size was normal in size. There is  no left ventricular hypertrophy. Left ventricular diastolic parameters were normal. Right Ventricle: The right ventricular size is normal. No increase in right ventricular wall thickness. Right ventricular systolic function is  normal. Tricuspid regurgitation signal is inadequate for assessing PA pressure. Left Atrium: Left atrial size was normal in size. Right Atrium: Right atrial size was normal in size. Pericardium: There is no evidence of pericardial effusion. Mitral Valve: The mitral valve is normal in structure. No evidence of mitral valve regurgitation. No evidence of mitral valve stenosis. MV peak gradient, 3.0 mmHg. The mean mitral valve gradient is 2.0 mmHg. Tricuspid Valve: The tricuspid valve is normal in structure. Tricuspid valve regurgitation is not demonstrated. No evidence of tricuspid stenosis. Aortic Valve: The aortic valve was not well visualized. Aortic valve regurgitation is not visualized. Mild aortic stenosis is present. Aortic valve mean gradient measures 9.0 mmHg. Aortic valve peak gradient measures 16.6 mmHg. Aortic valve area, by VTI measures 1.71 cm. Pulmonic Valve: The pulmonic valve was normal in structure. Pulmonic valve regurgitation is not visualized. No evidence of pulmonic stenosis. Aorta: The aortic root is normal in size and structure. Venous: The inferior vena cava is normal in size with greater than 50% respiratory variability, suggesting right atrial pressure of 3 mmHg. IAS/Shunts: No atrial level shunt detected by color flow Doppler.  LEFT VENTRICLE PLAX 2D LVIDd:         3.70 cm   Diastology LVIDs:         2.40 cm  LV e' medial:    7.62 cm/s LV PW:         0.90 cm  LV E/e' medial:  12.3 LV IVS:        0.70 cm  LV e' lateral:   8.38 cm/s LVOT diam:     2.00 cm  LV E/e' lateral: 11.2 LV SV:         53 LV SV Index:   31 LVOT Area:     3.14 cm  RIGHT VENTRICLE RV Basal diam:  2.30 cm LEFT ATRIUM             Index       RIGHT ATRIUM          Index LA diam:        3.80 cm 2.21 cm/m  RA Area:     5.89 cm LA Vol (A2C):   17.9 ml 10.42 ml/m RA Volume:   8.62 ml  5.02 ml/m LA Vol (A4C):   30.7 ml 17.86 ml/m LA Biplane Vol: 24.2 ml 14.08 ml/m  AORTIC VALVE                    PULMONIC VALVE AV Area (Vmax):    1.43 cm     PV Vmax:       0.69 m/s AV Area (Vmean):   1.53 cm     PV Vmean:      37.800 cm/s AV Area (VTI):     1.71 cm     PV VTI:        0.078 m AV Vmax:           204.00 cm/s  PV Peak grad:  1.9 mmHg AV Vmean:          139.000 cm/s PV Mean grad:  1.0 mmHg AV VTI:            0.308 m AV Peak Grad:      16.6 mmHg AV Mean Grad:      9.0 mmHg LVOT Vmax:         92.70 cm/s LVOT Vmean:        67.600 cm/s LVOT VTI:  0.168 m LVOT/AV VTI ratio: 0.55  AORTA Ao Root diam: 3.00 cm MITRAL VALVE MV Area (PHT): 4.29 cm    SHUNTS MV Area VTI:   3.00 cm    Systemic VTI:  0.17 m MV Peak grad:  3.0 mmHg    Systemic Diam: 2.00 cm MV Mean grad:  2.0 mmHg MV Vmax:       0.86 m/s MV Vmean:      65.7 cm/s MV Decel Time: 177 msec MV E velocity: 93.80 cm/s MV A velocity: 79.70 cm/s MV E/A ratio:  1.18 Lorine Bears MD Electronically signed by Lorine Bears MD Signature Date/Time: 11/22/2020/1:53:54 PM    Final         Scheduled Meds:  budesonide (PULMICORT) nebulizer solution  0.25 mg Nebulization BID   Chlorhexidine Gluconate Cloth  6 each Topical Daily   feeding supplement  237 mL Oral BID BM    morphine injection  1 mg Intravenous Once   pantoprazole (PROTONIX) IV  40 mg Intravenous Q24H   Continuous Infusions:  sodium chloride 250 mL (11/17/20 1235)   cefTAZidime (FORTAZ)  IV 2 g  (11/23/20 0517)   dextrose 5 % with KCl 20 mEq / L 20 mEq (11/22/20 0440)   folic acid (FOLVITE) IVPB 1 mg (11/23/20 1032)   potassium PHOSPHATE IVPB (in mmol)     sodium chloride Stopped (11/19/20 1107)   thiamine injection Stopped (11/22/20 2230)     LOS: 9 days    Time spent: 35 mins    Erick Blinks, MD [facility]   If 7PM-7AM, please contact night-coverage [email or web address]  11/23/2020, 11:42 AM

## 2020-11-24 ENCOUNTER — Inpatient Hospital Stay: Payer: Medicare Other

## 2020-11-24 DIAGNOSIS — R7881 Bacteremia: Secondary | ICD-10-CM | POA: Diagnosis not present

## 2020-11-24 DIAGNOSIS — Z7189 Other specified counseling: Secondary | ICD-10-CM

## 2020-11-24 DIAGNOSIS — Z515 Encounter for palliative care: Secondary | ICD-10-CM

## 2020-11-24 DIAGNOSIS — K7030 Alcoholic cirrhosis of liver without ascites: Secondary | ICD-10-CM

## 2020-11-24 DIAGNOSIS — G9341 Metabolic encephalopathy: Secondary | ICD-10-CM | POA: Diagnosis not present

## 2020-11-24 DIAGNOSIS — B965 Pseudomonas (aeruginosa) (mallei) (pseudomallei) as the cause of diseases classified elsewhere: Secondary | ICD-10-CM | POA: Diagnosis not present

## 2020-11-24 LAB — CULTURE, BLOOD (ROUTINE X 2)
Culture: NO GROWTH
Special Requests: ADEQUATE

## 2020-11-24 LAB — GLUCOSE, CAPILLARY
Glucose-Capillary: 75 mg/dL (ref 70–99)
Glucose-Capillary: 92 mg/dL (ref 70–99)
Glucose-Capillary: 102 mg/dL — ABNORMAL HIGH (ref 70–99)
Glucose-Capillary: 97 mg/dL (ref 70–99)
Glucose-Capillary: 102 mg/dL — ABNORMAL HIGH (ref 70–99)
Glucose-Capillary: 110 mg/dL — ABNORMAL HIGH (ref 70–99)
Glucose-Capillary: 92 mg/dL (ref 70–99)

## 2020-11-24 LAB — CREATININE, SERUM
Creatinine, Ser: 0.48 mg/dL — ABNORMAL LOW (ref 0.61–1.24)
GFR, Estimated: >60 mL/min (ref >60)

## 2020-11-24 LAB — RPR: RPR Ser Ql: NONREACTIVE

## 2020-11-24 MED ORDER — FOLIC ACID 1 MG PO TABS
1.0000 mg | ORAL_TABLET | Freq: Every day | ORAL | Status: DC
  Administered 2020-11-25 – 2020-12-01 (×7): 1 mg
  Filled 2020-11-24 (×7): qty 1

## 2020-11-24 MED ORDER — ENOXAPARIN SODIUM 40 MG/0.4ML IJ SOSY
40.0000 mg | PREFILLED_SYRINGE | INTRAMUSCULAR | Status: DC
  Administered 2020-11-24 – 2020-12-06 (×13): 40 mg via SUBCUTANEOUS
  Filled 2020-11-24 (×13): qty 0.4

## 2020-11-24 MED ORDER — ADULT MULTIVITAMIN LIQUID CH
15.0000 mL | Freq: Every day | ORAL | Status: DC
  Filled 2020-11-24: qty 15

## 2020-11-24 MED ORDER — THIAMINE HCL 100 MG PO TABS
100.0000 mg | ORAL_TABLET | Freq: Every day | ORAL | Status: DC
  Administered 2020-11-24 – 2020-12-07 (×14): 100 mg via ORAL
  Filled 2020-11-24 (×14): qty 1

## 2020-11-24 MED ORDER — FREE WATER: 30.0000 mL | Status: DC

## 2020-11-24 MED ORDER — OSMOLITE 1.2 CAL PO LIQD
1000.0000 mL | ORAL | Status: DC
  Administered 2020-11-24: 1000 mL

## 2020-11-24 MED ORDER — FREE WATER
30.0000 mL | Status: DC
  Administered 2020-11-24 – 2020-11-25 (×5): 30 mL

## 2020-11-24 MED ORDER — OSMOLITE 1.2 CAL PO LIQD: 1000.0000 mL | ORAL | Status: DC

## 2020-11-24 NOTE — Evaluation (Signed)
Clinical/Bedside Swallow Evaluation Patient Details  Name: Dustin Vazquez MRN: [id number] Date of Birth: Oct 04, 1955  Today's Date: 11/24/2020 Time: SLP Start Time (ACUTE ONLY): 0820 SLP Stop Time (ACUTE ONLY): 0910 SLP Time Calculation (min) (ACUTE ONLY): 50 min  Past Medical History:  Past Medical History:  Diagnosis Date   COPD (chronic obstructive pulmonary disease) (HCC)    Past Surgical History: History reviewed. No pertinent surgical history. HPI:  Pt is a 64 y.o. male with hx of COPD brought to ED after pt was found down on the side of the road in the rain. Found to be hypotensive, hypothermic, and with AMS.  Per Dietician, pt has Severe Malnutrition.  Current Dxs: Acute metabolic encephalopathy, Sepsis with septic shock secondary to urinary tract infection; possible ETOH withdrawal d/t h/o ETOH abuse, Alcoholic cirrhosis.  CXR: diffuse bilateral interstitial pulmonary opacity and pulmonary  vascular prominence, most consistent with edema. No focal airspace  opacity.  MRI: No evidence of acute intracranial abnormality.  2. Age advanced generalized atrophy.    Assessment / Plan / Recommendation  Clinical Impression  Pt appears to present w/ oropharyngeal phase dysphagia in light of Significantly declined Cognitive status; encephalopathy and illness. This can impact his overall awareness/timing of swallow and safety during po tasks which increases risk for aspiration, choking. Due to pt's presentation w/ po trials and risk for aspiration, it is recommended that he remain NPO w/ consideration of alternative means of feeding (NG support).      He required mod-max verbal/ visual cues for during po tasks. Pt was fed trials of ice chips, purees, thin and Nectar liquids w/ increasing, overt clinical s/s of aspiration noted as trials continued c/b decline in respirations and vocal quality(mildly wet) as well as delayed throat clearing and cough(which was reduced in effort d/t Cognitive decline).  Respiratory status remained calm and unlabored during/post trials. Oral phase was c/b prolonged bolus management and oral phase time w/ increased textures; suspect lack of control w/ thin liquids(and quick A-P spillage). Oral clearing achieved w/ boluses given Time. Pt required Max support and guidance w/ feeding and intake of trials d/t Cognitive decline. OM Exam was cursory d/t Cognitive status but lingual/labial symmetry noted w/ no overt unilateral weakness noted. Confusion during OM tasks and oral care; debris coated oral cavity which was cleaned/cleared by this Clinician w/ oral care. Most Dentition Missing.         D/t pt's declined Cognitive status and risk for aspiration, recommend continue NPO status w/ alternative means of feeding including Pills. MD and  NSG updated. ST services will continue to follow for appropriate time for ongoing assessment of toleration of po trial/oral intake in hopes to establish least restrictive diet. Recommend frequent oral care for hygiene and stimulation of swallowing. SLP Visit Diagnosis: Dysphagia, oropharyngeal phase (R13.12) (Cognitive decline)    Aspiration Risk  Moderate aspiration risk;Risk for inadequate nutrition/hydration    Diet Recommendation   NPO w/ alternative means of feeding(NG)  Medication Administration: Via alternative means    Other  Recommendations Recommended Consults:  (Dietician following; Palliative Care f/u for GOC) Oral Care Recommendations: Oral care QID;Staff/trained caregiver to provide oral care Other Recommendations:  (TBD)    Recommendations for follow up therapy are one component of a multi-disciplinary discharge planning process, led by the attending physician.  Recommendations may be updated based on patient status, additional functional criteria and insurance authorization.  Follow up Recommendations Skilled Nursing facility (TBD)      Frequency and Duration min  2x/week  2 weeks       Prognosis Prognosis for  Safe Diet Advancement: Guarded Barriers to Reach Goals: Cognitive deficits;Language deficits;Time post onset;Severity of deficits;Behavior      Swallow Study   General Date of Onset: 11/14/20 HPI: Pt is a 65 y.o. male with hx of COPD brought to ED after pt was found down on the side of the road in the rain. Found to be hypotensive, hypothermic, and with AMS.  Per Dietician, pt has Severe Malnutrition.  Current Dxs: Acute metabolic encephalopathy, Sepsis with septic shock secondary to urinary tract infection; possible ETOH withdrawal d/t h/o ETOH abuse, Alcoholic cirrhosis.  CXR: diffuse bilateral interstitial pulmonary opacity and pulmonary  vascular prominence, most consistent with edema. No focal airspace  opacity.  MRI: No evidence of acute intracranial abnormality.  2. Age advanced generalized atrophy. Type of Study: Bedside Swallow Evaluation Previous Swallow Assessment: none Diet Prior to this Study: NPO Temperature Spikes Noted: No (wbc 7.9) Respiratory Status: Room air History of Recent Intubation: No Behavior/Cognition: Alert;Cooperative;Confused;Requires cueing;Doesn't follow directions;Distractible Oral Cavity Assessment: Dried secretions;Dry Oral Care Completed by SLP: Yes Oral Cavity - Dentition: Missing dentition (most) Vision:  (n/a) Self-Feeding Abilities: Total assist Patient Positioning: Upright in bed (needed positioning) Baseline Vocal Quality: Low vocal intensity (mumbled phonations) Volitional Cough: Cognitively unable to elicit Volitional Swallow: Unable to elicit    Oral/Motor/Sensory Function Overall Oral Motor/Sensory Function: Generalized oral weakness (oral confusion) Facial Symmetry: Within Functional Limits Lingual Symmetry: Within Functional Limits   Ice Chips Ice chips: Impaired Presentation: Spoon (fed; 2 trials) Oral Phase Impairments: Poor awareness of bolus Oral Phase Functional Implications: Prolonged oral transit Pharyngeal Phase Impairments:   (none)   Thin Liquid Thin Liquid: Impaired Presentation: Spoon (5 tsps) Oral Phase Impairments: Poor awareness of bolus Pharyngeal  Phase Impairments: Suspected delayed Swallow;Throat Clearing - Delayed;Cough - Delayed    Nectar Thick Nectar Thick Liquid: Impaired Presentation: Spoon (fed; 5-6 trials) Oral Phase Impairments: Poor awareness of bolus Oral phase functional implications: Prolonged oral transit Pharyngeal Phase Impairments: Suspected delayed Swallow;Throat Clearing - Delayed;Cough - Delayed   Honey Thick Honey Thick Liquid: Not tested   Puree Puree: Impaired Presentation: Spoon (fed; 2 trials) Oral Phase Impairments: Poor awareness of bolus Oral Phase Functional Implications: Prolonged oral transit;Oral holding Pharyngeal Phase Impairments: Cough - Delayed;Throat Clearing - Delayed   Solid     Solid: Not tested         Jerilynn Som, MS, CCC-SLP Speech Language Pathologist [facility] [phone] Tanza Pellot 11/24/2020,2:35 PM

## 2020-11-24 NOTE — Progress Notes (Signed)
PROGRESS NOTE    Dustin Vazquez  XTK:[id number] DOB: 05/10/1955 DOA: 11/14/2020 PCP: [facility]   Brief Narrative:  65 year old male is brought to the hospital after he was found on the side of the road with altered mental status.  Noted to be hypotensive, hypothermic on arrival with elevated creatinine, liver enzymes and lactic acid.  Noted to have urinary tract infection and was admitted to the hospital for septic shock requiring vasopressors.  He was admitted to the ICU for further management.  Overall hemodynamics improved with IV fluids. Transferred to [facility] on 9/14. Found to have 1/4 blood cultures positive for pseudomonas. He has been on IV antibiotics. He has had prolonged hospital course and remains encephalopathic. Neurology consulted for further input. Palliative care following for goals of care.  9/21: Patient remains encephalopathic.  Discussed with RD, plan to place Dobbhoff and initiate nasogastric tube feeds.   Assessment & Plan:   Active Problems:   Severe sepsis (HCC)   Severe sepsis with septic shock (CODE) (HCC)   Protein-calorie malnutrition, severe   Bacteremia due to Pseudomonas   Alcoholic cirrhosis of liver without ascites (HCC)   Acute metabolic encephalopathy   Thrombocytopenia (HCC)   Acute urinary retention   Hematuria   Acute lower UTI  Sepsis with septic shock secondary to urinary tract infection/Pseudomonas bacteremia -He has been weaned off of vasopressors -Initially on Unasyn, subsequently transition to cefepime for Pseudomonas coverage -Based on culture sensitivities, cefepime changed to ceftazidime -Urine culture positive for staph epidermidis -1 out of 4 blood cultures positive for Pseudomonas Plan: Continue Fortaz for now Infectious disease consulted, recommendations appreciated   Acute metabolic encephalopathy -Etiology is not entirely clear -May have a component related to underlying infection, although has been afebrile  for several days now -He does have a history of alcoholism and may have a component of Wernicke's, he has been receiving high-dose thiamine -Ammonia level normal, TSH normal  -CT head negative x2 -MRI brain unremarkable -EEG without evidence of epileptiform discharges -Overall electrolyte abnormalities have corrected -Encephalopathy persists -No further recommendations from neurology standpoint Plan: Completed high-dose thiamine therapy Initiate p.o. thiamine 100 mg daily Possible nutritional component, Dobbhoff to be placed and tube feeds initiated today Continue to monitor mental status   History of alcohol abuse -He has not required any benzodiazepines in several days' -CIWA protocol discontinued   Alcoholic cirrhosis -Continue to follow LFTs -No evidence of ascites at this time   Possible aspiration -Noted to have coughing/rhonchi after taking p.o. meds -Once mental status has improved, can consider swallow evaluation, will keep n.p.o. for now -Chest x-ray with bilateral infiltrates, suspect developing pneumonia -Currently, he is not requiring any oxygen -PCT 1.3 -Continue on IV antibiotics -speech therapy consult   Thrombocytopenia -Likely multifactorial, related to acute infectious process/recent alcohol use/cirrhosis -platelet count has started to trend up -Continue to monitor   Acute kidney injury -Secondary to hypovolemia -Overall renal function has improved with IV hydration   Urinary retention -Noted to have significant hematuria -Urology consulted and coud catheter was placed -Recommended to consider removing catheter once patient is more awake and alert   COPD -No wheezing at this time -Continue bronchodilators and inhaled steroids   Goals of care -Patient was living independently in a camper -Discussed CODE STATUS with patient's son who confirmed DNR -He is overall malnourished and may have difficulty making a meaningful recovery -Palliative care  consulted to further address goals of care   DVT prophylaxis: SCDs Code Status:  DNR Family Communication: None today Disposition Plan: Status is: Inpatient  Remains inpatient appropriate because:Altered mental status and Inpatient level of care appropriate due to severity of illness  Dispo: The patient is from: Home              Anticipated d/c is to:  TBD              Patient currently is not medically stable to d/c.   Difficult to place patient No       Level of care: Progressive Cardiac  Consultants:  Neurology Palliative care Infectious disease  Procedures:  NG tube placement 9/21  Antimicrobials:  Ceftazidime   Subjective: Seen and examined.  Remains encephalopathic.  Cannot participate in interview.  [place]umbles.  Objective: Vitals:   11/24/20 0411 11/24/20 0500 11/24/20 0823 11/24/20 1157  BP: 118/69  100/67 (!) 100/53  Pulse: 92  81 80  Resp: 18  18 17   Temp: (!) 97.3 F (36.3 C)  97.8 F (36.6 C) 98.1 F (36.7 C)  TempSrc:      SpO2: 98%  98% 99%  Weight:  60.1 kg    Height:        Intake/Output Summary (Last 24 hours) at 11/24/2020 1357 Last data filed at 11/24/2020 0533 Gross per 24 hour  Intake 66.4 ml  Output 1050 ml  Net -983.6 ml   Filed Weights   11/14/20 2052 11/23/20 0500 11/24/20 0500  Weight: 63.5 kg 59.2 kg 60.1 kg    Examination:  General exam: No acute distress.  Appears chronically ill Respiratory system: Poor respiratory effort.  Lungs clear.  Normal work of breathing.  Room air Cardiovascular system: S1-S2, RRR, no murmurs, no pedal edema Gastrointestinal system: Soft, nontender, nondistended, normal bowel sounds Central nervous system: Encephalopathic, oriented x0 Extremities: Unable to assess Skin: No rashes, lesions or ulcers Psychiatry: Unable to assess    Data Reviewed: I have personally reviewed following labs and imaging studies  CBC: Recent Labs  Lab 11/19/20 0623 11/20/20 0434 11/21/20 0412  11/22/20 0419 11/23/20 0330  WBC 18.2* 5.4 7.0 6.5 7.9  HGB 10.2* 9.0* 11.1* 11.1* 11.4*  HCT 31.0* 27.3* 32.7* 33.7* 33.9*  MCV 104.7* 102.2* 98.2 97.4 98.3  PLT 121* 93* 76* 77* 86*   Basic Metabolic Panel: Recent Labs  Lab 11/18/20 0254 11/19/20 0623 11/20/20 0434 11/21/20 0412 11/22/20 0419 11/23/20 0330 11/24/20 0452  NA 147* 148* 142 135 134* 132*  --   K 3.5 3.4* 3.7 3.9 3.6 3.8  --   CL 116* 119* 113* 109 108 107  --   CO2 24 23 23 22  21* 21*  --   GLUCOSE 98 89 108* 104* 97 104*  --   BUN 14 13 13 11 10 9   --   CREATININE 0.57* 0.91 0.77  0.72 0.57* 0.64 0.51* 0.48*  CALCIUM 8.3* 8.4* 7.7* 7.7* 8.1* 8.0*  --   PHOS 2.6 1.7* 3.2  --   --  2.3*  --    GFR: Estimated Creatinine Clearance: 77.2 mL/min (A) (by C-G formula based on SCr of 0.48 mg/dL (L)). Liver Function Tests: Recent Labs  Lab 11/18/20 0254 11/19/20 0623 11/20/20 0434 11/21/20 0412 11/22/20 0419 11/23/20 0330  AST 69*  --   --  67* 52*  --   ALT 35  --   --  36 30  --   ALKPHOS 59  --   --  69 73  --   BILITOT 0.8  --   --  1.4* 1.7*  --   PROT 5.4*  --   --  5.4* 5.7*  --   ALBUMIN 2.2* 2.6* 2.3* 2.3* 2.2* 2.1*   No results for input(s): LIPASE, AMYLASE in the last 168 hours. Recent Labs  Lab 11/20/20 1200  AMMONIA 27   Coagulation Profile: No results for input(s): INR, PROTIME in the last 168 hours. Cardiac Enzymes: No results for input(s): CKTOTAL, CKMB, CKMBINDEX, TROPONINI in the last 168 hours. BNP (last 3 results) No results for input(s): PROBNP in the last 8760 hours. HbA1C: No results for input(s): HGBA1C in the last 72 hours. CBG: Recent Labs  Lab 11/23/20 2101 11/24/20 0146 11/24/20 0456 11/24/20 0825 11/24/20 1157  GLUCAP 77 75 92 102* 97   Lipid Profile: No results for input(s): CHOL, HDL, LDLCALC, TRIG, CHOLHDL, LDLDIRECT in the last 72 hours. Thyroid Function Tests: No results for input(s): TSH, T4TOTAL, FREET4, T3FREE, THYROIDAB in the last 72  hours. Anemia Panel: Recent Labs    11/23/20 1222  VITAMINB12 1,724*   Sepsis Labs: Recent Labs  Lab 11/20/20 0434  PROCALCITON 1.30    Recent Results (from the past 240 hour(s))  Blood culture (routine single)     Status: None   Collection Time: 11/14/20  8:08 PM   Specimen: BLOOD  Result Value Ref Range Status   Specimen Description BLOOD RIGHT ANTECUBITAL  Final   Special Requests   Final    BOTTLES DRAWN AEROBIC AND ANAEROBIC Blood Culture adequate volume   Culture   Final    NO GROWTH 5 DAYS Performed at [facility], [address] [place], [place] [address]    Report Status 11/19/2020 FINAL  Final  Urine Culture     Status: Abnormal   Collection Time: 11/14/20  8:08 PM   Specimen: Urine, Random  Result Value Ref Range Status   Specimen Description   Final    URINE, RANDOM Performed at [facility], [address] [place], [place] [address]    Special Requests   Final    NONE Performed at [facility], [address] [place], [place] [address]    Culture >=100,000 COLONIES/mL STAPHYLOCOCCUS EPIDERMIDIS (A)  Final   Report Status 11/17/2020 FINAL  Final   Organism ID, Bacteria STAPHYLOCOCCUS EPIDERMIDIS (A)  Final      Susceptibility   Staphylococcus epidermidis - MIC*    CIPROFLOXACIN 2 INTERMEDIATE Intermediate     GENTAMICIN <=0.5 SENSITIVE Sensitive     NITROFURANTOIN <=16 SENSITIVE Sensitive     OXACILLIN <=0.25 SENSITIVE Sensitive     TETRACYCLINE <=1 SENSITIVE Sensitive     VANCOMYCIN 2 SENSITIVE Sensitive     TRIMETH/SULFA 80 RESISTANT Resistant     CLINDAMYCIN <=0.25 SENSITIVE Sensitive     RIFAMPIN <=0.5 SENSITIVE Sensitive     Inducible Clindamycin NEGATIVE Sensitive     * >=100,000 COLONIES/mL STAPHYLOCOCCUS EPIDERMIDIS  Resp Panel by RT-PCR (Flu A&B, Covid) Nasopharyngeal Swab     Status: None   Collection Time: 11/14/20  9:45 PM   Specimen: Nasopharyngeal Swab; Nasopharyngeal(NP) swabs in vial transport  medium  Result Value Ref Range Status   SARS Coronavirus 2 by RT PCR NEGATIVE NEGATIVE Final    Comment: (NOTE) SARS-CoV-2 target nucleic acids are NOT DETECTED.  The SARS-CoV-2 RNA is generally detectable in upper respiratory specimens during the acute phase of infection. The lowest concentration of SARS-CoV-2 viral copies this assay can detect is 138 copies/mL. A negative result does not preclude SARS-Cov-2 infection and should  not be used as the sole basis for treatment or other patient management decisions. A negative result may occur with  improper specimen collection/handling, submission of specimen other than nasopharyngeal swab, presence of viral mutation(s) within the areas targeted by this assay, and inadequate number of viral copies(<138 copies/mL). A negative result must be combined with clinical observations, patient history, and epidemiological information. The expected result is Negative.  Fact Sheet for Patients:  BloggerCourse.com  Fact Sheet for Healthcare Providers:  SeriousBroker.it  This test is no t yet approved or cleared by the [place] FDA and  has been authorized for detection and/or diagnosis of SARS-CoV-2 by FDA under an Emergency Use Authorization (EUA). This EUA will remain  in effect (meaning this test can be used) for the duration of the COVID-19 declaration under Section 564(b)(1) of the Act, 21 U.S.C.section 360bbb-3(b)(1), unless the authorization is terminated  or revoked sooner.       Influenza A by PCR NEGATIVE NEGATIVE Final   Influenza B by PCR NEGATIVE NEGATIVE Final    Comment: (NOTE) The Xpert Xpress SARS-CoV-2/FLU/RSV plus assay is intended as an aid in the diagnosis of influenza from Nasopharyngeal swab specimens and should not be used as a sole basis for treatment. Nasal washings and aspirates are unacceptable for Xpert Xpress SARS-CoV-2/FLU/RSV testing.  Fact Sheet for  Patients: BloggerCourse.com  Fact Sheet for Healthcare Providers: SeriousBroker.it  This test is not yet approved or cleared by the [place] FDA and has been authorized for detection and/or diagnosis of SARS-CoV-2 by FDA under an Emergency Use Authorization (EUA). This EUA will remain in effect (meaning this test can be used) for the duration of the COVID-19 declaration under Section 564(b)(1) of the Act, 21 U.S.C. section 360bbb-3(b)(1), unless the authorization is terminated or revoked.  Performed at [facility], [address] [place], [place] [address]   Culture, blood (single) w Reflex to ID Panel     Status: None   Collection Time: 11/15/20  1:50 AM   Specimen: BLOOD  Result Value Ref Range Status   Specimen Description BLOOD BLOOD RIGHT HAND  Final   Special Requests   Final    BOTTLES DRAWN AEROBIC AND ANAEROBIC Blood Culture adequate volume   Culture   Final    NO GROWTH 5 DAYS Performed at [facility], [address] [place], [place] [address]    Report Status 11/20/2020 FINAL  Final  MRSA Next Gen by PCR, Nasal     Status: None   Collection Time: 11/15/20  1:54 AM   Specimen: Nasal Mucosa; Nasal Swab  Result Value Ref Range Status   MRSA by PCR Next Gen NOT DETECTED NOT DETECTED Final    Comment: (NOTE) The GeneXpert MRSA Assay (FDA approved for NASAL specimens only), is one component of a comprehensive MRSA colonization surveillance program. It is not intended to diagnose MRSA infection nor to guide or monitor treatment for MRSA infections. Test performance is not FDA approved in patients less than 62 years old. Performed at [facility], [address] [place], [place] [address]   CULTURE, BLOOD (ROUTINE X 2) w Reflex to ID Panel     Status: Abnormal   Collection Time: 11/19/20 10:35 AM   Specimen: BLOOD  Result Value Ref Range Status   Specimen Description   Final     BLOOD BLOOD LEFT HAND Performed at [facility], [address], [place], [place] [address]    Special Requests   Final  BOTTLES DRAWN AEROBIC AND ANAEROBIC Blood Culture adequate volume Performed at [facility], [address] [place], [place] [address]    Culture  Setup Time   Final    GRAM NEGATIVE RODS AEROBIC BOTTLE ONLY CRITICAL RESULT CALLED TO, READ BACK BY AND VERIFIED WITH: JASON ROBBINS AT 0500 11/20/20.PMF Performed at [facility], [address] [place], [place] [address]    Culture PSEUDOMONAS AERUGINOSA (A)  Final   Report Status 11/22/2020 FINAL  Final   Organism ID, Bacteria PSEUDOMONAS AERUGINOSA  Final      Susceptibility   Pseudomonas aeruginosa - MIC*    CEFTAZIDIME 4 SENSITIVE Sensitive     CIPROFLOXACIN <=0.25 SENSITIVE Sensitive     GENTAMICIN <=1 SENSITIVE Sensitive     IMIPENEM 2 SENSITIVE Sensitive     PIP/TAZO 8 SENSITIVE Sensitive     CEFEPIME 2 SENSITIVE Sensitive     * PSEUDOMONAS AERUGINOSA  Blood Culture ID Panel (Reflexed)     Status: Abnormal   Collection Time: 11/19/20 10:35 AM  Result Value Ref Range Status   Enterococcus faecalis NOT DETECTED NOT DETECTED Final   Enterococcus Faecium NOT DETECTED NOT DETECTED Final   Listeria monocytogenes NOT DETECTED NOT DETECTED Final   Staphylococcus species NOT DETECTED NOT DETECTED Final   Staphylococcus aureus (BCID) NOT DETECTED NOT DETECTED Final   Staphylococcus epidermidis NOT DETECTED NOT DETECTED Final   Staphylococcus lugdunensis NOT DETECTED NOT DETECTED Final   Streptococcus species NOT DETECTED NOT DETECTED Final   Streptococcus agalactiae NOT DETECTED NOT DETECTED Final   Streptococcus pneumoniae NOT DETECTED NOT DETECTED Final   Streptococcus pyogenes NOT DETECTED NOT DETECTED Final   A.calcoaceticus-baumannii NOT DETECTED NOT DETECTED Final   Bacteroides fragilis NOT DETECTED NOT DETECTED Final   Enterobacterales NOT DETECTED NOT DETECTED Final    Enterobacter cloacae complex NOT DETECTED NOT DETECTED Final   Escherichia coli NOT DETECTED NOT DETECTED Final   Klebsiella aerogenes NOT DETECTED NOT DETECTED Final   Klebsiella oxytoca NOT DETECTED NOT DETECTED Final   Klebsiella pneumoniae NOT DETECTED NOT DETECTED Final   Proteus species NOT DETECTED NOT DETECTED Final   Salmonella species NOT DETECTED NOT DETECTED Final   Serratia marcescens NOT DETECTED NOT DETECTED Final   Haemophilus influenzae NOT DETECTED NOT DETECTED Final   Neisseria meningitidis NOT DETECTED NOT DETECTED Final   Pseudomonas aeruginosa DETECTED (A) NOT DETECTED Final    Comment: CRITICAL RESULT CALLED TO, READ BACK BY AND VERIFIED WITH: JASON ROBBINS AT 0500 11/20/20.PMF    Stenotrophomonas maltophilia NOT DETECTED NOT DETECTED Final   Candida albicans NOT DETECTED NOT DETECTED Final   Candida auris NOT DETECTED NOT DETECTED Final   Candida glabrata NOT DETECTED NOT DETECTED Final   Candida krusei NOT DETECTED NOT DETECTED Final   Candida parapsilosis NOT DETECTED NOT DETECTED Final   Candida tropicalis NOT DETECTED NOT DETECTED Final   Cryptococcus neoformans/gattii NOT DETECTED NOT DETECTED Final   CTX-M ESBL NOT DETECTED NOT DETECTED Final   Carbapenem resistance IMP NOT DETECTED NOT DETECTED Final   Carbapenem resistance KPC NOT DETECTED NOT DETECTED Final   Carbapenem resistance NDM NOT DETECTED NOT DETECTED Final   Carbapenem resistance VIM NOT DETECTED NOT DETECTED Final    Comment: Performed at [facility], [address] [place], [place] [address]  CULTURE, BLOOD (ROUTINE X 2) w Reflex to ID Panel     Status: None   Collection Time: 11/19/20 10:40 AM   Specimen: BLOOD  Result Value Ref  Range Status   Specimen Description BLOOD BLOOD RIGHT WRIST  Final   Special Requests   Final    BOTTLES DRAWN AEROBIC AND ANAEROBIC Blood Culture adequate volume   Culture   Final    NO GROWTH 5 DAYS Performed at [facility], [address] [place], [place] [address]    Report Status 11/24/2020 FINAL  Final         Radiology Studies: No results found.      Scheduled Meds:  budesonide (PULMICORT) nebulizer solution  0.25 mg Nebulization BID   Chlorhexidine Gluconate Cloth  6 each Topical Daily   feeding supplement  237 mL Oral BID BM   pantoprazole (PROTONIX) IV  40 mg Intravenous Q24H   Continuous Infusions:  sodium chloride 250 mL (11/17/20 1235)   cefTAZidime (FORTAZ)  IV 2 g (11/24/20 0544)   dextrose 5 % with KCl 20 mEq / L 20 mEq (11/24/20 0454)   folic acid (FOLVITE) IVPB 1 mg (11/24/20 0856)     LOS: 10 days    Time spent: 25 minutes    Tresa Moore, MD [facility] Pager [phone]  If 7PM-7AM, please contact night-coverage 11/24/2020, 1:57 PM

## 2020-11-24 NOTE — Progress Notes (Signed)
Verified with provider, Cliffton Asters, NP that dobhoff was okay to use for meds.  See that placement was verified by x-ray today after placement.

## 2020-11-24 NOTE — Consult Note (Signed)
Consultation Note Date: 11/24/2020   Patient Name: Dustin Vazquez  DOB: 09-Jun-1955  MRN: [id number]  Age / Sex: 65 y.o., male  PCP: [facility] [place] Referring Physician: Sidney Ace, MD  Reason for Consultation: Establishing goals of care  HPI/Patient Profile: 65 y.o. male  with past medical history of COPD and alcohol abuse admitted on 11/14/2020 with alcohol intoxication and abd pain with UTI and bacteremia. Ongoing encephalopathy and concern for Wernicke's but with minimal improvement in mental status. Unable to take po intake with high aspiration risk given severe lethargy.    Clinical Assessment and Goals of Care: I met today at Mr. Mose's bedside. No family present. Mr. Seraj did look at me and returned my "hi" but after that I received no further verbal response. He would look at me a second but unable to hold gaze. He nods his head back and forth restlessly until finally falling back asleep. Unable to follow any commands.   I called and spoke with son, Nila Nephew. I picked up on conversation left off from Dr. Roderic Palau regarding ongoing encephalopathy and concern for overall poor prognosis. I explained my interaction with his father today. Nila Nephew is concerned that this is still very poor progress. Nila Nephew wants to continue to be hopeful if there is anything we can do to potentially reverse and help his father improve. He is clear on his father's wishes for DNR. He also initially shared that his father would never want a feeding tube - even temporarily but after further discussion that the alternative treatment course would be comfort focused care he agrees with short term trial of feeding tube.Marland Kitchen He does not want long term tube feeding. He wants to give his father a little more time to try and see if he is able to have any further improvement in his mental status. If no improvement or worsening  health over next few days he would consider comfort focused care and hospice.   Nila Nephew expresses faith that God will do what is best for his father. He actually brought up the idea of hospice during our conversation but I do feel it is appropriate to begin discussing this potential option. Nila Nephew reflects that his father's beloved dog died beginning of 10/03/2022 and he worries that he increased his alcohol consumption after this which has now led to his decline now. Overall Nila Nephew has good understanding of his father's condition and potentially poor prognosis.   All questions/concerns addressed. Emotional support provided. Updated Dr. Priscella Mann and RN.   Primary Decision Maker NEXT OF KIN son Nila Nephew    SUMMARY OF RECOMMENDATIONS   - DNR - Ok with temporary feeding tube for now (no PEG) - Watchful waiting for improvement in neurological status with potential transition to comfort care in the near future  Code Status/Advance Care Planning: DNR   Symptom Management:  Per attending  Palliative Prophylaxis:  Aspiration, Bowel Regimen, Delirium Protocol, Frequent Pain Assessment, and Turn Reposition   Prognosis:  Overall prognosis very poor.   Discharge Planning:  To Be Determined      Primary Diagnoses: Present on Admission:  Severe sepsis ([place])  Severe sepsis with septic shock (CODE) ([place])  Bacteremia due to Pseudomonas  Alcoholic cirrhosis of liver without ascites (HCC)  Acute metabolic encephalopathy  Thrombocytopenia (HCC)  Acute urinary retention  Acute lower UTI   I have reviewed the medical record, interviewed the patient and family, and examined the patient. The following aspects are pertinent.  Past Medical History:  Diagnosis Date   COPD (chronic obstructive pulmonary disease) ([place])    Social History   Socioeconomic History   Marital status: Single    Spouse name: Not on file   Number of children: Not on file   Years of education: Not on file   Highest  education level: Not on file  Occupational History   Not on file  Tobacco Use   Smoking status: Never   Smokeless tobacco: Never  Substance and Sexual Activity   Alcohol use: Yes    Comment: ocasionally   Drug use: Not on file   Sexual activity: Not on file  Other Topics Concern   Not on file  Social History Narrative   Not on file   Social Determinants of Health   Financial Resource Strain: Not on file  Food Insecurity: Not on file  Transportation Needs: Not on file  Physical Activity: Not on file  Stress: Not on file  Social Connections: Not on file   History reviewed. No pertinent family history. Scheduled Meds:  budesonide (PULMICORT) nebulizer solution  0.25 mg Nebulization BID   Chlorhexidine Gluconate Cloth  6 each Topical Daily   feeding supplement  237 mL Oral BID BM   pantoprazole (PROTONIX) IV  40 mg Intravenous Q24H   Continuous Infusions:  sodium chloride 250 mL (11/17/20 1235)   cefTAZidime (FORTAZ)  IV 2 g (11/24/20 0544)   dextrose 5 % with KCl 20 mEq / L 20 mEq (44/03/47 4259)   folic acid (FOLVITE) IVPB 1 mg (11/24/20 0856)   PRN Meds:.acetaminophen **OR** acetaminophen, chlordiazePOXIDE, ipratropium-albuterol, magnesium hydroxide, ondansetron **OR** ondansetron (ZOFRAN) IV Allergies  Allergen Reactions   Trazodone Anxiety   Review of Systems  Unable to perform ROS: Acuity of condition   Physical Exam Vitals and nursing note reviewed.  Constitutional:      Appearance: He is cachectic. He is ill-appearing.  Cardiovascular:     Rate and Rhythm: Normal rate.  Pulmonary:     Effort: No tachypnea, accessory muscle usage or respiratory distress.  Abdominal:     General: Abdomen is flat.     Palpations: Abdomen is soft.  Neurological:     Mental Status: He is lethargic, disoriented and confused.    Vital Signs: BP (!) 100/53 (BP Location: Right Arm)   Pulse 80   Temp 98.1 F (36.7 C)   Resp 17   Ht 5' 6"  (5.638 m)   Wt 60.1 kg   SpO2 99%    BMI 21.39 kg/m  Pain Scale: FLACC       SpO2: SpO2: 99 % O2 Device:SpO2: 99 % O2 Flow Rate: .O2 Flow Rate (L/min): 0.5 L/min  IO: Intake/output summary:  Intake/Output Summary (Last 24 hours) at 11/24/2020 1317 Last data filed at 11/24/2020 0533 Gross per 24 hour  Intake 66.4 ml  Output 1950 ml  Net -1883.6 ml    LBM: Last BM Date: 11/23/20 Baseline Weight: Weight: 63.5 kg Most recent weight: Weight: 60.1 kg     Palliative Assessment/Data:   Flowsheet  Rows    Flowsheet Row Most Recent Value  Intake Tab   Referral Department Critical care  Unit at Time of Referral ICU  Palliative Care Primary Diagnosis Sepsis/Infectious Disease  Date Notified 11/16/20  Palliative Care Type New Palliative care  Reason for referral Clarify Goals of Care  Date of Admission 11/14/20  Date first seen by Palliative Care 11/17/20  # of days Palliative referral response time 1 Day(s)  # of days IP prior to Palliative referral 2  Clinical Assessment   Psychosocial & Spiritual Assessment   Palliative Care Outcomes        Time Total: 50 min  Greater than 50%  of this time was spent counseling and coordinating care related to the above assessment and plan.  Signed by: Vinie Sill, NP Palliative Medicine Team Pager # [phone] (M-F 8a-5p) Team Phone # [phone] (Nights/Weekends)

## 2020-11-24 NOTE — Progress Notes (Signed)
Nutrition Follow-up  DOCUMENTATION CODES:   Severe malnutrition in context of social or environmental circumstances  INTERVENTION:   If nasogastric tube placed, recommend:  Osmolite 1.2 _0 /hr- Initiate at 53m/hr and increase by 118mhr q 8 hours until goal rate is reached.   Free water flushes 3055m4 hours to maintain tube patency   Regimen provides 2016kcal/day, 93g/day protein and 1558m86my free water   Pt at high refeed risk; recommend monitor potassium, magnesium and phosphorus labs daily until stable  NUTRITION DIAGNOSIS:   Severe Malnutrition (in the context of social/environmental circumstances) related to  (inadequate energy intake) as evidenced by severe fat depletion, severe muscle depletion. -ongoing   GOAL:   Patient will meet greater than or equal to 90% of their needs -not met   MONITOR:   Diet advancement, Labs, Weight trends, Skin, I & O's  ASSESSMENT:   65 y32. male with hx of COPD brought to ED after pt was found down on the side of the road in the rain. Found to be hypotensive, hypothermic, and with AMS.  Spoke with MD, plan is for nasogastric tube and nutrition support today. Pt is at high refeed risk.   Medications reviewed and include: protonix, ceftazidine, 5% dextrose w/ KCl <Rem<[id number]>_3<\/OVFIEPPIRJJOACZY>_6, folic acid, thiamine   Labs reviewed: Na 132(L), K 3.8 wnl, creat 0.48(L), P 2.3(L)- 9/20 cbgs- 75, 92, 102 x 24 hrs  Diet Order:   Diet Order             Diet NPO time specified  Diet effective now                  EDUCATION NEEDS:   No education needs have been identified at this time  Skin:  Skin Assessment: Reviewed RN Assessment  Last BM:  9/21- type 7  Height:   Ht Readings from Last 1 Encounters:  11/14/20 _2  (1.676 m)    Weight:   Wt Readings from Last 1 Encounters:  11/24/20 60.1 kg    Ideal Body Weight:  64.5 kg  BMI:  Body mass index is 21.39 kg/m.  Estimated Nutritional Needs:   Kcal:   1800-2100kcal/day  Protein:  90-105g/day  Fluid:  1.6-1.9L/day  CaseKoleen Distance RD, LDN Please refer to [facility] RD and/or RD on-call/weekend/after hours pager

## 2020-11-24 NOTE — Progress Notes (Signed)
   Date of Admission:  11/14/2020     ID: Dustin Vazquez is a 65 y.o. male  Active Problems:   Severe sepsis (HCC)   Severe sepsis with septic shock (CODE) (HCC)   Protein-calorie malnutrition, severe   Bacteremia due to Pseudomonas   Alcoholic cirrhosis of liver without ascites (HCC)   Acute metabolic encephalopathy   Thrombocytopenia (HCC)   Acute urinary retention   Hematuria   Acute lower UTI    Subjective: Altered mental status- awake but non verbal Does not follow commands  Medications:   budesonide (PULMICORT) nebulizer solution  0.25 mg Nebulization BID   Chlorhexidine Gluconate Cloth  6 each Topical Daily   feeding supplement  237 mL Oral BID BM   pantoprazole (PROTONIX) IV  40 mg Intravenous Q24H    Objective: Vital signs in last 24 hours: Temp:  [97.3 F (36.3 C)-98.1 F (36.7 C)] 98.1 F (36.7 C) (09/21 1157) Pulse Rate:  [79-92] 80 (09/21 1157) Resp:  [17-18] 17 (09/21 1157) BP: (91-118)/(53-73) 100/53 (09/21 1157) SpO2:  [91 %-99 %] 99 % (09/21 1157) Weight:  [60.1 kg] 60.1 kg (09/21 0500)  PHYSICAL EXAM:  General: awake, does not follow commands emaciated Lungs: Clear to auscultation bilaterally. No Wheezing or Rhonchi. No rales. Heart: Regular rate and rhythm, no murmur, rub or gallop. Abdomen: Soft, non-tender,not distended. Bowel sounds normal. No masses Extremities: atraumatic, no cyanosis. No edema. No clubbing Skin: No rashes or lesions. Or bruising Lymph: Cervical, supraclavicular normal. Neurologic: cannot be assessed  Lab Results Recent Labs    11/22/20 0419 11/23/20 0330 11/24/20 0452  WBC 6.5 7.9  --   HGB 11.1* 11.4*  --   HCT 33.7* 33.9*  --   NA 134* 132*  --   K 3.6 3.8  --   CL 108 107  --   CO2 21* 21*  --   BUN 10 9  --   CREATININE 0.64 0.51* 0.48*   Liver Panel Recent Labs    11/22/20 0419 11/23/20 0330  PROT 5.7*  --   ALBUMIN 2.2* 2.1*  AST 52*  --   ALT 30  --   ALKPHOS 73  --   BILITOT 1.7*  --     Microbiology:  Studies/Results: No results found.   Assessment/Plan: Altered sensorium and was found on the ground outside.  Was hypothermic hypotensive and septic on admission.  Initially thought to have urinary tract infection was treated with antibiotics.  But urine culture only had staph epidermidis. Pulled his Foley catheter out and traumatized his urethra.  Then developed a fever. Pseudomonas bacteremia Likely due to urethral trauma. Was on cefepime which has been changed to ceftazidime because of persisting altered mental status.will give Iv ceftazidime until 11/30/20 Fever and leukocytosis have resolved  Altered mental status  possible Wernicke's encephalopathy versus alcohol withdrawal seizures.hepatic encephalopathy?? Doubt this is meningitis or encephalitis. Neuro seen patient- hypodelirium in DD    COPD    Alcohol abuse with cirrhosis liver  Discussed the management with care team___________________________________________________

## 2020-11-24 NOTE — Progress Notes (Signed)
Pharmacy Antibiotic Note  Dustin Vazquez is a 65 y.o. male with PMH of COPD, tobacco abuse who was admitted on 11/14/2020 with sepsis. Pharmacy has been consulted for ceftazidime dosing. Today is day 5 of pseudomonas coverage.   Pt has been afebrile >48h, WBC 18.2>5.4>7.0>6.5.7.9  Scr stable <1 since 9/14  Pseudomonas growing in 1 of 4 bottles (aerobic) and was shown to be sensitive to ceftazidime and cefepime.   ID is consulted, recommending to continue current regimen   Plan: Ceftazidime   --Will continue ceftazidime 2 gm IV Q8H   Will continue to monitor renal function and adjust as clinically indicated  Height: 5\' 6"  (167.6 cm) Weight: 60.1 kg (132 lb 7.9 oz) IBW/kg (Calculated) : 63.8  Temp (24hrs), Avg:97.9 F (36.6 C), Min:97.3 F (36.3 C), Max:98.7 F (37.1 C)  Recent Labs  Lab 11/19/20 0623 11/20/20 0434 11/21/20 0412 11/22/20 0419 11/23/20 0330 11/24/20 0452  WBC 18.2* 5.4 7.0 6.5 7.9  --   CREATININE 0.91 0.77  0.72 0.57* 0.64 0.51* 0.48*     Estimated Creatinine Clearance: 77.2 mL/min (A) (by C-G formula based on SCr of 0.48 mg/dL (L)).    Allergies  Allergen Reactions   Trazodone Anxiety    Antimicrobials this admission: Ongoing Ceftazidime 9/19>>  Completed Ceftriaxone 9/11>9/12 Azithromycin 9/13>9/16 Amoxicillin-clavulanate 9/13>9/13 Ampicillin-sulbactam 9/14>9/17 Vancomycin 9/16>9/17 Cefepime 9/17>>9/19  Microbiology results:  BCx: pseudomonas 1/4 bottles   UCx: >100,000 CFU staph epi   BCID: pseudomonas detected     MRSA PCR: not detected   Thank you for allowing pharmacy to be a part of this patient's care.  12-22-1995, PharmD Pharmacy Resident  11/24/2020 1:48 PM

## 2020-11-25 DIAGNOSIS — Z515 Encounter for palliative care: Secondary | ICD-10-CM | POA: Diagnosis not present

## 2020-11-25 DIAGNOSIS — G9341 Metabolic encephalopathy: Secondary | ICD-10-CM | POA: Diagnosis not present

## 2020-11-25 DIAGNOSIS — Z7189 Other specified counseling: Secondary | ICD-10-CM | POA: Diagnosis not present

## 2020-11-25 LAB — CBC
HCT: 31.3 % — ABNORMAL LOW (ref 39.0–52.0)
Hemoglobin: 11.4 g/dL — ABNORMAL LOW (ref 13.0–17.0)
MCH: 34.2 pg — ABNORMAL HIGH (ref 26.0–34.0)
MCHC: 36.4 g/dL — ABNORMAL HIGH (ref 30.0–36.0)
MCV: 94 fL (ref 80.0–100.0)
Platelets: 118 10*3/uL — ABNORMAL LOW (ref 150–400)
RBC: 3.33 MIL/uL — ABNORMAL LOW (ref 4.22–5.81)
RDW: 14.7 % (ref 11.5–15.5)
WBC: 8 10*3/uL (ref 4.0–10.5)
nRBC: 0 % (ref 0.0–0.2)

## 2020-11-25 LAB — BASIC METABOLIC PANEL
Anion gap: 2 — ABNORMAL LOW (ref 5–15)
BUN: 7 mg/dL — ABNORMAL LOW (ref 8–23)
CO2: 23 mmol/L (ref 22–32)
Calcium: 7.8 mg/dL — ABNORMAL LOW (ref 8.9–10.3)
Chloride: 98 mmol/L (ref 98–111)
Creatinine, Ser: 0.53 mg/dL — ABNORMAL LOW (ref 0.61–1.24)
GFR, Estimated: >60 mL/min (ref >60)
Glucose, Bld: 98 mg/dL (ref 70–99)
Potassium: 4.1 mmol/L (ref 3.5–5.1)
Sodium: 123 mmol/L — ABNORMAL LOW (ref 135–145)

## 2020-11-25 LAB — GLUCOSE, CAPILLARY
Glucose-Capillary: 100 mg/dL — ABNORMAL HIGH (ref 70–99)
Glucose-Capillary: 101 mg/dL — ABNORMAL HIGH (ref 70–99)
Glucose-Capillary: 102 mg/dL — ABNORMAL HIGH (ref 70–99)
Glucose-Capillary: 103 mg/dL — ABNORMAL HIGH (ref 70–99)
Glucose-Capillary: 105 mg/dL — ABNORMAL HIGH (ref 70–99)
Glucose-Capillary: 111 mg/dL — ABNORMAL HIGH (ref 70–99)
Glucose-Capillary: 98 mg/dL (ref 70–99)

## 2020-11-25 LAB — PHOSPHORUS: Phosphorus: 2.3 mg/dL — ABNORMAL LOW (ref 2.5–4.6)

## 2020-11-25 LAB — MAGNESIUM: Magnesium: 1.4 mg/dL — ABNORMAL LOW (ref 1.7–2.4)

## 2020-11-25 MED ORDER — MAGNESIUM SULFATE 2 GM/50ML IV SOLN
2.0000 g | Freq: Once | INTRAVENOUS | Status: AC
  Administered 2020-11-25: 2 g via INTRAVENOUS
  Filled 2020-11-25: qty 50

## 2020-11-25 MED ORDER — NEPRO/CARBSTEADY PO LIQD
237.0000 mL | Freq: Three times a day (TID) | ORAL | Status: DC
  Administered 2020-11-25 – 2020-12-07 (×32): 237 mL via ORAL

## 2020-11-25 MED ORDER — ADULT MULTIVITAMIN W/MINERALS CH
1.0000 | ORAL_TABLET | Freq: Every day | ORAL | Status: DC
  Administered 2020-11-26 – 2020-12-07 (×12): 1 via ORAL
  Filled 2020-11-25 (×12): qty 1

## 2020-11-25 NOTE — Progress Notes (Signed)
PROGRESS NOTE    AMARR PLINE  BXU:[id number] DOB: [birth year] DOA: 11/14/2020 PCP: [facility]   Brief Narrative:  65 year old male is brought to the hospital after he was found on the side of the road with altered mental status.  Noted to be hypotensive, hypothermic on arrival with elevated creatinine, liver enzymes and lactic acid.  Noted to have urinary tract infection and was admitted to the hospital for septic shock requiring vasopressors.  He was admitted to the ICU for further management.  Overall hemodynamics improved with IV fluids. Transferred to [facility] on 9/14. Found to have 1/4 blood cultures positive for pseudomonas. He has been on IV antibiotics. He has had prolonged hospital course and remains encephalopathic. Neurology consulted for further input. Palliative care following for goals of care.  9/21: Patient remains encephalopathic.  Discussed with RD, plan to place Dobbhoff and initiate nasogastric tube feeds.  9/22: Patient had Dobbhoff in place.  Placement was confirmed on KUB into position for initiated.  This morning patient much more awake.  Dobbhoff was discontinued at some point was laying by the patient's bedside.  As patient is much more awake will reengage SLP for reevaluation.  Patient cleared for dysphagia 1 diet.   Assessment & Plan:   Active Problems:   Severe sepsis (HCC)   Severe sepsis with septic shock (CODE) (HCC)   Protein-calorie malnutrition, severe   Bacteremia due to Pseudomonas   Alcoholic cirrhosis of liver without ascites (HCC)   Acute metabolic encephalopathy   Thrombocytopenia (HCC)   Acute urinary retention   Hematuria   Acute lower UTI  Sepsis with septic shock secondary to urinary tract infection/Pseudomonas bacteremia -He has been weaned off of vasopressors -Initially on Unasyn, subsequently transition to cefepime for Pseudomonas coverage -Based on culture sensitivities, cefepime changed to ceftazidime -Urine culture  positive for staph epidermidis -1 out of 4 blood cultures positive for Pseudomonas Plan: Continue Fortaz for now Infectious disease consulted, recommendations appreciated End date of Elita Quick 9/27   Acute metabolic encephalopathy -Etiology is not entirely clear -May have a component related to underlying infection, although has been afebrile for several days now -He does have a history of alcoholism and may have a component of Wernicke's, he has been receiving high-dose thiamine -Ammonia level normal, TSH normal  -CT head negative x2 -MRI brain unremarkable -EEG without evidence of epileptiform discharges -Overall electrolyte abnormalities have corrected -No further recommendations from neurology standpoint -Encephalopathy improved as of 9/22 -Dobbhoff was placed but was discontinued at some point on 9/21-9/22 Plan: Completed high-dose thiamine therapy Continue p.o. thiamine 100 mg daily SLP reengaged Dysphagia 1 diet Nutrition follow-up Patient may require replacement of the NGT if he is not meeting his nutritional goals   History of alcohol abuse -He has not required any benzodiazepines in several days' -CIWA protocol discontinued   Alcoholic cirrhosis -Continue to follow LFTs -No evidence of ascites at this time   Possible aspiration -Noted to have coughing/rhonchi after taking p.o. meds -Once mental status has improved, can consider swallow evaluation, will keep n.p.o. for now -Chest x-ray with bilateral infiltrates, suspect developing pneumonia -Currently, he is not requiring any oxygen -PCT 1.3 -Continue on IV antibiotics -speech therapy consult   Thrombocytopenia -Likely multifactorial, related to acute infectious process/recent alcohol use/cirrhosis -platelet count has started to trend up -Continue to monitor   Acute kidney injury -Secondary to hypovolemia -Overall renal function has improved with IV hydration   Urinary retention -Noted to have significant  hematuria -Urology  consulted and coud catheter was placed -Recommended to consider removing catheter once patient is more awake and alert   COPD -No wheezing at this time -Continue bronchodilators and inhaled steroids   Goals of care -Patient was living independently in a camper -Discussed CODE STATUS with patient's son who confirmed DNR -He is overall malnourished and may have difficulty making a meaningful recovery -Palliative care consulted to further address goals of care   DVT prophylaxis: SCDs Code Status: DNR Family Communication: None today Disposition Plan: Status is: Inpatient  Remains inpatient appropriate because:Altered mental status and Inpatient level of care appropriate due to severity of illness  Dispo: The patient is from: Home              Anticipated d/c is to:  TBD              Patient currently is not medically stable to d/c.   Difficult to place patient No       Level of care: Progressive Cardiac  Consultants:  Neurology Palliative care Infectious disease  Procedures:  NG tube placement 9/21  Antimicrobials:  Ceftazidime   Subjective: Seen and examined.  Much more awake this morning.  Objective: Vitals:   11/25/20 0419 11/25/20 0500 11/25/20 1000 11/25/20 1141  BP: 102/70  106/67 96/68  Pulse: 87  90 94  Resp: 18  16 17   Temp: 97.9 F (36.6 C)  98 F (36.7 C) 97.6 F (36.4 C)  TempSrc:   Oral   SpO2: 98%  97% 96%  Weight:  59.8 kg    Height:        Intake/Output Summary (Last 24 hours) at 11/25/2020 1257 Last data filed at 11/25/2020 0644 Gross per 24 hour  Intake 840 ml  Output 625 ml  Net 215 ml   Filed Weights   11/23/20 0500 11/24/20 0500 11/25/20 0500  Weight: 59.2 kg 60.1 kg 59.8 kg    Examination:  General exam: No acute distress.  Appears chronically ill Respiratory system: Poor respiratory effort.  Lungs clear.  Normal work of breathing.  Room air Cardiovascular system: S1-S2, RRR, no murmurs, no pedal  edema Gastrointestinal system: Soft, nontender, nondistended, normal bowel sounds Central nervous system: Lethargic but arousable, oriented x1 Extremities: Diffusely decreased power bilateral extremities Skin: No rashes, lesions or ulcers Psychiatry: Mood and affect flattened.  Insight Limited    Data Reviewed: I have personally reviewed following labs and imaging studies  CBC: Recent Labs  Lab 11/20/20 0434 11/21/20 0412 11/22/20 0419 11/23/20 0330 11/25/20 0518  WBC 5.4 7.0 6.5 7.9 8.0  HGB 9.0* 11.1* 11.1* 11.4* 11.4*  HCT 27.3* 32.7* 33.7* 33.9* 31.3*  MCV 102.2* 98.2 97.4 98.3 94.0  PLT 93* 76* 77* 86* 118*   Basic Metabolic Panel: Recent Labs  Lab 11/19/20 0623 11/20/20 0434 11/21/20 0412 11/22/20 0419 11/23/20 0330 11/24/20 0452 11/25/20 0518  NA 148* 142 135 134* 132*  --  123*  K 3.4* 3.7 3.9 3.6 3.8  --  4.1  CL 119* 113* 109 108 107  --  98  CO2 23 23 22  21* 21*  --  23  GLUCOSE 89 108* 104* 97 104*  --  98  BUN 13 13 11 10 9   --  7*  CREATININE 0.91 0.77  0.72 0.57* 0.64 0.51* 0.48* 0.53*  CALCIUM 8.4* 7.7* 7.7* 8.1* 8.0*  --  7.8*  MG  --   --   --   --   --   --  1.4*  PHOS 1.7* 3.2  --   --  2.3*  --  2.3*   GFR: Estimated Creatinine Clearance: 76.8 mL/min (A) (by C-G formula based on SCr of 0.53 mg/dL (L)). Liver Function Tests: Recent Labs  Lab 11/19/20 0623 11/20/20 0434 11/21/20 0412 11/22/20 0419 11/23/20 0330  AST  --   --  67* 52*  --   ALT  --   --  36 30  --   ALKPHOS  --   --  69 73  --   BILITOT  --   --  1.4* 1.7*  --   PROT  --   --  5.4* 5.7*  --   ALBUMIN 2.6* 2.3* 2.3* 2.2* 2.1*   No results for input(s): LIPASE, AMYLASE in the last 168 hours. Recent Labs  Lab 11/20/20 1200  AMMONIA 27   Coagulation Profile: No results for input(s): INR, PROTIME in the last 168 hours. Cardiac Enzymes: No results for input(s): CKTOTAL, CKMB, CKMBINDEX, TROPONINI in the last 168 hours. BNP (last 3 results) No results for input(s):  PROBNP in the last 8760 hours. HbA1C: No results for input(s): HGBA1C in the last 72 hours. CBG: Recent Labs  Lab 11/24/20 2117 11/25/20 0001 11/25/20 0420 11/25/20 0804 11/25/20 1139  GLUCAP 110* 100* 111* 101* 98   Lipid Profile: No results for input(s): CHOL, HDL, LDLCALC, TRIG, CHOLHDL, LDLDIRECT in the last 72 hours. Thyroid Function Tests: No results for input(s): TSH, T4TOTAL, FREET4, T3FREE, THYROIDAB in the last 72 hours. Anemia Panel: Recent Labs    11/23/20 1222  VITAMINB12 1,724*   Sepsis Labs: Recent Labs  Lab 11/20/20 0434  PROCALCITON 1.30    Recent Results (from the past 240 hour(s))  CULTURE, BLOOD (ROUTINE X 2) w Reflex to ID Panel     Status: Abnormal   Collection Time: 11/19/20 10:35 AM   Specimen: BLOOD  Result Value Ref Range Status   Specimen Description   Final    BLOOD BLOOD LEFT HAND Performed at [facility], [address], [place], [place] [address]    Special Requests   Final    BOTTLES DRAWN AEROBIC AND ANAEROBIC Blood Culture adequate volume Performed at [facility], [address], [place], [place] [address]    Culture  Setup Time   Final    GRAM NEGATIVE RODS AEROBIC BOTTLE ONLY CRITICAL RESULT CALLED TO, READ BACK BY AND VERIFIED WITH: JASON ROBBINS AT 0500 11/20/20.PMF Performed at [facility], [address], [place], [place] [address]    Culture PSEUDOMONAS AERUGINOSA (A)  Final   Report Status 11/22/2020 FINAL  Final   Organism ID, Bacteria PSEUDOMONAS AERUGINOSA  Final      Susceptibility   Pseudomonas aeruginosa - MIC*    CEFTAZIDIME 4 SENSITIVE Sensitive     CIPROFLOXACIN <=0.25 SENSITIVE Sensitive     GENTAMICIN <=1 SENSITIVE Sensitive     IMIPENEM 2 SENSITIVE Sensitive     PIP/TAZO 8 SENSITIVE Sensitive     CEFEPIME 2 SENSITIVE Sensitive     * PSEUDOMONAS AERUGINOSA  Blood Culture ID Panel (Reflexed)     Status: Abnormal   Collection Time: 11/19/20 10:35 AM  Result Value Ref Range  Status   Enterococcus faecalis NOT DETECTED NOT DETECTED Final   Enterococcus Faecium NOT DETECTED NOT DETECTED Final   Listeria monocytogenes NOT DETECTED NOT DETECTED Final   Staphylococcus species NOT DETECTED NOT DETECTED Final   Staphylococcus aureus (BCID) NOT DETECTED NOT DETECTED Final  Staphylococcus epidermidis NOT DETECTED NOT DETECTED Final   Staphylococcus lugdunensis NOT DETECTED NOT DETECTED Final   Streptococcus species NOT DETECTED NOT DETECTED Final   Streptococcus agalactiae NOT DETECTED NOT DETECTED Final   Streptococcus pneumoniae NOT DETECTED NOT DETECTED Final   Streptococcus pyogenes NOT DETECTED NOT DETECTED Final   A.calcoaceticus-baumannii NOT DETECTED NOT DETECTED Final   Bacteroides fragilis NOT DETECTED NOT DETECTED Final   Enterobacterales NOT DETECTED NOT DETECTED Final   Enterobacter cloacae complex NOT DETECTED NOT DETECTED Final   Escherichia coli NOT DETECTED NOT DETECTED Final   Klebsiella aerogenes NOT DETECTED NOT DETECTED Final   Klebsiella oxytoca NOT DETECTED NOT DETECTED Final   Klebsiella pneumoniae NOT DETECTED NOT DETECTED Final   Proteus species NOT DETECTED NOT DETECTED Final   Salmonella species NOT DETECTED NOT DETECTED Final   Serratia marcescens NOT DETECTED NOT DETECTED Final   Haemophilus influenzae NOT DETECTED NOT DETECTED Final   Neisseria meningitidis NOT DETECTED NOT DETECTED Final   Pseudomonas aeruginosa DETECTED (A) NOT DETECTED Final    Comment: CRITICAL RESULT CALLED TO, READ BACK BY AND VERIFIED WITH: JASON ROBBINS AT 0500 11/20/20.PMF    Stenotrophomonas maltophilia NOT DETECTED NOT DETECTED Final   Candida albicans NOT DETECTED NOT DETECTED Final   Candida auris NOT DETECTED NOT DETECTED Final   Candida glabrata NOT DETECTED NOT DETECTED Final   Candida krusei NOT DETECTED NOT DETECTED Final   Candida parapsilosis NOT DETECTED NOT DETECTED Final   Candida tropicalis NOT DETECTED NOT DETECTED Final   Cryptococcus  neoformans/gattii NOT DETECTED NOT DETECTED Final   CTX-M ESBL NOT DETECTED NOT DETECTED Final   Carbapenem resistance IMP NOT DETECTED NOT DETECTED Final   Carbapenem resistance KPC NOT DETECTED NOT DETECTED Final   Carbapenem resistance NDM NOT DETECTED NOT DETECTED Final   Carbapenem resistance VIM NOT DETECTED NOT DETECTED Final    Comment: Performed at [facility], [address] [place], [place] [address]  CULTURE, BLOOD (ROUTINE X 2) w Reflex to ID Panel     Status: None   Collection Time: 11/19/20 10:40 AM   Specimen: BLOOD  Result Value Ref Range Status   Specimen Description BLOOD BLOOD RIGHT WRIST  Final   Special Requests   Final    BOTTLES DRAWN AEROBIC AND ANAEROBIC Blood Culture adequate volume   Culture   Final    NO GROWTH 5 DAYS Performed at [facility], [address], [place], [place] [address]    Report Status 11/24/2020 FINAL  Final         Radiology Studies: DG Abd 1 View  Result Date: 11/24/2020 CLINICAL DATA:  Feeding tube placement. EXAM: ABDOMEN - 1 VIEW COMPARISON:  November 18, 2020. FINDINGS: The bowel gas pattern is normal. Distal tip of feeding tube is seen in proximal stomach. No radio-opaque calculi or other significant radiographic abnormality are seen. IMPRESSION: Distal tip of feeding tube seen in proximal stomach. Electronically Signed   By: Lupita Raider M.D.   On: 11/24/2020 16:35        Scheduled Meds:  budesonide (PULMICORT) nebulizer solution  0.25 mg Nebulization BID   Chlorhexidine Gluconate Cloth  6 each Topical Daily   enoxaparin (LOVENOX) injection  40 mg Subcutaneous Q24H   feeding supplement (NEPRO CARB STEADY)  237 mL Oral TID BM   folic acid  1 mg Per Tube Daily   [START ON 11/26/2020] multivitamin with minerals  1 tablet Oral Daily   pantoprazole (PROTONIX) IV  40  mg Intravenous Q24H   thiamine  100 mg Oral Daily   Continuous Infusions:  sodium chloride 250 mL (11/17/20 1235)   cefTAZidime  (FORTAZ)  IV Stopped (11/25/20 0630)     LOS: 11 days    Time spent: 25 minutes    Tresa Moore, MD [facility] Pager [phone]  If 7PM-7AM, please contact night-coverage 11/25/2020, 12:57 PM

## 2020-11-25 NOTE — Progress Notes (Signed)
Palliative:  HPI: 65 y.o. male  with past medical history of COPD and alcohol abuse admitted on 11/14/2020 with alcohol intoxication and abd pain with UTI and bacteremia. Ongoing encephalopathy and concern for Wernicke's but with minimal improvement in mental status. Unable to take po intake with high aspiration risk given severe lethargy.     I met today at Dustin Vazquez's bedside. He is much more alert than he was in previous days! He greets me "good morning" and responds verbally to all my questions and discussion with him. He was difficult to understand at times. Still with underlying confusion (he tells me he is in "[facility])" and when I tell him he is in the hospital he nods his head no. Overall he is pleasant and cooperative but just confused. Feeding tube has already been pulled out. I discussed with SLP as I anticipate he could tolerate modified diet now that he is so alert.   I called and updated his son, Dustin Vazquez. Dustin Vazquez is still very concerned with his father's confusion. I do explain that if he has lost 7-10 days he is expected to have some confusion. However, if his confusion continues to be severe this can impact his improvement and ability to thrive. We discussed barriers to improvement with underlying confusion, fluctuating mental status, and inability to take in adequate nutrition/hydration. Dustin Vazquez understands and is still understandably concerned. Now that Dustin Vazquez is more alert Dustin Vazquez is worried he would be angry about feeding tube but he did not seem to recall. Dustin Vazquez does not seem interested in replacing feeding tube but will need to be readdressed if needed in coming days. For now we will continue to follow Dustin Vazquez lead and follow his improvement as he is able.   All questions/concerns addressed. Emotional support provided.   Exam: Alert, confused, verbal. No distress. Thin, frail. Abd soft. Moves all extremities.   Plan: - DNR - Continue current interventions.   - Call and verify with son desire for replacement of feeding tube if indicated.  - I will return to service for follow up Monday 11/29/20. Dustin Vazquez is aware I will be out. He was given number to nurses station. He is more available after 3pm for updates.   Birney, NP Palliative Medicine Team Pager [phone] (Please see [email or web address] for schedule) Team Phone [phone]    Greater than 50%  of this time was spent counseling and coordinating care related to the above assessment and plan

## 2020-11-25 NOTE — Progress Notes (Signed)
Speech Language Pathology Treatment: Dysphagia  Patient Details Name: Dustin Vazquez MRN: [id number] DOB: 1955/04/07 Today's Date: 11/25/2020 Time: 1110-1150 SLP Time Calculation (min) (ACUTE ONLY): 40 min  Assessment / Plan / Recommendation Clinical Impression  Pt seen for ongoing assessment of swallowing. He is alert, verbally responsive and engaged w/ SLP; HOWEVER, he continues to exhibit declined Cognitive status; encephalopathy and illness. This can impact his overall awareness/timing of swallow and safety during po tasks which increases risk for aspiration, choking. Pt is on RA; wbc wnl. Missing Most Dentition. He required mod-max verbal/visual/tactile cues for follow through w/ tasks during po trials, positioning upright in bed. Noted mostly mumbled speech but was able to given Name and that he lived in [place].  Pt explained general aspiration precautions and agreed verbally to the need for following them especially sitting upright for all oral intake -- supported behind the back more for full upright sitting d/t weakness and inattention. Pt was able to hold Cup to drink/feed himself but required FULL ASSIST w/ all other trials/po's. Mild-Mod, overt clinical s/s of aspiration were noted w/ trials of thin liquids, ice chips. When given trials of Nectar liquids and purees, No immediate, overt clinical s/s of aspiration were noted -- respiratory status remained calm and unlabored, vocal quality gravely but clear b/t trials, no coughing. Oral phase appeared grossly [facility] for bolus management and oral clearing but slight increased A-P transfer time w/ increased texture of puree -- unsure if related to Cognitive decline and inattention to task. Pt appeared improved from eval yesterday. He did NOT have NGT in place; noted NSG notes that it was out.    Pt appears at risk for aspiration but this risk can be reduced w/ a modified consistency diet and when following general aspiration precautions. Pt  will require feeding support.   Recommend continue a Puree(dysphagia level 1) w/ gravies added to moisten foods; Nectar liquids. Recommend general aspiration precautions; Pills CRUSHED in Puree; tray setup and positioning assistance for meals. REFLUX precautions. Support w/ Feeding at meals. ST services will f/u to monitor pt's progress. NSG/MD updated. Precautions posted at bedside.       HPI HPI: Pt is a 65 y.o. male with hx of COPD brought to ED after pt was found down on the side of the road in the rain. Found to be hypotensive, hypothermic, and with AMS.  Per Dietician, pt has Severe Malnutrition.  Current Dxs: Acute metabolic encephalopathy, Sepsis with septic shock secondary to urinary tract infection; possible ETOH withdrawal d/t h/o ETOH abuse, Alcoholic cirrhosis.  CXR: diffuse bilateral interstitial pulmonary opacity and pulmonary  vascular prominence, most consistent with edema. No focal airspace  opacity.  MRI: No evidence of acute intracranial abnormality.  2. Age advanced generalized atrophy.      SLP Plan  Continue with current plan of care      Recommendations for follow up therapy are one component of a multi-disciplinary discharge planning process, led by the attending physician.  Recommendations may be updated based on patient status, additional functional criteria and insurance authorization.    Recommendations  Diet recommendations: Dysphagia 1 (puree);Nectar-thick liquid Liquids provided via: Cup;Straw Medication Administration: Crushed with puree Supervision: Patient able to self feed;Staff to assist with self feeding;Full supervision/cueing for compensatory strategies (can hold cup to drink) Compensations: Minimize environmental distractions;Slow rate;Small sips/bites;Lingual sweep for clearance of pocketing;Follow solids with liquid;Multiple dry swallows after each bite/sip Postural Changes and/or Swallow Maneuvers: Out of bed for meals;Seated upright 90  degrees;Upright 30-60  min after meal                General recommendations:  (Dietician f/u) Oral Care Recommendations: Oral care BID;Oral care before and after PO;Staff/trained caregiver to provide oral care Follow up Recommendations: Skilled Nursing facility SLP Visit Diagnosis: Dysphagia, oropharyngeal phase (R13.12) (Cognitive decline) Plan: Continue with current plan of care       GO                  Jerilynn Som, MS, CCC-SLP Speech Language Pathologist Rehab Services [phone] [facility] 11/25/2020, 11:54 AM

## 2020-11-25 NOTE — Progress Notes (Signed)
Pharmacy Antibiotic Note  NHIA HEAPHY is a 65 y.o. male with PMH of COPD, tobacco abuse who was admitted on 11/14/2020 with sepsis. Pharmacy has been consulted for ceftazidime dosing. Today is day 6 of pseudomonas coverage.   Pt has been afebrile >48h, WBC 18.2>5.4>7.0>6.5>7.9>8.0  Scr stable <1 since 9/14  Pseudomonas growing in 1 of 4 bottles (aerobic) and was shown to be sensitive to ceftazidime and cefepime.   ID is consulted, recommending to continue current regimen until 9/27 for a total of 10 days of therapy   Plan: Ceftazidime   --Will continue ceftazidime 2 gm IV Q8H   Will continue to monitor renal function and adjust as clinically indicated  Height: 5\' 6"  (167.6 cm) Weight: 59.8 kg (131 lb 13.4 oz) IBW/kg (Calculated) : 63.8  Temp (24hrs), Avg:97.8 F (36.6 C), Min:97.6 F (36.4 C), Max:98.1 F (36.7 C)  Recent Labs  Lab 11/19/20 0623 11/20/20 0434 11/21/20 0412 11/22/20 0419 11/23/20 0330 11/24/20 0452 11/25/20 0518  WBC 18.2* 5.4 7.0 6.5 7.9  --   --   CREATININE 0.91 0.77  0.72 0.57* 0.64 0.51* 0.48* 0.53*     Estimated Creatinine Clearance: 76.8 mL/min (A) (by C-G formula based on SCr of 0.53 mg/dL (L)).    Allergies  Allergen Reactions   Trazodone Anxiety    Antimicrobials this admission: Ongoing Ceftazidime 9/19>>  Completed Ceftriaxone 9/11>9/12 Azithromycin 9/13>9/16 Amoxicillin-clavulanate 9/13>9/13 Ampicillin-sulbactam 9/14>9/17 Vancomycin 9/16>9/17 Cefepime 9/17>>9/19  Microbiology results:  BCx: pseudomonas 1/4 bottles Pansensitive   UCx: >100,000 CFU staph epi Resistant to SMX/TMP only  BCID: pseudomonas detected     MRSA PCR: not detected   Thank you for allowing pharmacy to be a part of this patient's care.  12-22-1995, PharmD Pharmacy Resident  11/25/2020 12:22 PM

## 2020-11-25 NOTE — Progress Notes (Signed)
Patient's nasal feeding tube lying beside him in bed. MD at bedside.

## 2020-11-25 NOTE — Progress Notes (Signed)
Nutrition Follow-up  DOCUMENTATION CODES:   Severe malnutrition in context of social or environmental circumstances  INTERVENTION:   Nepro Shake po TID, each supplement provides 425 kcal and 19 grams protein  Magic cup TID with meals, each supplement provides 290 kcal and 9 grams of protein  MVI po daily   Pt at high refeed risk; recommend monitor potassium, magnesium and phosphorus labs daily until stable  NUTRITION DIAGNOSIS:   Severe Malnutrition (in the context of social/environmental circumstances) related to  (inadequate energy intake) as evidenced by severe fat depletion, severe muscle depletion. -ongoing   GOAL:   Patient will meet greater than or equal to 90% of their needs -not met   MONITOR:   PO intake, Supplement acceptance, Labs, Weight trends, Skin, I & O's  ASSESSMENT:   65 y.o. male with hx of COPD brought to ED after pt was found down on the side of the road in the rain. Found to be hypotensive, hypothermic, and with AMS.  Pt s/p NGT placement yesterday, was on tube feeds overnight but then removed his NGT this morning. Pt more alert today, was seen by SLP and placed on a dysphagia 1/nectar thick diet. RD will add supplements and MVI to help pt meet his estimated needs. Pt is currently refeeding. Per chart, pt is down 8lbs(6%) since admission; this is significant.   Medications reviewed and include: lovenox, folic acid, MVI, protonix, thiamine, ceftazidine, Mg sulfate   Labs reviewed: Na 123(L), K 4.1 wnl, creat 0.53(L), P 2.3(L), Mg 1.4(L) Hgb 11.4(L), Hct 31.3(L) cbgs- 100, 111, 101, 98 x 24 hrs  Diet Order:   Diet Order             DIET - DYS 1 Room service appropriate? Yes with Assist; Fluid consistency: Nectar Thick  Diet effective now                  EDUCATION NEEDS:   No education needs have been identified at this time  Skin:  Skin Assessment: Reviewed RN Assessment  Last BM:  9/21- type 7  Height:   Ht Readings from Last 1  Encounters:  11/14/20 _0  (1.676 m)    Weight:   Wt Readings from Last 1 Encounters:  11/25/20 59.8 kg    Ideal Body Weight:  64.5 kg  BMI:  Body mass index is 21.28 kg/m.  Estimated Nutritional Needs:   Kcal:  1800-2100kcal/day  Protein:  90-105g/day  Fluid:  1.6-1.9L/day  Koleen Distance MS, RD, LDN Please refer to [facility] for RD and/or RD on-call/weekend/after hours pager

## 2020-11-26 DIAGNOSIS — G9341 Metabolic encephalopathy: Secondary | ICD-10-CM | POA: Diagnosis not present

## 2020-11-26 LAB — COMPREHENSIVE METABOLIC PANEL
ALT: 22 U/L (ref 0–44)
AST: 39 U/L (ref 15–41)
Albumin: 2 g/dL — ABNORMAL LOW (ref 3.5–5.0)
Alkaline Phosphatase: 78 U/L (ref 38–126)
Anion gap: 3 — ABNORMAL LOW (ref 5–15)
BUN: 8 mg/dL (ref 8–23)
CO2: 21 mmol/L — ABNORMAL LOW (ref 22–32)
Calcium: 8.1 mg/dL — ABNORMAL LOW (ref 8.9–10.3)
Chloride: 102 mmol/L (ref 98–111)
Creatinine, Ser: 0.5 mg/dL — ABNORMAL LOW (ref 0.61–1.24)
GFR, Estimated: >60 mL/min (ref >60)
Glucose, Bld: 98 mg/dL (ref 70–99)
Potassium: 3.9 mmol/L (ref 3.5–5.1)
Sodium: 126 mmol/L — ABNORMAL LOW (ref 135–145)
Total Bilirubin: 1 mg/dL (ref 0.3–1.2)
Total Protein: 6.3 g/dL — ABNORMAL LOW (ref 6.5–8.1)

## 2020-11-26 LAB — PHOSPHORUS: Phosphorus: 2.5 mg/dL (ref 2.5–4.6)

## 2020-11-26 LAB — GLUCOSE, CAPILLARY
Glucose-Capillary: 101 mg/dL — ABNORMAL HIGH (ref 70–99)
Glucose-Capillary: 112 mg/dL — ABNORMAL HIGH (ref 70–99)
Glucose-Capillary: 112 mg/dL — ABNORMAL HIGH (ref 70–99)
Glucose-Capillary: 81 mg/dL (ref 70–99)
Glucose-Capillary: 88 mg/dL (ref 70–99)
Glucose-Capillary: 98 mg/dL (ref 70–99)

## 2020-11-26 LAB — MAGNESIUM: Magnesium: 2 mg/dL (ref 1.7–2.4)

## 2020-11-26 MED ORDER — SODIUM CHLORIDE 0.9 % IV SOLN: INTRAVENOUS | Status: DC

## 2020-11-26 NOTE — Progress Notes (Signed)
Secure message to B. Jon Billings NP, pt BP 82/49 (MAP 59); HR 95, SPO2 97% on RA, RR 16.  BG 103.  Patient is alert to self, and confused. He is conversing with me, but unclear of time, place or situation.  Pt states "something isn't right".  Patient repeatedly states he "had a strange dream"; and asks this RN if I have seen his dog.  Attempted to re-orient patient, and he continues to state "something isn't right".  Patient asks if he can get something to eat.  Offered patient applesauce but he states he does not like it.

## 2020-11-26 NOTE — Progress Notes (Signed)
Eeg done 

## 2020-11-26 NOTE — Progress Notes (Signed)
Pharmacy Antibiotic Note  Dustin Vazquez is a 65 y.o. male with PMH of COPD, tobacco abuse who was admitted on 11/14/2020 with sepsis. Pharmacy has been consulted for ceftazidime dosing. Today is day 7 of pseudomonas coverage.   Pt has been afebrile >48h, WBC WNL  Scr stable <1 since 9/14  Pseudomonas growing in 1 of 4 bottles (aerobic) and was shown to be sensitive to ceftazidime and cefepime.   ID is consulted, recommending to continue current regimen until 9/27 for a total of 10 days of therapy   Plan: Ceftazidime   --Will continue ceftazidime 2 gm IV Q8H   Will continue to monitor renal function and adjust as clinically indicated  Height: 5\' 6"  (167.6 cm) Weight: 59.8 kg (131 lb 13.4 oz) IBW/kg (Calculated) : 63.8  Temp (24hrs), Avg:97.9 F (36.6 C), Min:97.6 F (36.4 C), Max:98.2 F (36.8 C)  Recent Labs  Lab 11/20/20 0434 11/21/20 0412 11/22/20 0419 11/23/20 0330 11/24/20 0452 11/25/20 0518 11/26/20 0105  WBC 5.4 7.0 6.5 7.9  --  8.0  --   CREATININE 0.77  0.72 0.57* 0.64 0.51* 0.48* 0.53* 0.50*     Estimated Creatinine Clearance: 76.8 mL/min (A) (by C-G formula based on SCr of 0.5 mg/dL (L)).    Allergies  Allergen Reactions   Trazodone Anxiety    Antimicrobials this admission: Ongoing Ceftazidime 9/19>>  Completed Ceftriaxone 9/11>9/12 Azithromycin 9/13>9/16 Amoxicillin-clavulanate 9/13>9/13 Ampicillin-sulbactam 9/14>9/17 Vancomycin 9/16>9/17 Cefepime 9/17>>9/19  Microbiology results:  BCx: pseudomonas 1/4 bottles Pansensitive   UCx: >100,000 CFU staph epi Resistant to SMX/TMP only  BCID: pseudomonas detected     MRSA PCR: not detected   Thank you for allowing pharmacy to be a part of this patient's care.  12-22-1995, PharmD Pharmacy Resident  11/26/2020 11:51 AM

## 2020-11-26 NOTE — Progress Notes (Signed)
Patient returned from EEG more lethargic than previous assessment.  BP low 89/61 map 70 CBG WNL . Dr. Georgeann Oppenheim notified no new interventions needed at this time.

## 2020-11-26 NOTE — Progress Notes (Signed)
PROGRESS NOTE    Dustin Vazquez  WPY:[id number] DOB: June 23, 1955 DOA: 11/14/2020 PCP: [facility]   Brief Narrative:  65 year old male is brought to the hospital after he was found on the side of the road with altered mental status.  Noted to be hypotensive, hypothermic on arrival with elevated creatinine, liver enzymes and lactic acid.  Noted to have urinary tract infection and was admitted to the hospital for septic shock requiring vasopressors.  He was admitted to the ICU for further management.  Overall hemodynamics improved with IV fluids. Transferred to [facility] on 9/14. Found to have 1/4 blood cultures positive for pseudomonas. He has been on IV antibiotics. He has had prolonged hospital course and remains encephalopathic. Neurology consulted for further input. Palliative care following for goals of care.  9/21: Patient remains encephalopathic.  Discussed with RD, plan to place Dobbhoff and initiate nasogastric tube feeds.  9/22: Patient had Dobbhoff in place.  Placement was confirmed on KUB into position for initiated.  This morning patient much more awake.  Dobbhoff was discontinued at some point was laying by the patient's bedside.  As patient is much more awake will reengage SLP for reevaluation.  Patient cleared for dysphagia 1 diet.   Assessment & Plan:   Active Problems:   Severe sepsis (HCC)   Severe sepsis with septic shock (CODE) (HCC)   Protein-calorie malnutrition, severe   Bacteremia due to Pseudomonas   Alcoholic cirrhosis of liver without ascites (HCC)   Acute metabolic encephalopathy   Thrombocytopenia (HCC)   Acute urinary retention   Hematuria   Acute lower UTI  Sepsis with septic shock secondary to urinary tract infection/Pseudomonas bacteremia -He has been weaned off of vasopressors -Initially on Unasyn, subsequently transition to cefepime for Pseudomonas coverage -Based on culture sensitivities, cefepime changed to ceftazidime -Urine culture  positive for staph epidermidis -1 out of 4 blood cultures positive for Pseudomonas Plan: Continue Fortaz for now Infectious disease consulted, recommendations appreciated End date of Elita Quick 9/27   Acute metabolic encephalopathy -Etiology is not entirely clear -May have a component related to underlying infection, although has been afebrile for several days now -He does have a history of alcoholism and may have a component of Wernicke's, he has been receiving high-dose thiamine -Ammonia level normal, TSH normal  -CT head negative x2 -MRI brain unremarkable -EEG without evidence of epileptiform discharges -Overall electrolyte abnormalities have corrected -No further recommendations from neurology standpoint -Encephalopathy improved as of 9/22 -Dobbhoff was placed but was discontinued at some point on 9/21-9/22 Plan: Completed high-dose thiamine therapy Continue p.o. thiamine 100 mg daily SLP reengaged Diet advanced to dysphagia 2 Nutrition follow-up Still a risk for replacement of NGT if not meeting nutritional goals   History of alcohol abuse -He has not required any benzodiazepines in several days' -CIWA protocol discontinued   Alcoholic cirrhosis -Continue to follow LFTs -No evidence of ascites at this time   Possible aspiration -Noted to have coughing/rhonchi after taking p.o. meds -Once mental status has improved, can consider swallow evaluation, will keep n.p.o. for now -Chest x-ray with bilateral infiltrates, suspect developing pneumonia -Currently, he is not requiring any oxygen -PCT 1.3 -Continue on IV antibiotics -speech therapy consult   Thrombocytopenia -Likely multifactorial, related to acute infectious process/recent alcohol use/cirrhosis -platelet count has started to trend up -Continue to monitor   Acute kidney injury -Secondary to hypovolemia -Overall renal function has improved with IV hydration   Urinary retention -Noted to have significant  hematuria -Urology consulted  and coud catheter was placed -Recommended to consider removing catheter once patient is more awake and alert   COPD -No wheezing at this time -Continue bronchodilators and inhaled steroids   Goals of care -Patient was living independently in a camper -Discussed CODE STATUS with patient's son who confirmed DNR -He is overall malnourished and may have difficulty making a meaningful recovery -Palliative care consulted to further address goals of care   DVT prophylaxis: SCDs Code Status: DNR Family Communication: None today Disposition Plan: Status is: Inpatient  Remains inpatient appropriate because:Altered mental status and Inpatient level of care appropriate due to severity of illness  Dispo: The patient is from: Home              Anticipated d/c is to:  TBD              Patient currently is not medically stable to d/c.   Difficult to place patient No       Level of care: Progressive Cardiac  Consultants:  Neurology Palliative care Infectious disease  Procedures:  NG tube placement 9/21  Antimicrobials:  Ceftazidime   Subjective: Seen and examined.  Much more awake this morning.  Objective: Vitals:   11/26/20 0500 11/26/20 0720 11/26/20 0752 11/26/20 1104  BP:  103/69  107/71  Pulse:  90  (!) 103  Resp:  17  18  Temp:  98.3 F (36.8 C)  98 F (36.7 C)  TempSrc:  Oral  Oral  SpO2:  97% 95% 99%  Weight: 59.8 kg     Height:        Intake/Output Summary (Last 24 hours) at 11/26/2020 1252 Last data filed at 11/26/2020 1114 Gross per 24 hour  Intake 273.89 ml  Output 1750 ml  Net -1476.11 ml   Filed Weights   11/24/20 0500 11/25/20 0500 11/26/20 0500  Weight: 60.1 kg 59.8 kg 59.8 kg    Examination:  General exam: No acute distress.  Appears chronically ill Respiratory system: Poor respiratory effort.  Lungs clear.  Normal work of breathing.  Room air Cardiovascular system: S1-S2, RRR, no murmurs, no pedal  edema Gastrointestinal system: Soft, nontender, nondistended, normal bowel sounds Central nervous system: Lethargic but arousable, oriented x1 Extremities: Diffusely decreased power bilateral extremities Skin: No rashes, lesions or ulcers Psychiatry: Mood and affect flattened.  Insight Limited    Data Reviewed: I have personally reviewed following labs and imaging studies  CBC: Recent Labs  Lab 11/20/20 0434 11/21/20 0412 11/22/20 0419 11/23/20 0330 11/25/20 0518  WBC 5.4 7.0 6.5 7.9 8.0  HGB 9.0* 11.1* 11.1* 11.4* 11.4*  HCT 27.3* 32.7* 33.7* 33.9* 31.3*  MCV 102.2* 98.2 97.4 98.3 94.0  PLT 93* 76* 77* 86* 118*   Basic Metabolic Panel: Recent Labs  Lab 11/20/20 0434 11/21/20 0412 11/22/20 0419 11/23/20 0330 11/24/20 0452 11/25/20 0518 11/26/20 0105  NA 142 135 134* 132*  --  123* 126*  K 3.7 3.9 3.6 3.8  --  4.1 3.9  CL 113* 109 108 107  --  98 102  CO2 23 22 21* 21*  --  23 21*  GLUCOSE 108* 104* 97 104*  --  98 98  BUN 13 11 10 9   --  7* 8  CREATININE 0.77  0.72 0.57* 0.64 0.51* 0.48* 0.53* 0.50*  CALCIUM 7.7* 7.7* 8.1* 8.0*  --  7.8* 8.1*  MG  --   --   --   --   --  1.4* 2.0  PHOS 3.2  --   --  2.3*  --  2.3* 2.5   GFR: Estimated Creatinine Clearance: 76.8 mL/min (A) (by C-G formula based on SCr of 0.5 mg/dL (L)). Liver Function Tests: Recent Labs  Lab 11/20/20 0434 11/21/20 0412 11/22/20 0419 11/23/20 0330 11/26/20 0105  AST  --  67* 52*  --  39  ALT  --  36 30  --  22  ALKPHOS  --  69 73  --  78  BILITOT  --  1.4* 1.7*  --  1.0  PROT  --  5.4* 5.7*  --  6.3*  ALBUMIN 2.3* 2.3* 2.2* 2.1* 2.0*   No results for input(s): LIPASE, AMYLASE in the last 168 hours. Recent Labs  Lab 11/20/20 1200  AMMONIA 27   Coagulation Profile: No results for input(s): INR, PROTIME in the last 168 hours. Cardiac Enzymes: No results for input(s): CKTOTAL, CKMB, CKMBINDEX, TROPONINI in the last 168 hours. BNP (last 3 results) No results for input(s): PROBNP in  the last 8760 hours. HbA1C: No results for input(s): HGBA1C in the last 72 hours. CBG: Recent Labs  Lab 11/25/20 2048 11/25/20 2353 11/26/20 0446 11/26/20 0721 11/26/20 1101  GLUCAP 103* 105* 112* 101* 112*   Lipid Profile: No results for input(s): CHOL, HDL, LDLCALC, TRIG, CHOLHDL, LDLDIRECT in the last 72 hours. Thyroid Function Tests: No results for input(s): TSH, T4TOTAL, FREET4, T3FREE, THYROIDAB in the last 72 hours. Anemia Panel: No results for input(s): VITAMINB12, FOLATE, FERRITIN, TIBC, IRON, RETICCTPCT in the last 72 hours.  Sepsis Labs: Recent Labs  Lab 11/20/20 0434  PROCALCITON 1.30    Recent Results (from the past 240 hour(s))  CULTURE, BLOOD (ROUTINE X 2) w Reflex to ID Panel     Status: Abnormal   Collection Time: 11/19/20 10:35 AM   Specimen: BLOOD  Result Value Ref Range Status   Specimen Description   Final    BLOOD BLOOD LEFT HAND Performed at [facility], [address] [place], [place] [address]    Special Requests   Final    BOTTLES DRAWN AEROBIC AND ANAEROBIC Blood Culture adequate volume Performed at [facility], [address] [place], [place] [address]    Culture  Setup Time   Final    GRAM NEGATIVE RODS AEROBIC BOTTLE ONLY CRITICAL RESULT CALLED TO, READ BACK BY AND VERIFIED WITH: JASON ROBBINS AT 0500 11/20/20.PMF Performed at [facility], [address], [place], [place] [address]    Culture PSEUDOMONAS AERUGINOSA (A)  Final   Report Status 11/22/2020 FINAL  Final   Organism ID, Bacteria PSEUDOMONAS AERUGINOSA  Final      Susceptibility   Pseudomonas aeruginosa - MIC*    CEFTAZIDIME 4 SENSITIVE Sensitive     CIPROFLOXACIN <=0.25 SENSITIVE Sensitive     GENTAMICIN <=1 SENSITIVE Sensitive     IMIPENEM 2 SENSITIVE Sensitive     PIP/TAZO 8 SENSITIVE Sensitive     CEFEPIME 2 SENSITIVE Sensitive     * PSEUDOMONAS AERUGINOSA  Blood Culture ID Panel (Reflexed)     Status: Abnormal   Collection Time:  11/19/20 10:35 AM  Result Value Ref Range Status   Enterococcus faecalis NOT DETECTED NOT DETECTED Final   Enterococcus Faecium NOT DETECTED NOT DETECTED Final   Listeria monocytogenes NOT DETECTED NOT DETECTED Final   Staphylococcus species NOT DETECTED NOT DETECTED Final   Staphylococcus aureus (BCID) NOT DETECTED NOT DETECTED Final   Staphylococcus epidermidis NOT DETECTED NOT DETECTED Final   Staphylococcus lugdunensis NOT DETECTED NOT DETECTED Final  Streptococcus species NOT DETECTED NOT DETECTED Final   Streptococcus agalactiae NOT DETECTED NOT DETECTED Final   Streptococcus pneumoniae NOT DETECTED NOT DETECTED Final   Streptococcus pyogenes NOT DETECTED NOT DETECTED Final   A.calcoaceticus-baumannii NOT DETECTED NOT DETECTED Final   Bacteroides fragilis NOT DETECTED NOT DETECTED Final   Enterobacterales NOT DETECTED NOT DETECTED Final   Enterobacter cloacae complex NOT DETECTED NOT DETECTED Final   Escherichia coli NOT DETECTED NOT DETECTED Final   Klebsiella aerogenes NOT DETECTED NOT DETECTED Final   Klebsiella oxytoca NOT DETECTED NOT DETECTED Final   Klebsiella pneumoniae NOT DETECTED NOT DETECTED Final   Proteus species NOT DETECTED NOT DETECTED Final   Salmonella species NOT DETECTED NOT DETECTED Final   Serratia marcescens NOT DETECTED NOT DETECTED Final   Haemophilus influenzae NOT DETECTED NOT DETECTED Final   Neisseria meningitidis NOT DETECTED NOT DETECTED Final   Pseudomonas aeruginosa DETECTED (A) NOT DETECTED Final    Comment: CRITICAL RESULT CALLED TO, READ BACK BY AND VERIFIED WITH: JASON ROBBINS AT 0500 11/20/20.PMF    Stenotrophomonas maltophilia NOT DETECTED NOT DETECTED Final   Candida albicans NOT DETECTED NOT DETECTED Final   Candida auris NOT DETECTED NOT DETECTED Final   Candida glabrata NOT DETECTED NOT DETECTED Final   Candida krusei NOT DETECTED NOT DETECTED Final   Candida parapsilosis NOT DETECTED NOT DETECTED Final   Candida tropicalis NOT  DETECTED NOT DETECTED Final   Cryptococcus neoformans/gattii NOT DETECTED NOT DETECTED Final   CTX-M ESBL NOT DETECTED NOT DETECTED Final   Carbapenem resistance IMP NOT DETECTED NOT DETECTED Final   Carbapenem resistance KPC NOT DETECTED NOT DETECTED Final   Carbapenem resistance NDM NOT DETECTED NOT DETECTED Final   Carbapenem resistance VIM NOT DETECTED NOT DETECTED Final    Comment: Performed at [facility], [address] [place], [place] [address]  CULTURE, BLOOD (ROUTINE X 2) w Reflex to ID Panel     Status: None   Collection Time: 11/19/20 10:40 AM   Specimen: BLOOD  Result Value Ref Range Status   Specimen Description BLOOD BLOOD RIGHT WRIST  Final   Special Requests   Final    BOTTLES DRAWN AEROBIC AND ANAEROBIC Blood Culture adequate volume   Culture   Final    NO GROWTH 5 DAYS Performed at [facility], [address], [place], [place] [address]    Report Status 11/24/2020 FINAL  Final         Radiology Studies: DG Abd 1 View  Result Date: 11/24/2020 CLINICAL DATA:  Feeding tube placement. EXAM: ABDOMEN - 1 VIEW COMPARISON:  November 18, 2020. FINDINGS: The bowel gas pattern is normal. Distal tip of feeding tube is seen in proximal stomach. No radio-opaque calculi or other significant radiographic abnormality are seen. IMPRESSION: Distal tip of feeding tube seen in proximal stomach. Electronically Signed   By: Lupita Raider M.D.   On: 11/24/2020 16:35        Scheduled Meds:  budesonide (PULMICORT) nebulizer solution  0.25 mg Nebulization BID   Chlorhexidine Gluconate Cloth  6 each Topical Daily   enoxaparin (LOVENOX) injection  40 mg Subcutaneous Q24H   feeding supplement (NEPRO CARB STEADY)  237 mL Oral TID BM   folic acid  1 mg Per Tube Daily   multivitamin with minerals  1 tablet Oral Daily   pantoprazole (PROTONIX) IV  40 mg Intravenous Q24H   thiamine  100 mg Oral Daily   Continuous Infusions:  sodium chloride 250 mL (11/17/20  1235)   sodium chloride 50 mL/hr at 11/26/20 0528   cefTAZidime (FORTAZ)  IV 2 g (11/26/20 0535)     LOS: 12 days    Time spent: 15 minutes    Tresa [place], MD [facility] Pager [phone]  If 7PM-7AM, please contact night-coverage 11/26/2020, 12:52 PM

## 2020-11-26 NOTE — Progress Notes (Signed)
Speech Language Pathology Treatment: Dysphagia  Patient Details Name: Dustin Vazquez MRN: [id number] DOB: 1955-12-25 Today's Date: 11/26/2020 Time: 1020-1100 SLP Time Calculation (min) (ACUTE ONLY): 40 min  Assessment / Plan / Recommendation Clinical Impression  Pt seen for ongoing assessment of swallowing; trials to upgrade diet consistency. He is alert, verbally responsive(mumbled speech) and engaged w/ SLP; HOWEVER, he continues to exhibit declined Cognitive status; encephalopathy. This can impact his overall awareness/timing of swallow and safety during po tasks which increases risk for aspiration, choking. Pt is on RA; wbc wnl. Missing Most Dentition. He required mod verbal/visual/tactile cues for follow through w/ tasks during po trials, positioning upright in bed. Noted mostly mumbled speech; followed some 1-step commands.  Pt explained general aspiration precautions and agreed verbally to the need for following them especially sitting upright for all oral intake -- supported behind the back more for full upright sitting d/t weakness and inattention. Pt was able to hold Cup to drink/feed himself but required FULL ASSIST w/ all other trials/po's. Mild-Mod, overt clinical s/s of aspiration were noted w/ trials of thin liquids -- consistent, delayed Coughing noted during trials of thin liquids. No overall decline in respiratory status post trials. When given trials of Nectar liquids and purees, No overt clinical s/s of aspiration were noted -- respiratory status remained calm and unlabored, vocal quality gravely but clear b/t trials, no coughing. Oral phase appeared grossly [facility] for bolus management and oral clearing of increased textured food trials (MINCED), however, increased mashing/gumming Time and oral phase management noted b/f swallowing/clearing followed. Suspect impact from both Cognitive decline and lacking Dentition for mastication. Pt appeared improved from eval.    Pt appears at risk  for aspiration but this risk can be reduced w/ a modified consistency diet and when following general aspiration precautions. Pt will require feeding support.    Recommend a MINCED foods diet(dysphagia level 2) w/ gravies added to moisten foods; Nectar liquids. Recommend general aspiration precautions; Pills CRUSHED in Puree; tray setup and positioning assistance for meals. REFLUX precautions. Support w/ Feeding at meals. ST services will f/u to monitor pt's progress next week for possible upgrade of diet. MD updated, agreed(also suspecting declined Cognitive status impact). NSG updated. Precautions posted at bedside.     HPI HPI: Pt is a 65 y.o. male with hx of COPD brought to ED after pt was found down on the side of the road in the rain. Found to be hypotensive, hypothermic, and with AMS.  Per Dietician, pt has Severe Malnutrition.  Current Dxs: Acute metabolic encephalopathy, Sepsis with septic shock secondary to urinary tract infection; possible ETOH withdrawal d/t h/o ETOH abuse, Alcoholic cirrhosis.  CXR: diffuse bilateral interstitial pulmonary opacity and pulmonary  vascular prominence, most consistent with edema. No focal airspace  opacity.  MRI: No evidence of acute intracranial abnormality.  2. Age advanced generalized atrophy.      SLP Plan  Continue with current plan of care      Recommendations for follow up therapy are one component of a multi-disciplinary discharge planning process, led by the attending physician.  Recommendations may be updated based on patient status, additional functional criteria and insurance authorization.    Recommendations  Diet recommendations: Dysphagia 2 (fine chop);Nectar-thick liquid Liquids provided via: Cup;Straw Medication Administration: Crushed with puree Supervision: Patient able to self feed;Staff to assist with self feeding;Full supervision/cueing for compensatory strategies Compensations: Minimize environmental distractions;Slow rate;Small  sips/bites;Lingual sweep for clearance of pocketing;Follow solids with liquid;Multiple dry swallows after each bite/sip  Postural Changes and/or Swallow Maneuvers: Out of bed for meals;Seated upright 90 degrees;Upright 30-60 min after meal                General recommendations:  (Dietician f/u) Oral Care Recommendations: Oral care BID;Oral care before and after PO;Staff/trained caregiver to provide oral care Follow up Recommendations: Skilled Nursing facility (TBD) SLP Visit Diagnosis: Dysphagia, oropharyngeal phase (R13.12) (Cognitive decline) Plan: Continue with current plan of care       GO                 Jerilynn Som, MS, CCC-SLP Speech Language Pathologist Rehab Services [phone] [facility] 11/26/2020, 4:34 PM

## 2020-11-26 NOTE — Progress Notes (Signed)
Patient requested something to eat.  Provided magic cups, as pt is on pureed diet.  Helped set patient up, and assisted him.

## 2020-11-26 NOTE — Procedures (Signed)
Routine EEG Report  Dustin Vazquez is a 65 y.o. male with a history of encephalopathy who is undergoing an EEG to evaluate for seizures.  Report: This EEG was acquired with electrodes placed according to the International 10-20 electrode system (including Fp1, Fp2, F3, F4, C3, C4, P3, P4, O1, O2, T3, T4, T5, T6, A1, A2, Fz, Cz, Pz). The following electrodes were missing or displaced: none.  The occipital dominant rhythm was 3-5 Hz. This activity is reactive to stimulation. Drowsiness was manifested by background fragmentation; deeper stages of sleep were not identified. There was no focal slowing. There were no interictal epileptiform discharges. There were no electrographic seizures identified. There was no abnormal response to photic stimulation. Hyperventilation was not performed.   Impression and clinical correlation: This EEG was obtained while awake and drowsy and is abnormal due to severe diffuse slowing indicative of global cerebral dysfunction.  Bing Neighbors, MD [facility] [phone]  If 7pm- 7am, please page neurology on call as listed in AMION.

## 2020-11-27 ENCOUNTER — Other Ambulatory Visit: Payer: Self-pay

## 2020-11-27 DIAGNOSIS — G9341 Metabolic encephalopathy: Secondary | ICD-10-CM | POA: Diagnosis not present

## 2020-11-27 LAB — BASIC METABOLIC PANEL
Anion gap: 9 (ref 5–15)
BUN: 7 mg/dL — ABNORMAL LOW (ref 8–23)
CO2: 22 mmol/L (ref 22–32)
Calcium: 8.3 mg/dL — ABNORMAL LOW (ref 8.9–10.3)
Chloride: 104 mmol/L (ref 98–111)
Creatinine, Ser: 0.42 mg/dL — ABNORMAL LOW (ref 0.61–1.24)
GFR, Estimated: >60 mL/min (ref >60)
Glucose, Bld: 84 mg/dL (ref 70–99)
Potassium: 3.8 mmol/L (ref 3.5–5.1)
Sodium: 135 mmol/L (ref 135–145)

## 2020-11-27 LAB — CBC
HCT: 31.7 % — ABNORMAL LOW (ref 39.0–52.0)
Hemoglobin: 10.5 g/dL — ABNORMAL LOW (ref 13.0–17.0)
MCH: 31.6 pg (ref 26.0–34.0)
MCHC: 33.1 g/dL (ref 30.0–36.0)
MCV: 95.5 fL (ref 80.0–100.0)
Platelets: 154 10*3/uL (ref 150–400)
RBC: 3.32 MIL/uL — ABNORMAL LOW (ref 4.22–5.81)
RDW: 15 % (ref 11.5–15.5)
WBC: 5.7 10*3/uL (ref 4.0–10.5)
nRBC: 0 % (ref 0.0–0.2)

## 2020-11-27 LAB — GLUCOSE, CAPILLARY
Glucose-Capillary: 77 mg/dL (ref 70–99)
Glucose-Capillary: 99 mg/dL (ref 70–99)
Glucose-Capillary: 114 mg/dL — ABNORMAL HIGH (ref 70–99)
Glucose-Capillary: 93 mg/dL (ref 70–99)

## 2020-11-27 MED ORDER — ORAL CARE MOUTH RINSE
15.0000 mL | Freq: Two times a day (BID) | OROMUCOSAL | Status: DC
  Administered 2020-11-27 – 2020-12-07 (×20): 15 mL via OROMUCOSAL

## 2020-11-27 MED ORDER — PNEUMOCOCCAL VAC POLYVALENT 25 MCG/0.5ML IJ INJ
0.5000 mL | INJECTION | INTRAMUSCULAR | Status: AC
  Administered 2020-11-28: 0.5 mL via INTRAMUSCULAR
  Filled 2020-11-27: qty 0.5

## 2020-11-27 MED ORDER — INFLUENZA VAC A&B SA ADJ QUAD 0.5 ML IM PRSY
0.5000 mL | PREFILLED_SYRINGE | INTRAMUSCULAR | Status: AC
  Administered 2020-11-28: 0.5 mL via INTRAMUSCULAR
  Filled 2020-11-27 (×3): qty 0.5

## 2020-11-27 NOTE — Progress Notes (Signed)
PROGRESS NOTE    Dustin Vazquez  AOZ:[id number] DOB: 09-Oct-1955 DOA: 11/14/2020 PCP: [facility]   Brief Narrative:  65 year old male is brought to the hospital after he was found on the side of the road with altered mental status.  Noted to be hypotensive, hypothermic on arrival with elevated creatinine, liver enzymes and lactic acid.  Noted to have urinary tract infection and was admitted to the hospital for septic shock requiring vasopressors.  He was admitted to the ICU for further management.  Overall hemodynamics improved with IV fluids. Transferred to [facility] on 9/14. Found to have 1/4 blood cultures positive for pseudomonas. He has been on IV antibiotics. He has had prolonged hospital course and remains encephalopathic. Neurology consulted for further input. Palliative care following for goals of care.  9/21: Patient remains encephalopathic.  Discussed with RD, plan to place Dobbhoff and initiate nasogastric tube feeds.  9/22: Patient had Dobbhoff in place.  Placement was confirmed on KUB into position for initiated.  This morning patient much more awake.  Dobbhoff was discontinued at some point was laying by the patient's bedside.  As patient is much more awake will reengage SLP for reevaluation.  Patient cleared for dysphagia diet.   Assessment & Plan:   Active Problems:   Severe sepsis (HCC)   Severe sepsis with septic shock (CODE) (HCC)   Protein-calorie malnutrition, severe   Bacteremia due to Pseudomonas   Alcoholic cirrhosis of liver without ascites (HCC)   Acute metabolic encephalopathy   Thrombocytopenia (HCC)   Acute urinary retention   Hematuria   Acute lower UTI  Sepsis with septic shock secondary to urinary tract infection/Pseudomonas bacteremia -He has been weaned off of vasopressors -Initially on Unasyn, subsequently transition to cefepime for Pseudomonas coverage -Based on culture sensitivities, cefepime changed to ceftazidime -Urine culture  positive for staph epidermidis -1 out of 4 blood cultures positive for Pseudomonas Plan: Continue Fortaz for now Infectious disease consulted, recommendations appreciated Continue Fortaz until 9/27   Acute metabolic encephalopathy -Etiology is not entirely clear -May have a component related to underlying infection, although has been afebrile for several days now -He does have a history of alcoholism and may have a component of Wernicke's, he has been receiving high-dose thiamine -Ammonia level normal, TSH normal  -CT head negative x2 -MRI brain unremarkable -EEG without evidence of epileptiform discharges -Overall electrolyte abnormalities have corrected -No further recommendations from neurology standpoint -Encephalopathy improved as of 9/22 -Dobbhoff was placed but was discontinued at some point on 9/21-9/22 Plan: Completed high-dose thiamine therapy Continue p.o. thiamine 100 mg daily SLP reengaged Diet advanced to dysphagia 2 Nutrition follow-up Still a risk for replacement of NGT if not meeting nutritional goals Will encourage p.o. intake and monitor for now   History of alcohol abuse -He has not required any benzodiazepines in several days' -CIWA protocol discontinued   Alcoholic cirrhosis -Continue to follow LFTs -No evidence of ascites at this time   Possible aspiration -Noted to have coughing/rhonchi after taking p.o. meds -Once mental status has improved, can consider swallow evaluation, will keep n.p.o. for now -Chest x-ray with bilateral infiltrates, suspect developing pneumonia -Currently, he is not requiring any oxygen -PCT 1.3 -Continue on IV antibiotics -speech therapy consult   Thrombocytopenia -Likely multifactorial, related to acute infectious process/recent alcohol use/cirrhosis -platelet count has started to trend up -Continue to monitor   Acute kidney injury -Secondary to hypovolemia -Overall renal function has improved with IV hydration    Urinary retention -Noted  to have significant hematuria -Urology consulted and coud catheter was placed -Recommended to consider removing catheter once patient is more awake and alert   COPD -No wheezing at this time -Continue bronchodilators and inhaled steroids   Goals of care -Patient was living independently in a camper -Discussed CODE STATUS with patient's son who confirmed DNR -He is overall malnourished and may have difficulty making a meaningful recovery -Palliative care consulted to further address goals of care   DVT prophylaxis: SCDs Code Status: DNR Family Communication: None today Disposition Plan: Status is: Inpatient  Remains inpatient appropriate because:Altered mental status and Inpatient level of care appropriate due to severity of illness  Dispo: The patient is from: Home              Anticipated d/c is to:  TBD              Patient currently is not medically stable to d/c.   Difficult to place patient No       Level of care: Progressive Cardiac  Consultants:  Neurology Palliative care Infectious disease  Procedures:  NG tube placement 9/21  Antimicrobials:  Ceftazidime   Subjective: Seen and examined.  More awake this morning.  Remains somewhat confused.  Objective: Vitals:   11/26/20 2339 11/27/20 0402 11/27/20 0715 11/27/20 1128  BP: 100/60 (!) 112/56  96/72  Pulse: 92 92  100  Resp: 18   16  Temp: 98 F (36.7 C) 98.7 F (37.1 C)  97.6 F (36.4 C)  TempSrc: Oral Oral    SpO2: 98% 98% 92% 100%  Weight:  58.1 kg    Height:        Intake/Output Summary (Last 24 hours) at 11/27/2020 1140 Last data filed at 11/27/2020 0950 Gross per 24 hour  Intake 2533.89 ml  Output 1015 ml  Net 1518.89 ml   Filed Weights   11/25/20 0500 11/26/20 0500 11/27/20 0402  Weight: 59.8 kg 59.8 kg 58.1 kg    Examination:  General exam: No acute distress.  Appears frail and chronically ill Respiratory system: Poor respiratory effort.  Lungs  clear.  Normal work of breathing.  Room air Cardiovascular system: S1-S2, RRR, no murmurs, no pedal edema Gastrointestinal system: Soft, nontender, nondistended, normal bowel sounds Central nervous system: Awake and alert.  Oriented x1 Extremities: Diffusely decreased power bilateral extremities Skin: No rashes, lesions or ulcers Psychiatry: Mood and affect flattened.  Insight Limited    Data Reviewed: I have personally reviewed following labs and imaging studies  CBC: Recent Labs  Lab 11/21/20 0412 11/22/20 0419 11/23/20 0330 11/25/20 0518 11/27/20 0621  WBC 7.0 6.5 7.9 8.0 5.7  HGB 11.1* 11.1* 11.4* 11.4* 10.5*  HCT 32.7* 33.7* 33.9* 31.3* 31.7*  MCV 98.2 97.4 98.3 94.0 95.5  PLT 76* 77* 86* 118* 154   Basic Metabolic Panel: Recent Labs  Lab 11/22/20 0419 11/23/20 0330 11/24/20 0452 11/25/20 0518 11/26/20 0105 11/27/20 0621  NA 134* 132*  --  123* 126* 135  K 3.6 3.8  --  4.1 3.9 3.8  CL 108 107  --  98 102 104  CO2 21* 21*  --  23 21* 22  GLUCOSE 97 104*  --  98 98 84  BUN 10 9  --  7* 8 7*  CREATININE 0.64 0.51* 0.48* 0.53* 0.50* 0.42*  CALCIUM 8.1* 8.0*  --  7.8* 8.1* 8.3*  MG  --   --   --  1.4* 2.0  --   PHOS  --  2.3*  --  2.3* 2.5  --    GFR: Estimated Creatinine Clearance: 74.6 mL/min (A) (by C-G formula based on SCr of 0.42 mg/dL (L)). Liver Function Tests: Recent Labs  Lab 11/21/20 0412 11/22/20 0419 11/23/20 0330 11/26/20 0105  AST 67* 52*  --  39  ALT 36 30  --  22  ALKPHOS 69 73  --  78  BILITOT 1.4* 1.7*  --  1.0  PROT 5.4* 5.7*  --  6.3*  ALBUMIN 2.3* 2.2* 2.1* 2.0*   No results for input(s): LIPASE, AMYLASE in the last 168 hours. Recent Labs  Lab 11/20/20 1200  AMMONIA 27   Coagulation Profile: No results for input(s): INR, PROTIME in the last 168 hours. Cardiac Enzymes: No results for input(s): CKTOTAL, CKMB, CKMBINDEX, TROPONINI in the last 168 hours. BNP (last 3 results) No results for input(s): PROBNP in the last 8760  hours. HbA1C: No results for input(s): HGBA1C in the last 72 hours. CBG: Recent Labs  Lab 11/26/20 1101 11/26/20 1714 11/26/20 1947 11/26/20 2339 11/27/20 0359  GLUCAP 112* 98 81 88 77   Lipid Profile: No results for input(s): CHOL, HDL, LDLCALC, TRIG, CHOLHDL, LDLDIRECT in the last 72 hours. Thyroid Function Tests: No results for input(s): TSH, T4TOTAL, FREET4, T3FREE, THYROIDAB in the last 72 hours. Anemia Panel: No results for input(s): VITAMINB12, FOLATE, FERRITIN, TIBC, IRON, RETICCTPCT in the last 72 hours.  Sepsis Labs: No results for input(s): PROCALCITON, LATICACIDVEN in the last 168 hours.   Recent Results (from the past 240 hour(s))  CULTURE, BLOOD (ROUTINE X 2) w Reflex to ID Panel     Status: Abnormal   Collection Time: 11/19/20 10:35 AM   Specimen: BLOOD  Result Value Ref Range Status   Specimen Description   Final    BLOOD BLOOD LEFT HAND Performed at [facility], [address], [place], [place] [address]    Special Requests   Final    BOTTLES DRAWN AEROBIC AND ANAEROBIC Blood Culture adequate volume Performed at [facility], [address], [place], [place] [address]    Culture  Setup Time   Final    GRAM NEGATIVE RODS AEROBIC BOTTLE ONLY CRITICAL RESULT CALLED TO, READ BACK BY AND VERIFIED WITH: JASON ROBBINS AT 0500 11/20/20.PMF Performed at [facility], [address], [place], [place] [address]    Culture PSEUDOMONAS AERUGINOSA (A)  Final   Report Status 11/22/2020 FINAL  Final   Organism ID, Bacteria PSEUDOMONAS AERUGINOSA  Final      Susceptibility   Pseudomonas aeruginosa - MIC*    CEFTAZIDIME 4 SENSITIVE Sensitive     CIPROFLOXACIN <=0.25 SENSITIVE Sensitive     GENTAMICIN <=1 SENSITIVE Sensitive     IMIPENEM 2 SENSITIVE Sensitive     PIP/TAZO 8 SENSITIVE Sensitive     CEFEPIME 2 SENSITIVE Sensitive     * PSEUDOMONAS AERUGINOSA  Blood Culture ID Panel (Reflexed)     Status: Abnormal   Collection Time:  11/19/20 10:35 AM  Result Value Ref Range Status   Enterococcus faecalis NOT DETECTED NOT DETECTED Final   Enterococcus Faecium NOT DETECTED NOT DETECTED Final   Listeria monocytogenes NOT DETECTED NOT DETECTED Final   Staphylococcus species NOT DETECTED NOT DETECTED Final   Staphylococcus aureus (BCID) NOT DETECTED NOT DETECTED Final   Staphylococcus epidermidis NOT DETECTED NOT DETECTED Final   Staphylococcus lugdunensis NOT DETECTED NOT DETECTED Final   Streptococcus species NOT DETECTED NOT DETECTED Final   Streptococcus agalactiae NOT  DETECTED NOT DETECTED Final   Streptococcus pneumoniae NOT DETECTED NOT DETECTED Final   Streptococcus pyogenes NOT DETECTED NOT DETECTED Final   A.calcoaceticus-baumannii NOT DETECTED NOT DETECTED Final   Bacteroides fragilis NOT DETECTED NOT DETECTED Final   Enterobacterales NOT DETECTED NOT DETECTED Final   Enterobacter cloacae complex NOT DETECTED NOT DETECTED Final   Escherichia coli NOT DETECTED NOT DETECTED Final   Klebsiella aerogenes NOT DETECTED NOT DETECTED Final   Klebsiella oxytoca NOT DETECTED NOT DETECTED Final   Klebsiella pneumoniae NOT DETECTED NOT DETECTED Final   Proteus species NOT DETECTED NOT DETECTED Final   Salmonella species NOT DETECTED NOT DETECTED Final   Serratia marcescens NOT DETECTED NOT DETECTED Final   Haemophilus influenzae NOT DETECTED NOT DETECTED Final   Neisseria meningitidis NOT DETECTED NOT DETECTED Final   Pseudomonas aeruginosa DETECTED (A) NOT DETECTED Final    Comment: CRITICAL RESULT CALLED TO, READ BACK BY AND VERIFIED WITH: JASON ROBBINS AT 0500 11/20/20.PMF    Stenotrophomonas maltophilia NOT DETECTED NOT DETECTED Final   Candida albicans NOT DETECTED NOT DETECTED Final   Candida auris NOT DETECTED NOT DETECTED Final   Candida glabrata NOT DETECTED NOT DETECTED Final   Candida krusei NOT DETECTED NOT DETECTED Final   Candida parapsilosis NOT DETECTED NOT DETECTED Final   Candida tropicalis NOT  DETECTED NOT DETECTED Final   Cryptococcus neoformans/gattii NOT DETECTED NOT DETECTED Final   CTX-M ESBL NOT DETECTED NOT DETECTED Final   Carbapenem resistance IMP NOT DETECTED NOT DETECTED Final   Carbapenem resistance KPC NOT DETECTED NOT DETECTED Final   Carbapenem resistance NDM NOT DETECTED NOT DETECTED Final   Carbapenem resistance VIM NOT DETECTED NOT DETECTED Final    Comment: Performed at [facility], [address] [place], [place] [address]  CULTURE, BLOOD (ROUTINE X 2) w Reflex to ID Panel     Status: None   Collection Time: 11/19/20 10:40 AM   Specimen: BLOOD  Result Value Ref Range Status   Specimen Description BLOOD BLOOD RIGHT WRIST  Final   Special Requests   Final    BOTTLES DRAWN AEROBIC AND ANAEROBIC Blood Culture adequate volume   Culture   Final    NO GROWTH 5 DAYS Performed at [facility], [address], [place], [place] [address]    Report Status 11/24/2020 FINAL  Final         Radiology Studies: EEG adult  Result Date: 14-Dec-2020 Jefferson Fuel, MD     14-Dec-2020  6:07 PM Routine EEG Report VONTE ROSSIN is a 66 y.o. male with a history of encephalopathy who is undergoing an EEG to evaluate for seizures. Report: This EEG was acquired with electrodes placed according to the International 10-20 electrode system (including Fp1, Fp2, F3, F4, C3, C4, P3, P4, O1, O2, T3, T4, T5, T6, A1, A2, Fz, Cz, Pz). The following electrodes were missing or displaced: none. The occipital dominant rhythm was 3-5 Hz. This activity is reactive to stimulation. Drowsiness was manifested by background fragmentation; deeper stages of sleep were not identified. There was no focal slowing. There were no interictal epileptiform discharges. There were no electrographic seizures identified. There was no abnormal response to photic stimulation. Hyperventilation was not performed. Impression and clinical correlation: This EEG was obtained while awake and drowsy and  is abnormal due to severe diffuse slowing indicative of global cerebral dysfunction. Bing Neighbors, MD [facility] [phone] If 7pm- 7am, please page neurology on call as listed in AMION.  Scheduled Meds:  budesonide (PULMICORT) nebulizer solution  0.25 mg Nebulization BID   Chlorhexidine Gluconate Cloth  6 each Topical Daily   enoxaparin (LOVENOX) injection  40 mg Subcutaneous Q24H   feeding supplement (NEPRO CARB STEADY)  237 mL Oral TID BM   folic acid  1 mg Per Tube Daily   [START ON 11/28/2020] influenza vaccine adjuvanted  0.5 mL Intramuscular Tomorrow-1000   mouth rinse  15 mL Mouth Rinse BID   multivitamin with minerals  1 tablet Oral Daily   pantoprazole (PROTONIX) IV  40 mg Intravenous Q24H   [START ON 11/28/2020] pneumococcal 23 valent vaccine  0.5 mL Intramuscular Tomorrow-1000   thiamine  100 mg Oral Daily   Continuous Infusions:  sodium chloride 250 mL (11/17/20 1235)   sodium chloride 50 mL/hr at 11/26/20 2300   cefTAZidime (FORTAZ)  IV 2 g (11/27/20 0445)     LOS: 13 days    Time spent: 25 minutes    Tresa Moore, MD [facility] Pager [phone]  If 7PM-7AM, please contact night-coverage 11/27/2020, 11:40 AM

## 2020-11-28 DIAGNOSIS — G9341 Metabolic encephalopathy: Secondary | ICD-10-CM | POA: Diagnosis not present

## 2020-11-28 LAB — GLUCOSE, CAPILLARY
Glucose-Capillary: 75 mg/dL (ref 70–99)
Glucose-Capillary: 83 mg/dL (ref 70–99)
Glucose-Capillary: 98 mg/dL (ref 70–99)
Glucose-Capillary: 131 mg/dL — ABNORMAL HIGH (ref 70–99)

## 2020-11-28 NOTE — Progress Notes (Signed)
PROGRESS NOTE    Dustin Vazquez  DGU:[id number] DOB: 1955/08/07 DOA: 11/14/2020 PCP: [facility]   Brief Narrative:  65 year old male is brought to the hospital after he was found on the side of the road with altered mental status.  Noted to be hypotensive, hypothermic on arrival with elevated creatinine, liver enzymes and lactic acid.  Noted to have urinary tract infection and was admitted to the hospital for septic shock requiring vasopressors.  He was admitted to the ICU for further management.  Overall hemodynamics improved with IV fluids. Transferred to [facility] on 9/14. Found to have 1/4 blood cultures positive for pseudomonas. He has been on IV antibiotics. He has had prolonged hospital course and remains encephalopathic. Neurology consulted for further input. Palliative care following for goals of care.  9/21: Patient remains encephalopathic.  Discussed with RD, plan to place Dobbhoff and initiate nasogastric tube feeds.  9/22: Patient had Dobbhoff in place.  Placement was confirmed on KUB into position for initiated.  This morning patient much more awake.  Dobbhoff was discontinued at some point was laying by the patient's bedside.  As patient is much more awake will reengage SLP for reevaluation.  Patient cleared for dysphagia diet.   Assessment & Plan:   Active Problems:   Severe sepsis (HCC)   Severe sepsis with septic shock (CODE) (HCC)   Protein-calorie malnutrition, severe   Bacteremia due to Pseudomonas   Alcoholic cirrhosis of liver without ascites (HCC)   Acute metabolic encephalopathy   Thrombocytopenia (HCC)   Acute urinary retention   Hematuria   Acute lower UTI  Sepsis with septic shock secondary to urinary tract infection/Pseudomonas bacteremia -He has been weaned off of vasopressors -Initially on Unasyn, subsequently transition to cefepime for Pseudomonas coverage -Based on culture sensitivities, cefepime changed to ceftazidime -Urine culture  positive for staph epidermidis -1 out of 4 blood cultures positive for Pseudomonas Plan: Continue Fortaz for now Infectious disease consulted, recommendations appreciated Continue Fortaz until 9/27   Acute metabolic encephalopathy -Etiology is not entirely clear -May have a component related to underlying infection, although has been afebrile for several days now -He does have a history of alcoholism and may have a component of Wernicke's, he has been receiving high-dose thiamine -Ammonia level normal, TSH normal  -CT head negative x2 -MRI brain unremarkable -EEG without evidence of epileptiform discharges -Overall electrolyte abnormalities have corrected -No further recommendations from neurology standpoint -Encephalopathy improved as of 9/22 -Dobbhoff was placed but was discontinued at some point on 9/21-9/22 Plan: Continue p.o. thiamine 100 mg daily Dysphagia 2 diet Nutrition follow-up Still a risk for replacement of NGT if not meeting nutritional goals Will encourage p.o. intake and monitor for now   History of alcohol abuse -He has not required any benzodiazepines in several days' -CIWA protocol discontinued   Alcoholic cirrhosis -Continue to follow LFTs -No evidence of ascites at this time   Possible aspiration -Noted to have coughing/rhonchi after taking p.o. meds -Once mental status has improved, can consider swallow evaluation, will keep n.p.o. for now -Chest x-ray with bilateral infiltrates, suspect developing pneumonia -Currently, he is not requiring any oxygen -PCT 1.3 -Continue on IV antibiotics -speech therapy consult   Thrombocytopenia -Likely multifactorial, related to acute infectious process/recent alcohol use/cirrhosis -platelet count has started to trend up -Continue to monitor   Acute kidney injury -Secondary to hypovolemia -Overall renal function has improved with IV hydration   Urinary retention -Noted to have significant hematuria -Urology  consulted and coud  catheter was placed -Recommended to consider removing catheter once patient is more awake and alert   COPD -No wheezing at this time -Continue bronchodilators and inhaled steroids   Goals of care -Patient was living independently in a camper -Discussed CODE STATUS with patient's son who confirmed DNR -He is overall malnourished and may have difficulty making a meaningful recovery -Palliative care consulted to further address goals of care   DVT prophylaxis: SCDs Code Status: DNR Family Communication: None today Disposition Plan: Status is: Inpatient  Remains inpatient appropriate because:Altered mental status and Inpatient level of care appropriate due to severity of illness  Dispo: The patient is from: Home              Anticipated d/c is to:  TBD              Patient currently is not medically stable to d/c.   Difficult to place patient No       Level of care: Progressive Cardiac  Consultants:  Neurology Palliative care Infectious disease  Procedures:  NG tube placement 9/21  Antimicrobials:  Ceftazidime   Subjective: Seen and examined.  Confused but awake.  Objective: Vitals:   11/28/20 0500 11/28/20 0510 11/28/20 0720 11/28/20 0754  BP:  (!) 130/108  100/62  Pulse:  98  95  Resp:  16  16  Temp:  98.2 F (36.8 C)  97.9 F (36.6 C)  TempSrc:      SpO2:  97% 96% 95%  Weight: 60 kg 61.2 kg    Height:        Intake/Output Summary (Last 24 hours) at 11/28/2020 1149 Last data filed at 11/28/2020 0639 Gross per 24 hour  Intake 390 ml  Output 2100 ml  Net -1710 ml   Filed Weights   11/27/20 0402 11/28/20 0500 11/28/20 0510  Weight: 58.1 kg 60 kg 61.2 kg    Examination:  General exam: No acute distress.  Appears frail and chronically ill Respiratory system: Poor respiratory effort.  Lungs clear.  Normal work of breathing.  Room air Cardiovascular system: S1-S2, RRR, no murmurs, no pedal edema Gastrointestinal system: Soft,  nontender, nondistended, normal bowel sounds Central nervous system: Awake and alert.  Oriented x1 Extremities: Diffusely decreased power bilateral extremities Skin: No rashes, lesions or ulcers Psychiatry: Mood and affect flattened.  Ins[employer]    Data Reviewed: I have personally reviewed following labs and imaging studies  CBC: Recent Labs  Lab 11/22/20 0419 11/23/20 0330 11/25/20 0518 11/27/20 0621  WBC 6.5 7.9 8.0 5.7  HGB 11.1* 11.4* 11.4* 10.5*  HCT 33.7* 33.9* 31.3* 31.7*  MCV 97.4 98.3 94.0 95.5  PLT 77* 86* 118* 154   Basic Metabolic Panel: Recent Labs  Lab 11/22/20 0419 11/23/20 0330 11/24/20 0452 11/25/20 0518 11/26/20 0105 11/27/20 0621  NA 134* 132*  --  123* 126* 135  K 3.6 3.8  --  4.1 3.9 3.8  CL 108 107  --  98 102 104  CO2 21* 21*  --  23 21* 22  GLUCOSE 97 104*  --  98 98 84  BUN 10 9  --  7* 8 7*  CREATININE 0.64 0.51* 0.48* 0.53* 0.50* 0.42*  CALCIUM 8.1* 8.0*  --  7.8* 8.1* 8.3*  MG  --   --   --  1.4* 2.0  --   PHOS  --  2.3*  --  2.3* 2.5  --    GFR: Estimated Creatinine Clearance: 78.6 mL/min (A) (by C-G formula  based on SCr of 0.42 mg/dL (L)). Liver Function Tests: Recent Labs  Lab 11/22/20 0419 11/23/20 0330 11/26/20 0105  AST 52*  --  39  ALT 30  --  22  ALKPHOS 73  --  78  BILITOT 1.7*  --  1.0  PROT 5.7*  --  6.3*  ALBUMIN 2.2* 2.1* 2.0*   No results for input(s): LIPASE, AMYLASE in the last 168 hours. No results for input(s): AMMONIA in the last 168 hours.  Coagulation Profile: No results for input(s): INR, PROTIME in the last 168 hours. Cardiac Enzymes: No results for input(s): CKTOTAL, CKMB, CKMBINDEX, TROPONINI in the last 168 hours. BNP (last 3 results) No results for input(s): PROBNP in the last 8760 hours. HbA1C: No results for input(s): HGBA1C in the last 72 hours. CBG: Recent Labs  Lab 11/27/20 0359 11/27/20 1346 11/27/20 1619 11/27/20 1932 11/28/20 0754  GLUCAP 77 99 114* 93 75   Lipid  Profile: No results for input(s): CHOL, HDL, LDLCALC, TRIG, CHOLHDL, LDLDIRECT in the last 72 hours. Thyroid Function Tests: No results for input(s): TSH, T4TOTAL, FREET4, T3FREE, THYROIDAB in the last 72 hours. Anemia Panel: No results for input(s): VITAMINB12, FOLATE, FERRITIN, TIBC, IRON, RETICCTPCT in the last 72 hours.  Sepsis Labs: No results for input(s): PROCALCITON, LATICACIDVEN in the last 168 hours.   Recent Results (from the past 240 hour(s))  CULTURE, BLOOD (ROUTINE X 2) w Reflex to ID Panel     Status: Abnormal   Collection Time: 11/19/20 10:35 AM   Specimen: BLOOD  Result Value Ref Range Status   Specimen Description   Final    BLOOD BLOOD LEFT HAND Performed at [facility], [address], [place], [place] [address]    Special Requests   Final    BOTTLES DRAWN AEROBIC AND ANAEROBIC Blood Culture adequate volume Performed at [facility], [address], [place], [place] [address]    Culture  Setup Time   Final    GRAM NEGATIVE RODS AEROBIC BOTTLE ONLY CRITICAL RESULT CALLED TO, READ BACK BY AND VERIFIED WITH: JASON ROBBINS AT 0500 11/20/20.PMF Performed at [facility], [address], [place], [place] [address]    Culture PSEUDOMONAS AERUGINOSA (A)  Final   Report Status 11/22/2020 FINAL  Final   Organism ID, Bacteria PSEUDOMONAS AERUGINOSA  Final      Susceptibility   Pseudomonas aeruginosa - MIC*    CEFTAZIDIME 4 SENSITIVE Sensitive     CIPROFLOXACIN <=0.25 SENSITIVE Sensitive     GENTAMICIN <=1 SENSITIVE Sensitive     IMIPENEM 2 SENSITIVE Sensitive     PIP/TAZO 8 SENSITIVE Sensitive     CEFEPIME 2 SENSITIVE Sensitive     * PSEUDOMONAS AERUGINOSA  Blood Culture ID Panel (Reflexed)     Status: Abnormal   Collection Time: 11/19/20 10:35 AM  Result Value Ref Range Status   Enterococcus faecalis NOT DETECTED NOT DETECTED Final   Enterococcus Faecium NOT DETECTED NOT DETECTED Final   Listeria monocytogenes NOT DETECTED NOT  DETECTED Final   Staphylococcus species NOT DETECTED NOT DETECTED Final   Staphylococcus aureus (BCID) NOT DETECTED NOT DETECTED Final   Staphylococcus epidermidis NOT DETECTED NOT DETECTED Final   Staphylococcus lugdunensis NOT DETECTED NOT DETECTED Final   Streptococcus species NOT DETECTED NOT DETECTED Final   Streptococcus agalactiae NOT DETECTED NOT DETECTED Final   Streptococcus pneumoniae NOT DETECTED NOT DETECTED Final   Streptococcus pyogenes NOT DETECTED NOT DETECTED Final   A.calcoaceticus-baumannii NOT DETECTED NOT DETECTED  Final   Bacteroides fragilis NOT DETECTED NOT DETECTED Final   Enterobacterales NOT DETECTED NOT DETECTED Final   Enterobacter cloacae complex NOT DETECTED NOT DETECTED Final   Escherichia coli NOT DETECTED NOT DETECTED Final   Klebsiella aerogenes NOT DETECTED NOT DETECTED Final   Klebsiella oxytoca NOT DETECTED NOT DETECTED Final   Klebsiella pneumoniae NOT DETECTED NOT DETECTED Final   Proteus species NOT DETECTED NOT DETECTED Final   Salmonella species NOT DETECTED NOT DETECTED Final   Serratia marcescens NOT DETECTED NOT DETECTED Final   Haemophilus influenzae NOT DETECTED NOT DETECTED Final   Neisseria meningitidis NOT DETECTED NOT DETECTED Final   Pseudomonas aeruginosa DETECTED (A) NOT DETECTED Final    Comment: CRITICAL RESULT CALLED TO, READ BACK BY AND VERIFIED WITH: JASON ROBBINS AT 0500 11/20/20.PMF    Stenotrophomonas maltophilia NOT DETECTED NOT DETECTED Final   Candida albicans NOT DETECTED NOT DETECTED Final   Candida auris NOT DETECTED NOT DETECTED Final   Candida glabrata NOT DETECTED NOT DETECTED Final   Candida krusei NOT DETECTED NOT DETECTED Final   Candida parapsilosis NOT DETECTED NOT DETECTED Final   Candida tropicalis NOT DETECTED NOT DETECTED Final   Cryptococcus neoformans/gattii NOT DETECTED NOT DETECTED Final   CTX-M ESBL NOT DETECTED NOT DETECTED Final   Carbapenem resistance IMP NOT DETECTED NOT DETECTED Final    Carbapenem resistance KPC NOT DETECTED NOT DETECTED Final   Carbapenem resistance NDM NOT DETECTED NOT DETECTED Final   Carbapenem resistance VIM NOT DETECTED NOT DETECTED Final    Comment: Performed at [facility], [address] [place], [place] [address]  CULTURE, BLOOD (ROUTINE X 2) w Reflex to ID Panel     Status: None   Collection Time: 11/19/20 10:40 AM   Specimen: BLOOD  Result Value Ref Range Status   Specimen Description BLOOD BLOOD RIGHT WRIST  Final   Special Requests   Final    BOTTLES DRAWN AEROBIC AND ANAEROBIC Blood Culture adequate volume   Culture   Final    NO GROWTH 5 DAYS Performed at [facility], [address] [place], [place] [address]    Report Status 11/24/2020 FINAL  Final         Radiology Studies: EEG adult  Result Date: 12-17-2020 Jefferson Fuel, MD     Dec 17, 2020  6:07 PM Routine EEG Report Dustin Vazquez is a 65 y.o. male with a history of encephalopathy who is undergoing an EEG to evaluate for seizures. Report: This EEG was acquired with electrodes placed according to the International 10-20 electrode system (including Fp1, Fp2, F3, F4, C3, C4, P3, P4, O1, O2, T3, T4, T5, T6, A1, A2, Fz, Cz, Pz). The following electrodes were missing or displaced: none. The occipital dominant rhythm was 3-5 Hz. This activity is reactive to stimulation. Drowsiness was manifested by background fragmentation; deeper stages of sleep were not identified. There was no focal slowing. There were no interictal epileptiform discharges. There were no electrographic seizures identified. There was no abnormal response to photic stimulation. Hyperventilation was not performed. Impression and clinical correlation: This EEG was obtained while awake and drowsy and is abnormal due to severe diffuse slowing indicative of global cerebral dysfunction. Bing Neighbors, MD [facility] [phone] If 7pm- 7am, please page neurology on call as listed in AMION.         Scheduled Meds:  budesonide (PULMICORT) nebulizer solution  0.25 mg Nebulization BID   Chlorhexidine Gluconate Cloth  6 each Topical Daily   enoxaparin (  LOVENOX) injection  40 mg Subcutaneous Q24H   feeding supplement (NEPRO CARB STEADY)  237 mL Oral TID BM   folic acid  1 mg Per Tube Daily   influenza vaccine adjuvanted  0.5 mL Intramuscular Tomorrow-1000   mouth rinse  15 mL Mouth Rinse BID   multivitamin with minerals  1 tablet Oral Daily   pantoprazole (PROTONIX) IV  40 mg Intravenous Q24H   thiamine  100 mg Oral Daily   Continuous Infusions:  sodium chloride 250 mL (11/17/20 1235)   cefTAZidime (FORTAZ)  IV 2 g (11/28/20 0539)     LOS: 14 days    Time spent: 15 minutes    Tresa Moore, MD [facility] Pager [phone]  If 7PM-7AM, please contact night-coverage 11/28/2020, 11:49 AM

## 2020-11-29 ENCOUNTER — Inpatient Hospital Stay: Payer: Medicare Other

## 2020-11-29 DIAGNOSIS — I4891 Unspecified atrial fibrillation: Secondary | ICD-10-CM | POA: Diagnosis not present

## 2020-11-29 DIAGNOSIS — G9341 Metabolic encephalopathy: Secondary | ICD-10-CM | POA: Diagnosis not present

## 2020-11-29 DIAGNOSIS — R7881 Bacteremia: Secondary | ICD-10-CM | POA: Diagnosis not present

## 2020-11-29 DIAGNOSIS — A419 Sepsis, unspecified organism: Secondary | ICD-10-CM | POA: Diagnosis not present

## 2020-11-29 DIAGNOSIS — B965 Pseudomonas (aeruginosa) (mallei) (pseudomallei) as the cause of diseases classified elsewhere: Secondary | ICD-10-CM | POA: Diagnosis not present

## 2020-11-29 DIAGNOSIS — R6521 Severe sepsis with septic shock: Secondary | ICD-10-CM | POA: Diagnosis not present

## 2020-11-29 LAB — CBC WITH DIFFERENTIAL/PLATELET
Abs Immature Granulocytes: 0.05 10*3/uL (ref 0.00–0.07)
Basophils Absolute: 0.1 10*3/uL (ref 0.0–0.1)
Basophils Relative: 1 %
Eosinophils Absolute: 0.1 10*3/uL (ref 0.0–0.5)
Eosinophils Relative: 1 %
HCT: 29.8 % — ABNORMAL LOW (ref 39.0–52.0)
Hemoglobin: 9.7 g/dL — ABNORMAL LOW (ref 13.0–17.0)
Immature Granulocytes: 1 %
Lymphocytes Relative: 16 %
Lymphs Abs: 1.4 10*3/uL (ref 0.7–4.0)
MCH: 32.2 pg (ref 26.0–34.0)
MCHC: 32.6 g/dL (ref 30.0–36.0)
MCV: 99 fL (ref 80.0–100.0)
Monocytes Absolute: 1.1 10*3/uL — ABNORMAL HIGH (ref 0.1–1.0)
Monocytes Relative: 13 %
Neutro Abs: 5.8 10*3/uL (ref 1.7–7.7)
Neutrophils Relative %: 68 %
Platelets: 124 10*3/uL — ABNORMAL LOW (ref 150–400)
RBC: 3.01 MIL/uL — ABNORMAL LOW (ref 4.22–5.81)
RDW: 15.3 % (ref 11.5–15.5)
WBC: 8.5 10*3/uL (ref 4.0–10.5)
nRBC: 0 % (ref 0.0–0.2)

## 2020-11-29 LAB — BASIC METABOLIC PANEL
Anion gap: 5 (ref 5–15)
BUN: 7 mg/dL — ABNORMAL LOW (ref 8–23)
CO2: 21 mmol/L — ABNORMAL LOW (ref 22–32)
Calcium: 8.1 mg/dL — ABNORMAL LOW (ref 8.9–10.3)
Chloride: 110 mmol/L (ref 98–111)
Creatinine, Ser: 0.49 mg/dL — ABNORMAL LOW (ref 0.61–1.24)
GFR, Estimated: >60 mL/min (ref >60)
Glucose, Bld: 118 mg/dL — ABNORMAL HIGH (ref 70–99)
Potassium: 4 mmol/L (ref 3.5–5.1)
Sodium: 136 mmol/L (ref 135–145)

## 2020-11-29 LAB — GLUCOSE, CAPILLARY
Glucose-Capillary: 102 mg/dL — ABNORMAL HIGH (ref 70–99)
Glucose-Capillary: 105 mg/dL — ABNORMAL HIGH (ref 70–99)
Glucose-Capillary: 110 mg/dL — ABNORMAL HIGH (ref 70–99)
Glucose-Capillary: 111 mg/dL — ABNORMAL HIGH (ref 70–99)
Glucose-Capillary: 97 mg/dL (ref 70–99)

## 2020-11-29 LAB — PROCALCITONIN: Procalcitonin: 0.31 ng/mL

## 2020-11-29 MED ORDER — DIGOXIN 0.25 MG/ML IJ SOLN
0.2500 mg | Freq: Once | INTRAMUSCULAR | Status: AC
  Administered 2020-11-29: 0.25 mg via INTRAVENOUS
  Filled 2020-11-29: qty 2

## 2020-11-29 MED ORDER — AMIODARONE LOAD VIA INFUSION
150.0000 mg | Freq: Once | INTRAVENOUS | Status: AC
  Administered 2020-11-29: 150 mg via INTRAVENOUS
  Filled 2020-11-29: qty 83.34

## 2020-11-29 MED ORDER — SODIUM CHLORIDE 0.9 % IV SOLN: INTRAVENOUS | Status: DC

## 2020-11-29 MED ORDER — AMIODARONE HCL IN DEXTROSE 360-4.14 MG/200ML-% IV SOLN
60.0000 mg/h | INTRAVENOUS | Status: DC
  Administered 2020-11-29: 60 mg/h via INTRAVENOUS
  Filled 2020-11-29 (×3): qty 200

## 2020-11-29 MED ORDER — SODIUM CHLORIDE 0.9 % IV BOLUS
250.0000 mL | Freq: Once | INTRAVENOUS | Status: AC
  Administered 2020-11-29: 250 mL via INTRAVENOUS

## 2020-11-29 MED ORDER — SODIUM CHLORIDE 0.9 % IV BOLUS: 1000.0000 mL | Freq: Once | INTRAVENOUS | Status: AC

## 2020-11-29 MED ORDER — SODIUM CHLORIDE 0.9 % IV BOLUS
1000.0000 mL | Freq: Once | INTRAVENOUS | Status: AC
  Administered 2020-11-29: 1000 mL via INTRAVENOUS

## 2020-11-29 MED ORDER — AMIODARONE HCL IN DEXTROSE 360-4.14 MG/200ML-% IV SOLN
30.0000 mg/h | INTRAVENOUS | Status: DC
  Administered 2020-11-29 – 2020-12-01 (×5): 30 mg/h via INTRAVENOUS
  Filled 2020-11-29 (×4): qty 200

## 2020-11-29 NOTE — Progress Notes (Signed)
Speech Language Pathology Treatment: Dysphagia  Patient Details Name: Dustin Vazquez MRN: [id number] DOB: Dec 23, 1955 Today's Date: 11/29/2020 Time: 3825-0539 SLP Time Calculation (min) (ACUTE ONLY): 40 min  Assessment / Plan / Recommendation Clinical Impression  Pt seen for ongoing assessment of swallowing; trials to upgrade diet consistency. He is alert, verbally responsive(mumbled speech) and engaged w/ SLP; he continues to exhibit declined Cognitive status; encephalopathy. This can impact his overall awareness/timing of swallow and safety during po tasks which increases risk for aspiration, choking. Pt is on [place] O2 2L; wbc wnl. Missing Most Dentition.  He required min-mod verbal/visual/tactile cues for follow through w/ tasks during po trials, positioning upright in bed. Noted min mumbled speech at times; followed some 1-step commands w/ cues. Could not remember what he had for this Lunch meal.   Pt explained general aspiration precautions and agreed verbally to the need for following them especially sitting upright for all oral intake -- supported behind the back more for full upright sitting d/t weakness and inattention. Pt was able to hold Cup to drink/feed himself but required FULL ASSIST w/ drinking d/t Bilat. Mitts in place. Full Prep. W/ po's of thin liquids and ice chips, no overt clinical s/s of aspiration were noted w/ trials of thin liquids; overall respiratory status remained at his baseline post trials -- he was min SOB/WOB immediately post trials w/ multiple sip drinking(impulsive) Suspect this could be impacted by his "crackly" respirations at Baseline PRIOR to any po's. When given Rest Breaks b/t trials and encouraging small sips Slowly, respiratory status appeared min improved and again, no overt clinical s/s of aspiration were noted; vocal quality gravely but clear b/t trials, no coughing. Oral phase appeared grossly [facility] for bolus management and oral clearing.  Suspect impact from  both Cognitive decline and respiratory status; pt appears at risk for aspiration but this risk can be reduced w/ a modified consistency diet and when following general aspiration precautions. Pt will require feeding support.    Recommend a MINCED foods diet(dysphagia level 2) w/ gravies added to moisten foods; Thin liquids. Recommend general aspiration precautions; Pills CRUSHED in Puree; tray setup and positioning assistance for meals. REFLUX precautions. Support w/ Feeding at meals. ST services will f/u to monitor pt's progress. MD updated, agreed(also suspecting declined Cognitive status impact). NSG updated. Precautions posted at bedside.     HPI HPI: Pt is a 65 y.o. male with hx of COPD brought to ED after pt was found down on the side of the road in the rain. Found to be hypotensive, hypothermic, and with AMS.  Per Dietician, pt has Severe Malnutrition.  Current Dxs: Acute metabolic encephalopathy, Sepsis with septic shock secondary to urinary tract infection; possible ETOH withdrawal d/t h/o ETOH abuse, Alcoholic cirrhosis.  CXR: diffuse bilateral interstitial pulmonary opacity and pulmonary  vascular prominence, most consistent with edema. No focal airspace  opacity.  MRI: No evidence of acute intracranial abnormality.  2. Age advanced generalized atrophy.      SLP Plan  Continue with current plan of care      Recommendations for follow up therapy are one component of a multi-disciplinary discharge planning process, led by the attending physician.  Recommendations may be updated based on patient status, additional functional criteria and insurance authorization.    Recommendations  Diet recommendations: Dysphagia 2 (fine chop);Thin liquid Liquids provided via: Cup;Straw Medication Administration: Crushed with puree (for safer swallowing) Supervision: Patient able to self feed;Staff to assist with self feeding;Full supervision/cueing for compensatory strategies  Compensations: Minimize  environmental distractions;Slow rate;Small sips/bites;Lingual sweep for clearance of pocketing;Follow solids with liquid;Multiple dry swallows after each bite/sip Postural Changes and/or Swallow Maneuvers: Out of bed for meals;Seated upright 90 degrees;Upright 30-60 min after meal                General recommendations:  (Dietician f/u) Oral Care Recommendations: Oral care BID;Oral care before and after PO;Staff/trained caregiver to provide oral care Follow up Recommendations: Skilled Nursing facility (TBD) SLP Visit Diagnosis: Dysphagia, oropharyngeal phase (R13.12) (Cognitive decline) Plan: Continue with current plan of care       GO                 Jerilynn Som, MS, CCC-SLP Speech Language Pathologist [facility] [phone] [facility]  11/29/2020, 2:15 PM

## 2020-11-29 NOTE — Progress Notes (Signed)
On call MD notified of temp 100.7 and HR trending in the 120s. Await reply.

## 2020-11-29 NOTE — Progress Notes (Signed)
PROGRESS NOTE    Dustin Vazquez  DDU:[id number] DOB: Jul 24, 1955 DOA: 11/14/2020 PCP: [facility]   Brief Narrative:  65 year old male is brought to the hospital after he was found on the side of the road with altered mental status.  Noted to be hypotensive, hypothermic on arrival with elevated creatinine, liver enzymes and lactic acid.  Noted to have urinary tract infection and was admitted to the hospital for septic shock requiring vasopressors.  He was admitted to the ICU for further management.  Overall hemodynamics improved with IV fluids. Transferred to [facility] on 9/14. Found to have 1/4 blood cultures positive for pseudomonas. He has been on IV antibiotics. He has had prolonged hospital course and remains encephalopathic. Neurology consulted for further input. Palliative care following for goals of care.  9/21: Patient remains encephalopathic.  Discussed with RD, plan to place Dobbhoff and initiate nasogastric tube feeds.  9/22: Patient had Dobbhoff in place.  Placement was confirmed on KUB into position for initiated.  This morning patient much more awake.  Dobbhoff was discontinued at some point was laying by the patient's bedside.  As patient is much more awake will reengage SLP for reevaluation.  Patient cleared for dysphagia diet.  9/26: Patient was noted to become more tachycardic overnight with a low-grade temperature.  Cross cover informed.  Patient was found to be in rapid atrial fibrillation.  Started on amiodarone gtt. with improvement in rate.   Assessment & Plan:   Active Problems:   Severe sepsis (HCC)   Severe sepsis with septic shock (CODE) (HCC)   Protein-calorie malnutrition, severe   Bacteremia due to Pseudomonas   Alcoholic cirrhosis of liver without ascites (HCC)   Acute metabolic encephalopathy   Thrombocytopenia (HCC)   Acute urinary retention   Hematuria   Acute lower UTI  Sepsis with septic shock secondary to urinary tract  infection/Pseudomonas bacteremia -He has been weaned off of vasopressors -Initially on Unasyn, subsequently transition to cefepime for Pseudomonas coverage -Based on culture sensitivities, cefepime changed to ceftazidime -Urine culture positive for staph epidermidis -1 out of 4 blood cultures positive for Pseudomonas -Breakthrough low-grade temperature of 100.7 -No white count Plan: Continue Fortaz for now Infectious disease consulted, recommendations appreciated Continue Fortaz until 9/27 Recheck chest x-ray Check procalcitonin If any clinical signs of infection may extend antibiotic course   Acute metabolic encephalopathy -Etiology is not entirely clear -May have a component related to underlying infection, although has been afebrile for several days now -He does have a history of alcoholism and may have a component of Wernicke's, he has been receiving high-dose thiamine -Ammonia level normal, TSH normal  -CT head negative x2 -MRI brain unremarkable -EEG without evidence of epileptiform discharges -Overall electrolyte abnormalities have corrected -No further recommendations from neurology standpoint -Encephalopathy improved as of 9/22 -Dobbhoff was placed but was discontinued at some point on 9/21-9/22 Plan: Continue p.o. thiamine 100 mg daily Dysphagia 2 diet Nutrition follow-up Still a risk for replacement of NGT if not meeting nutritional goals Will encourage p.o. intake and monitor for now If encephalopathy remains persistent may need to consider de-escalation of care and comfort measures   History of alcohol abuse -He has not required any benzodiazepines in several days' -CIWA protocol discontinued   Alcoholic cirrhosis -Continue to follow LFTs -No evidence of ascites at this time     Thrombocytopenia -Likely multifactorial, related to acute infectious process/recent alcohol use/cirrhosis -platelet count has started to trend up -Continue to monitor   Acute  kidney injury -Secondary to hypovolemia -Overall renal function has improved with IV hydration   Urinary retention -Noted to have significant hematuria -Urology consulted and coud catheter was placed -Recommended to consider removing catheter once patient is more awake and alert   COPD -No wheezing at this time -Continue bronchodilators and inhaled steroids   Goals of care -Patient was living independently in a camper -Discussed CODE STATUS with patient's son who confirmed DNR -He is overall malnourished and may have difficulty making a meaningful recovery -Palliative care consulted to further address goals of care   DVT prophylaxis: SCDs Code Status: DNR Family Communication: None today Disposition Plan: Status is: Inpatient  Remains inpatient appropriate because:Altered mental status and Inpatient level of care appropriate due to severity of illness  Dispo: The patient is from: Home              Anticipated d/c is to:  TBD              Patient currently is not medically stable to d/c.   Difficult to place patient No       Level of care: Progressive Cardiac  Consultants:  Neurology Palliative care Infectious disease  Procedures:  NG tube placement 9/21  Antimicrobials:  Ceftazidime   Subjective: Seen and examined.  Confused but awake.  Objective: Vitals:   11/29/20 0627 11/29/20 0741 11/29/20 0743 11/29/20 1113  BP: (!) 93/59  (!) 89/53 114/68  Pulse: (!) 149  98 91  Resp: 20  18 17   Temp: 98.9 F (37.2 C)  97.8 F (36.6 C) 98 F (36.7 C)  TempSrc: Oral  Axillary Oral  SpO2: 96% 97% 97% 99%  Weight:      Height:        Intake/Output Summary (Last 24 hours) at 11/29/2020 1404 Last data filed at 11/29/2020 1100 Gross per 24 hour  Intake 525.33 ml  Output 851 ml  Net -325.67 ml   Filed Weights   11/28/20 0500 11/28/20 0510 11/29/20 0300  Weight: 60 kg 61.2 kg 59 kg    Examination:  General exam: Lying in bed.  No acute distress.   Appears frail and chronically ill.  Appears confused Respiratory system: Poor respiratory effort.  Lungs clear.  Normal work of breathing.  Room air Cardiovascular system: S1-S2, RRR, no murmurs, no pedal edema Gastrointestinal system: Soft, nontender, nondistended, normal bowel sounds Central nervous system: Awake and alert.  Oriented x1 Extremities: Diffusely decreased power bilateral extremities Skin: No rashes, lesions or ulcers Psychiatry: Mood and affect flattened.  [employer]    Data Reviewed: I have personally reviewed following labs and imaging studies  CBC: Recent Labs  Lab 11/23/20 0330 11/25/20 0518 11/27/20 0621 11/29/20 1229  WBC 7.9 8.0 5.7 8.5  NEUTROABS  --   --   --  5.8  HGB 11.4* 11.4* 10.5* 9.7*  HCT 33.9* 31.3* 31.7* 29.8*  MCV 98.3 94.0 95.5 99.0  PLT 86* 118* 154 124*   Basic Metabolic Panel: Recent Labs  Lab 11/23/20 0330 11/24/20 0452 11/25/20 0518 11/26/20 0105 11/27/20 0621 11/29/20 1229  NA 132*  --  123* 126* 135 136  K 3.8  --  4.1 3.9 3.8 4.0  CL 107  --  98 102 104 110  CO2 21*  --  23 21* 22 21*  GLUCOSE 104*  --  98 98 84 118*  BUN 9  --  7* 8 7* 7*  CREATININE 0.51* 0.48* 0.53* 0.50* 0.42* 0.49*  CALCIUM 8.0*  --  7.8* 8.1* 8.3* 8.1*  MG  --   --  1.4* 2.0  --   --   PHOS 2.3*  --  2.3* 2.5  --   --    GFR: Estimated Creatinine Clearance: 75.8 mL/min (A) (by C-G formula based on SCr of 0.49 mg/dL (L)). Liver Function Tests: Recent Labs  Lab 11/23/20 0330 11/26/20 0105  AST  --  39  ALT  --  22  ALKPHOS  --  78  BILITOT  --  1.0  PROT  --  6.3*  ALBUMIN 2.1* 2.0*   No results for input(s): LIPASE, AMYLASE in the last 168 hours. No results for input(s): AMMONIA in the last 168 hours.  Coagulation Profile: No results for input(s): INR, PROTIME in the last 168 hours. Cardiac Enzymes: No results for input(s): CKTOTAL, CKMB, CKMBINDEX, TROPONINI in the last 168 hours. BNP (last 3 results) No results for input(s):  PROBNP in the last 8760 hours. HbA1C: No results for input(s): HGBA1C in the last 72 hours. CBG: Recent Labs  Lab 11/28/20 1922 11/29/20 0041 11/29/20 0506 11/29/20 0748 11/29/20 1113  GLUCAP 131* 105* 97 110* 111*   Lipid Profile: No results for input(s): CHOL, HDL, LDLCALC, TRIG, CHOLHDL, LDLDIRECT in the last 72 hours. Thyroid Function Tests: No results for input(s): TSH, T4TOTAL, FREET4, T3FREE, THYROIDAB in the last 72 hours. Anemia Panel: No results for input(s): VITAMINB12, FOLATE, FERRITIN, TIBC, IRON, RETICCTPCT in the last 72 hours.  Sepsis Labs: No results for input(s): PROCALCITON, LATICACIDVEN in the last 168 hours.   No results found for this or any previous visit (from the past 240 hour(s)).        Radiology Studies: No results found.      Scheduled Meds:  budesonide (PULMICORT) nebulizer solution  0.25 mg Nebulization BID   Chlorhexidine Gluconate Cloth  6 each Topical Daily   enoxaparin (LOVENOX) injection  40 mg Subcutaneous Q24H   feeding supplement (NEPRO CARB STEADY)  237 mL Oral TID BM   folic acid  1 mg Per Tube Daily   mouth rinse  15 mL Mouth Rinse BID   multivitamin with minerals  1 tablet Oral Daily   pantoprazole (PROTONIX) IV  40 mg Intravenous Q24H   thiamine  100 mg Oral Daily   Continuous Infusions:  sodium chloride 250 mL (11/17/20 1235)   sodium chloride 100 mL/hr at 11/29/20 0527   amiodarone 30 mg/hr (11/29/20 1348)   cefTAZidime (FORTAZ)  IV 2 g (11/29/20 0527)     LOS: 15 days    Time spent: 25 minutes    Tresa Moore, MD [facility] Pager [phone]  If 7PM-7AM, please contact night-coverage 11/29/2020, 2:04 PM

## 2020-11-29 NOTE — Progress Notes (Signed)
Date of Admission:  11/14/2020     ID: Dustin Vazquez is a 65 y.o. male  Active Problems:   Severe sepsis (HCC)   Severe sepsis with septic shock (CODE) (HCC)   Protein-calorie malnutrition, severe   Bacteremia due to Pseudomonas   Alcoholic cirrhosis of liver without ascites (HCC)   Acute metabolic encephalopathy   Thrombocytopenia (HCC)   Acute urinary retention   Hematuria   Acute lower UTI    Subjective: awake  Medications:   budesonide (PULMICORT) nebulizer solution  0.25 mg Nebulization BID   Chlorhexidine Gluconate Cloth  6 each Topical Daily   enoxaparin (LOVENOX) injection  40 mg Subcutaneous Q24H   feeding supplement (NEPRO CARB STEADY)  237 mL Oral TID BM   folic acid  1 mg Per Tube Daily   mouth rinse  15 mL Mouth Rinse BID   multivitamin with minerals  1 tablet Oral Daily   pantoprazole (PROTONIX) IV  40 mg Intravenous Q24H   thiamine  100 mg Oral Daily    Objective: Vital signs in last 24 hours: Patient Vitals for the past 24 hrs:  BP Temp Temp src Pulse Resp SpO2 Weight  11/29/20 1113 114/68 98 F (36.7 C) Oral 91 17 99 % --  11/29/20 0743 (!) 89/53 97.8 F (36.6 C) Axillary 98 18 97 % --  11/29/20 0741 -- -- -- -- -- 97 % --  11/29/20 0627 (!) 93/59 98.9 F (37.2 C) Oral (!) 149 20 96 % --  11/29/20 0528 91/61 98.8 F (37.1 C) Oral (!) 118 20 97 % --  11/29/20 0441 (!) 80/69 -- -- 73 17 98 % --  11/29/20 0435 95/77 -- -- 85 (!) 21 96 % --  11/29/20 0425 (!) 85/60 100.1 F (37.8 C) Oral (!) 148 (!) 22 97 % --  11/29/20 0336 111/68 (!) 100.7 F (38.2 C) Oral (!) 122 -- 98 % --  11/29/20 0300 -- -- -- -- -- -- 59 kg  11/29/20 0040 122/85 98.1 F (36.7 C) -- (!) 119 18 100 % --  11/28/20 1920 103/72 98.1 F (36.7 C) -- (!) 113 18 97 % --  11/28/20 1555 120/78 98.3 F (36.8 C) Oral (!) 102 18 94 % --     PHYSICAL EXAM:  General: awake, speech incomprehensible, but more alert and trying to respond to commands appropriately Head:  Normocephalic, without obvious abnormality, atraumatic. Eyes: Conjunctivae clear, anicteric sclerae. Pupils are equal Lungs: b/l rhonchi Heart: Regular rate and rhythm, no murmur, rub or gallop. Abdomen: Soft, non-tender,not distended. Bowel sounds normal. No masses Extremities: atraumatic, no cyanosis. No edema. No clubbing Skin: No rashes or lesions. Or bruising Lymph: Cervical, supraclavicular normal. Neurologic: cannot assess  Lab Results CBC Latest Ref Rng & Units 11/29/2020 11/27/2020 11/25/2020  WBC 4.0 - 10.5 K/uL 8.5 5.7 8.0  Hemoglobin 13.0 - 17.0 g/dL 2.9(J) 10.5(L) 11.4(L)  Hematocrit 39.0 - 52.0 % 29.8(L) 31.7(L) 31.3(L)  Platelets 150 - 400 K/uL 124(L) 154 118(L)    Recent Labs    11/27/20 0621 11/29/20 1229  WBC 5.7 8.5  HGB 10.5* 9.7*  HCT 31.7* 29.8*  NA 135 136  K 3.8 4.0  CL 104 110  CO2 22 21*  BUN 7* 7*  CREATININE 0.42* 0.49*   Microbiology:  Studies/Results: No results found.   Assessment/Plan: Acute encephalopathy after being found on the ground outside=hypotensive and hypothermic on presentation Initially thought to have UTI but urine culture Staph epidermidis Urinary retention foley  placed- he pulled foley out causing urethral bleed- Coude placed by urologist Pseudomonas bacteremia likely due to trauma to urethra On ceftazidime until 11/10/20 New fever today- could be from aspiration Observe closely  Altered mental status  possible Wernicke's encephalopathy versus alcohol withdrawal seizures.Doubt this is meningitis or encephalitis. Pt received high dose thiamine   Thrombocytopenia due to alcohol use   COPD    Alcohol abuse with cirrhosis liver   Malnutrition  Discussed the management with the care team

## 2020-11-29 NOTE — Progress Notes (Signed)
Critical care note:  Date of note: 11/29/2020  Subjective: The patient was initially more tachycardic tonight above his usual rate.  Is been between 90 and 110 and his heart rate and tonight it is gotten closer to 110/120.  He has a temperature 100.7 orally for which she was given p.o. Tylenol.  He was also started on IV normal saline at 100 mL/h given diminished p.o. intake the last few days.  It was placed on dysphagia 2 diet with thickened liquids but did not have much interest or cooperation with food/fluids.  His heart rate later on went up to 140-150 with bursts of SVT up to 180 and blood pressure was 85/60.  He was given wide-open normal saline for 1 L and digoxin 0.25 mg IV.  Heart rate remained 160-170 with a BP of 93/59.  He was lucid and cooperative.  He denied any chest pain or palpitations.  No nausea or vomiting or abdominal pain.  He denied any headache or dizziness or blurred vision.  A stat EKG was ordered and revealed A. fib with RVR of 164.  Objective: Physical examination: Generally: Acutely ill elderly Caucasian male in mild respiratory distress with conversational dyspnea. Vital signs per history of present illness. Head - atraumatic, normocephalic.  Pupils - equal, round and reactive to light and accommodation. Extraocular movements are intact. No scleral icterus.  Oropharynx - moist mucous membranes and tongue. No pharyngeal erythema or exudate.  Neck - supple. No JVD. Carotid pulses 2+ bilaterally. No carotid bruits. No palpable thyromegaly or lymphadenopathy. Cardiovascular -irregularly irregular tachycardic, normal S1 and S2. No murmurs, gallops or rubs.  Lungs - clear to auscultation bilaterally with diminished bibasilar breath sounds..  Abdomen - soft and nontender. Positive bowel sounds. No palpable organomegaly or masses.  Extremities - no pitting edema, clubbing or cyanosis.  Neuro - grossly non-focal. Skin - no rashes. GU and rectal exam - deferred.  Labs and  notes were reviewed.  Assessment/plan:  Atrial fibrillation with left ventricular sponsor possibly new onset. - The patient will be started on IV amiodarone with bolus followed by drip given borderline blood pressure likely response to IV digoxin. - We will obtain TSH level as well as optimize his potassium  and check magnesium level. - He had a 2D echo about a week ago that revealed an EF of 55 to 60% with normal diastolic function and no other abnormalities.  Sepsis with septic shock secondary to urinary tract infection/Pseudomonas bacteremia -He has been weaned off of vasopressors -Initially on Unasyn, subsequently transition to cefepime for Pseudomonas coverage -Based on culture sensitivities, cefepime changed to ceftazidime -Urine culture positive for staph epidermidis -1 out of 4 blood cultures positive for Pseudomonas Plan: Continue Fortaz for now Infectious disease consulted, recommendations appreciated Continue Fortaz until 9/27   Acute metabolic encephalopathy -Etiology is not entirely clear -May have a component related to underlying infection, although has been afebrile for several days now -He does have a history of alcoholism and may have a component of Wernicke's, he has been receiving high-dose thiamine -Ammonia level normal, TSH normal  -CT head negative x2 -MRI brain unremarkable -EEG without evidence of epileptiform discharges -Overall electrolyte abnormalities have corrected -No further recommendations from neurology standpoint -Encephalopathy improved as of 9/22 -Dobbhoff was placed but was discontinued at some point on 9/21-9/22 Plan: Completed high-dose thiamine therapy Continue p.o. thiamine 100 mg daily SLP reengaged Diet advanced to dysphagia 2 Nutrition follow-up Still a risk for replacement of NGT if not meeting  nutritional goals Will encourage p.o. intake and monitor for now   History of alcohol abuse -He has not required any benzodiazepines in  several days' -CIWA protocol discontinued   Alcoholic cirrhosis -Continue to follow LFTs -No evidence of ascites at this time   Possible aspiration -Noted to have coughing/rhonchi after taking p.o. meds -Once mental status has improved, can consider swallow evaluation, will keep n.p.o. for now -Chest x-ray with bilateral infiltrates, suspect developing pneumonia -Currently, he is not requiring any oxygen -PCT 1.3 -Continue on IV antibiotics -speech therapy consult   Thrombocytopenia -Likely multifactorial, related to acute infectious process/recent alcohol use/cirrhosis -platelet count has started to trend up -Continue to monitor   Acute kidney injury -Secondary to hypovolemia -Overall renal function has improved with IV hydration   Urinary retention -Noted to have significant hematuria -Urology consulted and coud catheter was placed -Recommended to consider removing catheter once patient is more awake and alert   COPD -No wheezing at this time -Continue bronchodilators and inhaled steroids  Authorized and performed by: Valente David, MD Total critical care time: Approximately  30     minutes. Due to a high probability of clinically significant, life-threatening deterioration, the patient required my highest level of preparedness to intervene emergently and I personally spent this critical care time directly and personally managing the patient.  This critical care time included obtaining a history, examining the patient, pulse oximetry, ordering and review of studies, arranging urgent treatment with development of management plan, evaluation of patient's response to treatment, frequent reassessment, and discussions with other providers. This critical care time was performed to assess and manage the high probability of imminent, life-threatening deterioration that could result in multiorgan failure.  It was exclusive of separately billable procedures and treating other patients and  teaching time.  Please see MDM section and the rest of the note for further information on patient assessment and treatment.

## 2020-11-30 DIAGNOSIS — G9341 Metabolic encephalopathy: Secondary | ICD-10-CM | POA: Diagnosis not present

## 2020-11-30 DIAGNOSIS — Z515 Encounter for palliative care: Secondary | ICD-10-CM | POA: Diagnosis not present

## 2020-11-30 DIAGNOSIS — Z7189 Other specified counseling: Secondary | ICD-10-CM | POA: Diagnosis not present

## 2020-11-30 LAB — GLUCOSE, CAPILLARY
Glucose-Capillary: 101 mg/dL — ABNORMAL HIGH (ref 70–99)
Glucose-Capillary: 102 mg/dL — ABNORMAL HIGH (ref 70–99)
Glucose-Capillary: 109 mg/dL — ABNORMAL HIGH (ref 70–99)
Glucose-Capillary: 118 mg/dL — ABNORMAL HIGH (ref 70–99)
Glucose-Capillary: 90 mg/dL (ref 70–99)
Glucose-Capillary: 91 mg/dL (ref 70–99)

## 2020-11-30 NOTE — Progress Notes (Signed)
Palliative:  HPI: 65 y.o. male  with past medical history of COPD and alcohol abuse admitted on 11/14/2020 with alcohol intoxication and abd pain with UTI and bacteremia. Ongoing encephalopathy and concern for Wernicke's but with minimal improvement in mental status. Unable to take po intake with high aspiration risk given severe lethargy.   I met today with Dustin Vazquez who continues to be alert and in good spirits. He greets me in return "good morning" with a smile. He has no complaints. He is still very confused and oriented to self only. Unaware he is in the hospital and he tells me that the year is "1999." He has no recollection of his hospital stay or medical issues.   His son, Dustin Vazquez, comes in to visit during my assessment. Dustin Vazquez could not tell me who his son is (he eventually guessed this was a cousin). He does ask about his dog by name Ladell Heads) and this is correct information. I discussed with Dustin Vazquez concern for fever and possible aspiration yesterday but no further fevers and he continues to be alert and in good spirits. We discussed ongoing confusion and that this may not be reversible. We also discussed that he is eating a little but not great. Dustin Vazquez confirms that they would NOT want artificial nutrition. Since he is still awake they are hopeful for improvement but recognize there are still significant barriers. Also likely a need that he will need significant assistance and care into the future and even best case scenario unlikely to return to previous baseline or be able to live alone.   All questions/concerns addressed. Emotional support provided.   Exam: Alert, oriented to self only. Pleasantly confused. No distress. Thin,frail. Breathing regular, unlabored. Abd soft.   Plan: - DNR - NO FEEDING TUBE. No desire to restart artificial feeding.   [place], NP Palliative Medicine Team Pager [phone] (Please see [email or web address] for schedule) Team Phone  [phone]    Greater than 50%  of this time was spent counseling and coordinating care related to the above assessment and plan

## 2020-11-30 NOTE — Progress Notes (Signed)
PROGRESS NOTE    Dustin Vazquez  FIE:[id number] DOB: 1955-05-12 DOA: 11/14/2020 PCP: [facility]   Brief Narrative:  65 year old male is brought to the hospital after he was found on the side of the road with altered mental status.  Noted to be hypotensive, hypothermic on arrival with elevated creatinine, liver enzymes and lactic acid.  Noted to have urinary tract infection and was admitted to the hospital for septic shock requiring vasopressors.  He was admitted to the ICU for further management.  Overall hemodynamics improved with IV fluids. Transferred to [facility] on 9/14. Found to have 1/4 blood cultures positive for pseudomonas. He has been on IV antibiotics. He has had prolonged hospital course and remains encephalopathic. Neurology consulted for further input. Palliative care following for goals of care.  9/21: Patient remains encephalopathic.  Discussed with RD, plan to place Dobbhoff and initiate nasogastric tube feeds.  9/22: Patient had Dobbhoff in place.  Placement was confirmed on KUB into position for initiated.  This morning patient much more awake.  Dobbhoff was discontinued at some point was laying by the patient's bedside.  As patient is much more awake will reengage SLP for reevaluation.  Patient cleared for dysphagia diet.  9/26: Patient was noted to become more tachycardic overnight with a low-grade temperature.  Cross cover informed.  Patient was found to be in rapid atrial fibrillation.  Started on amiodarone gtt. with improvement in rate.   Assessment & Plan:   Active Problems:   Severe sepsis (HCC)   Severe sepsis with septic shock (CODE) (HCC)   Protein-calorie malnutrition, severe   Bacteremia due to Pseudomonas   Alcoholic cirrhosis of liver without ascites (HCC)   Acute metabolic encephalopathy   Thrombocytopenia (HCC)   Acute urinary retention   Hematuria   Acute lower UTI  Sepsis with septic shock secondary to urinary tract  infection/Pseudomonas bacteremia -He has been weaned off of vasopressors -Initially on Unasyn, subsequently transition to cefepime for Pseudomonas coverage -Based on culture sensitivities, cefepime changed to ceftazidime -Urine culture positive for staph epidermidis -1 out of 4 blood cultures positive for Pseudomonas -Breakthrough low-grade temperature of 100.7 -No white count Plan: Continue Fortaz for now Infectious disease consulted, recommendations appreciated Continue Fortaz until 9/27 Chest x-ray with no clinical signs of aspiration but monitor carefully If any clinical signs of infection may extend antibiotic course  New onset atrial fibrillation Continue amiodarone gtt. for now If heart rate remained stable recommend transition to p.o. amiodarone 200 twice daily on 9/28 Do not recommend anticoagulation as patient is a very poor candidate Continue telemetry monitoring for now   Acute metabolic encephalopathy -Etiology is not entirely clear -May have a component related to underlying infection, although has been afebrile for several days now -He does have a history of alcoholism and may have a component of Wernicke's, he has been receiving high-dose thiamine -Ammonia level normal, TSH normal  -CT head negative x2 -MRI brain unremarkable -EEG without evidence of epileptiform discharges -Overall electrolyte abnormalities have corrected -No further recommendations from neurology standpoint -Encephalopathy improved as of 9/22 -Dobbhoff was placed but was discontinued at some point on 9/21-9/22 Plan: Continue p.o. thiamine 100 mg daily Dysphagia 2 diet Nutrition follow-up Still a risk for replacement of NGT if not meeting nutritional goals Will encourage p.o. intake and monitor for now If encephalopathy remains persistent may need to consider de-escalation of care and comfort measures Consider reengage in palliative care and transition to comfort measures if mental status  remains very poor   History of alcohol abuse -He has not required any benzodiazepines in several days' -CIWA protocol discontinued   Alcoholic cirrhosis -Continue to follow LFTs -No evidence of ascites at this time     Thrombocytopenia -Likely multifactorial, related to acute infectious process/recent alcohol use/cirrhosis -platelet count has started to trend up -Continue to monitor   Acute kidney injury -Secondary to hypovolemia -Overall renal function has improved with IV hydration   Urinary retention -Noted to have significant hematuria -Urology consulted and coud catheter was placed -Recommended to consider removing catheter once patient is more awake and alert   COPD -No wheezing at this time -Continue bronchodilators and inhaled steroids   Goals of care -Patient was living independently in a camper -Discussed CODE STATUS with patient's son who confirmed DNR -He is overall malnourished and may have difficulty making a meaningful recovery -Palliative care consulted to further address goals of care   DVT prophylaxis: SCDs Code Status: DNR Family Communication: None today Disposition Plan: Status is: Inpatient  Remains inpatient appropriate because:Altered mental status and Inpatient level of care appropriate due to severity of illness  Dispo: The patient is from: Home              Anticipated d/c is to:  TBD              Patient currently is not medically stable to d/c.   Difficult to place patient No       Level of care: Progressive Cardiac  Consultants:  Neurology Palliative care Infectious disease  Procedures:  NG tube placement 9/21  Antimicrobials:  Ceftazidime   Subjective: Seen and examined.  Confused but awake.  Objective: Vitals:   11/30/20 0011 11/30/20 0423 11/30/20 0756 11/30/20 1151  BP: (!) 96/58 94/64 103/74 120/74  Pulse: 80 79 88 88  Resp: 16 18 17 17   Temp: 98.2 F (36.8 C) 98.4 F (36.9 C) 98.2 F (36.8 C) 98.3 F  (36.8 C)  TempSrc: Oral Oral    SpO2: 98% 98% 98% 97%  Weight:  61 kg    Height:        Intake/Output Summary (Last 24 hours) at 11/30/2020 1320 Last data filed at 11/30/2020 1152 Gross per 24 hour  Intake 2385.41 ml  Output 725 ml  Net 1660.41 ml   Filed Weights   11/28/20 0510 11/29/20 0300 11/30/20 0423  Weight: 61.2 kg 59 kg 61 kg    Examination:  General exam: Sitting up in bed eating.  No acute distress.  Appears frail and chronically ill. Respiratory system: Poor respiratory effort.  Bibasilar crackles.  Normal work of breathing.  Room air Cardiovascular system: S1-S2, RRR, no murmurs, no pedal edema Gastrointestinal system: Soft, nontender, nondistended, normal bowel sounds Central nervous system: Awake and alert.  Oriented x1 Extremities: Diffusely decreased power bilateral extremities Skin: No rashes, lesions or ulcers Psychiatry: Mood and affect flattened.  Insight Limited    Data Reviewed: I have personally reviewed following labs and imaging studies  CBC: Recent Labs  Lab 11/25/20 0518 11/27/20 0621 11/29/20 1229  WBC 8.0 5.7 8.5  NEUTROABS  --   --  5.8  HGB 11.4* 10.5* 9.7*  HCT 31.3* 31.7* 29.8*  MCV 94.0 95.5 99.0  PLT 118* 154 124*   Basic Metabolic Panel: Recent Labs  Lab 11/24/20 0452 11/25/20 0518 11/26/20 0105 11/27/20 0621 11/29/20 1229  NA  --  123* 126* 135 136  K  --  4.1 3.9 3.8 4.0  CL  --  98 102 104 110  CO2  --  23 21* 22 21*  GLUCOSE  --  98 98 84 118*  BUN  --  7* 8 7* 7*  CREATININE 0.48* 0.53* 0.50* 0.42* 0.49*  CALCIUM  --  7.8* 8.1* 8.3* 8.1*  MG  --  1.4* 2.0  --   --   PHOS  --  2.3* 2.5  --   --    GFR: Estimated Creatinine Clearance: 78.4 mL/min (A) (by C-G formula based on SCr of 0.49 mg/dL (L)). Liver Function Tests: Recent Labs  Lab 11/26/20 0105  AST 39  ALT 22  ALKPHOS 78  BILITOT 1.0  PROT 6.3*  ALBUMIN 2.0*   No results for input(s): LIPASE, AMYLASE in the last 168 hours. No results for  input(s): AMMONIA in the last 168 hours.  Coagulation Profile: No results for input(s): INR, PROTIME in the last 168 hours. Cardiac Enzymes: No results for input(s): CKTOTAL, CKMB, CKMBINDEX, TROPONINI in the last 168 hours. BNP (last 3 results) No results for input(s): PROBNP in the last 8760 hours. HbA1C: No results for input(s): HGBA1C in the last 72 hours. CBG: Recent Labs  Lab 11/29/20 1929 11/30/20 0012 11/30/20 0424 11/30/20 0758 11/30/20 1145  GLUCAP 102* 102* 90 91 109*   Lipid Profile: No results for input(s): CHOL, HDL, LDLCALC, TRIG, CHOLHDL, LDLDIRECT in the last 72 hours. Thyroid Function Tests: No results for input(s): TSH, T4TOTAL, FREET4, T3FREE, THYROIDAB in the last 72 hours. Anemia Panel: No results for input(s): VITAMINB12, FOLATE, FERRITIN, TIBC, IRON, RETICCTPCT in the last 72 hours.  Sepsis Labs: Recent Labs  Lab 11/29/20 1229  PROCALCITON 0.31     No results found for this or any previous visit (from the past 240 hour(s)).        Radiology Studies: DG Chest Port 1 View  Result Date: 11/29/2020 CLINICAL DATA:  New onset fever. EXAM: PORTABLE CHEST 1 VIEW COMPARISON:  11/19/2020 and CT chest 12/04/2016. FINDINGS: Trachea is midline. Heart size stable. Thoracic aorta is calcified. Streaky densities in the right upper lobe and left perihilar region are again seen. Postoperative changes in the left perihilar region. No airspace consolidation or pleural fluid. Slightly elevated left hemidiaphragm. IMPRESSION: No acute findings. Electronically Signed   By: Leanna Battles M.D.   On: 11/29/2020 15:10        Scheduled Meds:  budesonide (PULMICORT) nebulizer solution  0.25 mg Nebulization BID   Chlorhexidine Gluconate Cloth  6 each Topical Daily   enoxaparin (LOVENOX) injection  40 mg Subcutaneous Q24H   feeding supplement (NEPRO CARB STEADY)  237 mL Oral TID BM   folic acid  1 mg Per Tube Daily   mouth rinse  15 mL Mouth Rinse BID    multivitamin with minerals  1 tablet Oral Daily   pantoprazole (PROTONIX) IV  40 mg Intravenous Q24H   thiamine  100 mg Oral Daily   Continuous Infusions:  sodium chloride 250 mL (11/17/20 1235)   amiodarone 30 mg/hr (11/30/20 0812)   cefTAZidime (FORTAZ)  IV Stopped (11/30/20 0547)     LOS: 16 days    Time spent: 25 minutes    Tresa Moore, MD [facility] Pager [phone]  If 7PM-7AM, please contact night-coverage 11/30/2020, 1:20 PM

## 2020-12-01 ENCOUNTER — Inpatient Hospital Stay: Payer: Medicare Other

## 2020-12-01 DIAGNOSIS — R3912 Poor urinary stream: Secondary | ICD-10-CM

## 2020-12-01 DIAGNOSIS — R310 Gross hematuria: Secondary | ICD-10-CM

## 2020-12-01 DIAGNOSIS — D696 Thrombocytopenia, unspecified: Secondary | ICD-10-CM

## 2020-12-01 LAB — GLUCOSE, CAPILLARY
Glucose-Capillary: 106 mg/dL — ABNORMAL HIGH (ref 70–99)
Glucose-Capillary: 107 mg/dL — ABNORMAL HIGH (ref 70–99)
Glucose-Capillary: 96 mg/dL (ref 70–99)
Glucose-Capillary: 93 mg/dL (ref 70–99)
Glucose-Capillary: 118 mg/dL — ABNORMAL HIGH (ref 70–99)
Glucose-Capillary: 101 mg/dL — ABNORMAL HIGH (ref 70–99)

## 2020-12-01 MED ORDER — PANTOPRAZOLE SODIUM 40 MG PO TBEC
40.0000 mg | DELAYED_RELEASE_TABLET | ORAL | Status: DC
  Administered 2020-12-01 – 2020-12-06 (×6): 40 mg via ORAL
  Filled 2020-12-01 (×6): qty 1

## 2020-12-01 MED ORDER — FOLIC ACID 1 MG PO TABS
1.0000 mg | ORAL_TABLET | Freq: Every day | ORAL | Status: DC
  Administered 2020-12-02 – 2020-12-07 (×6): 1 mg via ORAL
  Filled 2020-12-01 (×6): qty 1

## 2020-12-01 NOTE — Progress Notes (Addendum)
Notice Pt only has minimal urine output with coude cathter. Bladder scan is . The urine color is red. Pt asymptomatics. Per day shift nurse report, pt was trying to pull it out several times, but has not happened tonight. Dr Arville Care notified, and think may need to be replace. Consult with ICU RN, The Coude cathter was placed in by urology Dr Lonna Cobb, and it need be replace by Urologist.   Informed Dr Arville Care about this situation, Dr Arville Care agree to wait for now until Urologist has been notified at 6 am. In the meantime, would hold off all the fluid.   At 0552, Another Bladder scan is 950 ml. Output in the bag is 300 for entire shift.   At 0600, Talked with Dr Lonna Cobb by phone, told him about this patient current condition. Dr Lonna Cobb said there will be another urologist come to see the pt this morning and ok to wait at this moment. RN will keep monitoring

## 2020-12-01 NOTE — TOC Progression Note (Signed)
Transition of Care [facility]) - Progression Note    Patient Details  Name: Dustin Vazquez MRN: [id number] Date of Birth: 07/31/1955  Transition of Care [facility]) CM/SW Contact  Gildardo Griffes, [place] Phone Number: 12/01/2020, 8:28 AM  Clinical Narrative:     CSW notes that dispo planning is uncertain pending medical course. Palliative involved in which it is noted that family are hopeful for improvement in patient but recognize there are still significant barriers.   TOC to continue to follow.   Expected Discharge Plan: Skilled Nursing Facility Barriers to Discharge: Barriers Unresolved (comment) (No family members or supports)  Expected Discharge Plan and Services Expected Discharge Plan: Skilled Nursing Facility In-house Referral: Clinical Social Work     Living arrangements for the past 2 months: No permanent address                                       Social Determinants of Health (SDOH) Interventions    Readmission Risk Interventions No flowsheet data found.

## 2020-12-01 NOTE — Progress Notes (Signed)
PROGRESS NOTE    Dustin Vazquez  JJO:[id number] DOB: November 21, 1955 DOA: 11/14/2020 [facility]: [facility]   Brief Narrative:  65 year old male is brought to the hospital after he was found on the side of the road with altered mental status.  Noted to be hypotensive, hypothermic on arrival with elevated creatinine, liver enzymes and lactic acid.  Noted to have urinary tract infection and was admitted to the hospital for septic shock requiring vasopressors.  He was admitted to the ICU for further management.  Overall hemodynamics improved with IV fluids. Transferred to [facility] on 9/14. Found to have 1/4 blood cultures positive for pseudomonas. He has been on IV antibiotics. He has had prolonged hospital course and remains encephalopathic. Neurology consulted for further input. Palliative care following for goals of care.  9/21: Patient remains encephalopathic.  Discussed with RD, plan to place Dobbhoff and initiate nasogastric tube feeds.  9/22: Patient had Dobbhoff in place.  Placement was confirmed on KUB into position for initiated.  This morning patient much more awake.  Dobbhoff was discontinued at some point was laying by the patient's bedside.  As patient is much more awake will reengage SLP for reevaluation.  Patient cleared for dysphagia diet.  9/26: Patient was noted to become more tachycardic overnight with a low-grade temperature.  Cross cover informed.  Patient was found to be in rapid atrial fibrillation.  Started on amiodarone gtt. with improvement in rate.   Assessment & Plan:   Active Problems:   Severe sepsis (HCC)   Severe sepsis with septic shock (CODE) (HCC)   Protein-calorie malnutrition, severe   Bacteremia due to Pseudomonas   Alcoholic cirrhosis of liver without ascites (HCC)   Acute metabolic encephalopathy   Thrombocytopenia (HCC)   Acute urinary retention   Hematuria   Acute lower UTI  Sepsis with septic shock secondary to urinary tract  infection Pseudomonas bacteremia -He has been weaned off of vasopressors -Initially on Unasyn, subsequently transition to cefepime for Pseudomonas coverage -Based on culture sensitivities, cefepime changed to ceftazidime -Urine culture positive for staph epidermidis -1 out of 4 blood cultures positive for Pseudomonas --finished course of abx with [place].  New onset atrial fibrillation --may be triggered by infection --started on amio gtt, now converted and rate controlled --d/c amio gtt --Monitor off of oral rate control agent   Acute metabolic encephalopathy -Etiology is not entirely clear -May have a component related to underlying infection, although has been afebrile for several days now -He does have a history of alcoholism and may have a component of Wernicke's, he has been receiving high-dose thiamine -Ammonia level normal, TSH normal  -CT head negative x2 -MRI brain unremarkable -EEG without evidence of epileptiform discharges -Overall electrolyte abnormalities have corrected -No further recommendations from neurology standpoint -Encephalopathy improved as of 9/22, but still confused -Dobbhoff was placed but was discontinued at some point on 9/21-9/22 Plan: Continue p.o. thiamine 100 mg daily Dysphagia 2 diet Nutrition follow-up  History of alcohol abuse -He has not required any benzodiazepines in several days   Alcoholic cirrhosis -Continue to follow LFTs --US abdomen found mod ascites today --IR for paracentesis tomorrow   Thrombocytopenia -Likely multifactorial, related to acute infectious process/recent alcohol use/cirrhosis -platelet count has started to trend up -Continue to monitor   Acute kidney injury -Secondary to hypovolemia -Overall renal function has improved with IV hydration   Urinary retention -Noted to have significant hematuria -Urology consulted and coud catheter was placed --Coude cath exchanged today   COPD -  Continue bronchodilators  and inhaled steroids   Goals of care -Patient was living independently in a camper -Discussed CODE STATUS with patient's son who confirmed DNR -He is overall malnourished and may have difficulty making a meaningful recovery -Palliative care consulted to further address goals of care  Protein-calorie malnutrition, severe --Nepro TID   DVT prophylaxis: SCDs Code Status: DNR Family Communication:  Disposition Plan: Status is: Inpatient  Dispo: The patient is from: Home              Anticipated d/c is to:  TBD              Patient currently is not medically stable to d/c.   Difficult to place patient No   Level of care: Progressive Cardiac  Consultants:  Neurology Palliative care Infectious disease  Procedures:  NG tube placement 9/21  Antimicrobials:  Ceftazidime   Subjective: Urology replaced Coude Foley cath today.  Bladder scan still noted 800 ml.  US abdomen confirmed mod ascites.   Objective: Vitals:   12/01/20 0810 12/01/20 1133 12/01/20 1604 12/01/20 1925  BP:  (!) 95/54 109/62   Pulse:  73 83   Resp:  17 17   Temp:  98.3 F (36.8 C) 98.1 F (36.7 C)   TempSrc:  Oral    SpO2: 94% 94% 97% 94%  Weight:      Height:        Intake/Output Summary (Last 24 hours) at 12/01/2020 1944 Last data filed at 12/01/2020 1030 Gross per 24 hour  Intake 300 ml  Output 575 ml  Net -275 ml   Filed Weights   11/29/20 0300 11/30/20 0423 12/01/20 0600  Weight: 59 kg 61 kg 62 kg    Examination:  Constitutional: NAD, alert, confused HEENT: conjunctivae and lids normal, EOMI CV: No cyanosis.   RESP: normal respiratory effort, on RA Extremities: No effusions, edema in BLE SKIN: warm, dry Neuro: II - XII grossly intact.   Foley present, dried blood around penis   Data Reviewed: I have personally reviewed following labs and imaging studies  CBC: Recent Labs  Lab 11/25/20 0518 11/27/20 0621 11/29/20 1229  WBC 8.0 5.7 8.5  NEUTROABS  --   --  5.8  HGB  11.4* 10.5* 9.7*  HCT 31.3* 31.7* 29.8*  MCV 94.0 95.5 99.0  PLT 118* 154 124*   Basic Metabolic Panel: Recent Labs  Lab 11/25/20 0518 11/26/20 0105 11/27/20 0621 11/29/20 1229  NA 123* 126* 135 136  K 4.1 3.9 3.8 4.0  CL 98 102 104 110  CO2 23 21* 22 21*  GLUCOSE 98 98 84 118*  BUN 7* 8 7* 7*  CREATININE 0.53* 0.50* 0.42* 0.49*  CALCIUM 7.8* 8.1* 8.3* 8.1*  MG 1.4* 2.0  --   --   PHOS 2.3* 2.5  --   --    GFR: Estimated Creatinine Clearance: 79.7 mL/min (A) (by C-G formula based on SCr of 0.49 mg/dL (L)). Liver Function Tests: Recent Labs  Lab 11/26/20 0105  AST 39  ALT 22  ALKPHOS 78  BILITOT 1.0  PROT 6.3*  ALBUMIN 2.0*   No results for input(s): LIPASE, AMYLASE in the last 168 hours. No results for input(s): AMMONIA in the last 168 hours.  Coagulation Profile: No results for input(s): INR, PROTIME in the last 168 hours. Cardiac Enzymes: No results for input(s): CKTOTAL, CKMB, CKMBINDEX, TROPONINI in the last 168 hours. BNP (last 3 results) No results for input(s): PROBNP in the last 8760 hours.  HbA1C: No results for input(s): HGBA1C in the last 72 hours. CBG: Recent Labs  Lab 12/01/20 0548 12/01/20 0805 12/01/20 1131 12/01/20 1605 12/01/20 1927  GLUCAP 106* 107* 93 118* 101*   Lipid Profile: No results for input(s): CHOL, HDL, LDLCALC, TRIG, CHOLHDL, LDLDIRECT in the last 72 hours. Thyroid Function Tests: No results for input(s): TSH, T4TOTAL, FREET4, T3FREE, THYROIDAB in the last 72 hours. Anemia Panel: No results for input(s): VITAMINB12, FOLATE, FERRITIN, TIBC, IRON, RETICCTPCT in the last 72 hours.  Sepsis Labs: Recent Labs  Lab 11/29/20 1229  PROCALCITON 0.31     No results found for this or any previous visit (from the past 240 hour(s)).        Radiology Studies: [place] ASCITES (ABDOMEN LIMITED)  Result Date: 12/01/2020 CLINICAL DATA:  Abdominal distension EXAM: LIMITED ABDOMEN ULTRASOUND FOR ASCITES TECHNIQUE: Limited ultrasound  survey for ascites was performed in all four abdominal quadrants. COMPARISON:  None. FINDINGS: Moderate amount ascites. Largest fluid pocket is seen in the right lower quadrant. Cirrhotic morphology of the liver IMPRESSION: Moderate ascites Electronically Signed   By: Jasmine Pang M.D.   On: 12/01/2020 16:45        Scheduled Meds:  budesonide (PULMICORT) nebulizer solution  0.25 mg Nebulization BID   Chlorhexidine Gluconate Cloth  6 each Topical Daily   enoxaparin (LOVENOX) injection  40 mg Subcutaneous Q24H   feeding supplement (NEPRO CARB STEADY)  237 mL Oral TID BM   [START ON 12/02/2020] folic acid  1 mg Oral Daily   mouth rinse  15 mL Mouth Rinse BID   multivitamin with minerals  1 tablet Oral Daily   pantoprazole  40 mg Oral Q24H   thiamine  100 mg Oral Daily   Continuous Infusions:  sodium chloride 250 mL (11/30/20 1431)     LOS: 17 days     Darlin Priestly, MD [facility] Pager [phone]  If 7PM-7AM, please contact night-coverage 12/01/2020, 7:44 PM

## 2020-12-01 NOTE — Progress Notes (Signed)
Urology Consult Follow Up  Subjective: We were asked to evaluate the patient as he has had minimal output with gross hematuria.  Per nursing report, patient has been trying to pull his catheter out several times.  As he only had out platelet 300 cc the entire shift bladder scan was ordered which noted 768 mL.  VSS afebrile.  Patient is confused and cannot contribute to history.  Foley catheter in place, not draining and red-colored urine is in the overnight bag.  I attempted to irrigate the catheter that was in place to no avail.  I then removed that Foley catheter and patient urinated and I was able to capture 300 cc in a container, but the rest the urine was captured by the Chux pad.  I repeated bladder scan noted a PVR of over 500 cc, so I replaced the catheter.  See procedure note down below.  Anti-infectives: Anti-infectives (From admission, onward)    Start     Dose/Rate Route Frequency Ordered Stop   11/22/20 2000  cefTAZidime (FORTAZ) injection 2 g  Status:  Discontinued        2 g Intramuscular Every 8 hours 11/22/20 1847 11/22/20 1904   11/22/20 2000  cefTAZidime (FORTAZ) 2 g in sodium chloride 0.9 % 100 mL IVPB        2 g 200 mL/hr over 30 Minutes Intravenous Every 8 hours 11/22/20 1904 12/01/20 0754   11/20/20 0630  ceFEPIme (MAXIPIME) 2 g in sodium chloride 0.9 % 100 mL IVPB  Status:  Discontinued        2 g 200 mL/hr over 30 Minutes Intravenous Every 8 hours 11/20/20 0530 11/22/20 1730   11/19/20 2200  vancomycin (VANCOREADY) IVPB 750 mg/150 mL  Status:  Discontinued        750 mg 150 mL/hr over 60 Minutes Intravenous Every 12 hours 11/19/20 1202 11/20/20 0529   11/19/20 1100  vancomycin (VANCOREADY) IVPB 1500 mg/300 mL        1,500 mg 150 mL/hr over 120 Minutes Intravenous  Once 11/19/20 1051 11/19/20 1412   11/17/20 1900  Ampicillin-Sulbactam (UNASYN) 3 g in sodium chloride 0.9 % 100 mL IVPB  Status:  Discontinued        3 g 200 mL/hr over 30 Minutes Intravenous  Every 6 hours 11/17/20 1751 11/20/20 0529   11/17/20 1830  azithromycin (ZITHROMAX) 500 mg in sodium chloride 0.9 % 250 mL IVPB  Status:  Discontinued        500 mg 250 mL/hr over 60 Minutes Intravenous Every 24 hours 11/17/20 1737 11/20/20 0529   11/17/20 1000  azithromycin (ZITHROMAX) tablet 250 mg  Status:  Discontinued       See Hyperspace for full Linked Orders Report.   250 mg Oral Daily 11/16/20 1104 11/17/20 1737   11/16/20 2200  amoxicillin-clavulanate (AUGMENTIN) 875-125 MG per tablet 1 tablet  Status:  Discontinued        1 tablet Oral Every 12 hours 11/16/20 1104 11/17/20 1737   11/16/20 1200  azithromycin (ZITHROMAX) tablet 500 mg       See Hyperspace for full Linked Orders Report.   500 mg Oral Daily 11/16/20 1104 11/16/20 1244   11/16/20 0000  cefTRIAXone (ROCEPHIN) 2 g in sodium chloride 0.9 % 100 mL IVPB  Status:  Discontinued        2 g 200 mL/hr over 30 Minutes Intravenous Every 24 hours 11/15/20 0058 11/15/20 0058   11/15/20 2300  cefTRIAXone (ROCEPHIN) 2 g in sodium chloride  0.9 % 100 mL IVPB  Status:  Discontinued        2 g 200 mL/hr over 30 Minutes Intravenous Every 24 hours 11/15/20 0058 11/16/20 1104   11/15/20 2200  cefTRIAXone (ROCEPHIN) 2 g in sodium chloride 0.9 % 100 mL IVPB  Status:  Discontinued        2 g 200 mL/hr over 30 Minutes Intravenous Every 24 hours 11/14/20 2204 11/15/20 0058   11/14/20 2230  cefTRIAXone (ROCEPHIN) 1 g in sodium chloride 0.9 % 100 mL IVPB        1 g 200 mL/hr over 30 Minutes Intravenous  Once 11/14/20 2219 11/15/20 0027   11/14/20 2115  cefTRIAXone (ROCEPHIN) 1 g in sodium chloride 0.9 % 100 mL IVPB        1 g 200 mL/hr over 30 Minutes Intravenous  Once 11/14/20 2101 11/14/20 2146       Current Facility-Administered Medications  Medication Dose Route Frequency Provider Last Rate Last Admin   0.9 %  sodium chloride infusion  250 mL Intravenous Continuous Mansy, Jan A, MD 10 mL/hr at 11/30/20 1431 250 mL at 11/30/20 1431    acetaminophen (TYLENOL) tablet 650 mg  650 mg Oral Q6H PRN Mansy, Jan A, MD   650 mg at 11/29/20 0341   Or   acetaminophen (TYLENOL) suppository 650 mg  650 mg Rectal Q6H PRN Mansy, Jan A, MD       amiodarone (NEXTERONE PREMIX) 360-4.14 MG/200ML-% (1.8 mg/mL) IV infusion  30 mg/hr Intravenous Continuous Mansy, Jan A, MD 16.67 mL/hr at 12/01/20 0630 30 mg/hr at 12/01/20 0630   budesonide (PULMICORT) nebulizer solution 0.25 mg  0.25 mg Nebulization BID Erin Fulling, MD   0.25 mg at 11/30/20 1939   Chlorhexidine Gluconate Cloth 2 % PADS 6 each  6 each Topical Daily Erin Fulling, MD   6 each at 11/30/20 0942   enoxaparin (LOVENOX) injection 40 mg  40 mg Subcutaneous Q24H Lolita Patella B, MD   40 mg at 11/30/20 2044   feeding supplement (NEPRO CARB STEADY) liquid 237 mL  237 mL Oral TID BM Sreenath, Sudheer B, MD 0 mL/hr at 11/25/20 2131 237 mL at 11/30/20 2044   folic acid (FOLVITE) tablet 1 mg  1 mg Per Tube Daily Georgeann Oppenheim, Sudheer B, MD   1 mg at 11/30/20 0939   ipratropium-albuterol (DUONEB) 0.5-2.5 (3) MG/3ML nebulizer solution 3 mL  3 mL Nebulization Q6H PRN Jimmye Norman, NP   3 mL at 11/16/20 0541   magnesium hydroxide (MILK OF MAGNESIA) suspension 30 mL  30 mL Oral Daily PRN Mansy, Jan A, MD       MEDLINE mouth rinse  15 mL Mouth Rinse BID Georgeann Oppenheim, Sudheer B, MD   15 mL at 11/30/20 2044   multivitamin with minerals tablet 1 tablet  1 tablet Oral Daily Lolita Patella B, MD   1 tablet at 11/30/20 0939   ondansetron (ZOFRAN) tablet 4 mg  4 mg Oral Q6H PRN Mansy, Jan A, MD       Or   ondansetron [facility]) injection 4 mg  4 mg Intravenous Q6H PRN Mansy, Jan A, MD       pantoprazole (PROTONIX) injection 40 mg  40 mg Intravenous Q24H Erick Blinks, MD   40 mg at 11/30/20 2044   thiamine tablet 100 mg  100 mg Oral Daily Lolita Patella B, MD   100 mg at 11/30/20 2706     Objective: Vital signs in last 24 hours: Temp:  [  97.6 F (36.4 C)-98.4 F (36.9 C)] 98 F (36.7 C)  (09/28 0459) Pulse Rate:  [88-110] 93 (09/28 0459) Resp:  [16-18] 16 (09/28 0459) BP: (99-120)/(66-85) 99/66 (09/28 0459) SpO2:  [96 %-98 %] 96 % (09/28 0459) Weight:  [62 kg] 62 kg (09/28 0600)  Intake/Output from previous day: 09/27 0701 - 09/28 0700 In: 1353.7 [P.O.:480; I.V.:413.7; IV Piggyback:400] Out: 900 [Urine:900] Intake/Output this shift: No intake/output data recorded.   Physical Exam Constitutional:  Well nourished.  Disheveled.  Confused. HEENT: [place] AT, moist mucus membranes.  Trachea midline Cardiovascular: No clubbing, cyanosis, or edema. Respiratory: Normal respiratory effort, no increased work of breathing. GU: No CVA tenderness.  No bladder fullness or masses.  Patient with uncircumcised phallus. Foreskin easily retracted  Urethral meatus is patent.  Blood seen at the meatus after Foley was removed.  No penile discharge. No penile lesions or rashes. Scrotum without lesions, cysts, rashes and/or edema.   Neurologic: Moving all 4 extremities. Psychiatric: Normal mood and affect.   Lab Results:  Recent Labs    11/29/20 1229  WBC 8.5  HGB 9.7*  HCT 29.8*  PLT 124*   BMET Recent Labs    11/29/20 1229  NA 136  K 4.0  CL 110  CO2 21*  GLUCOSE 118*  BUN 7*  CREATININE 0.49*  CALCIUM 8.1*   PT/INR No results for input(s): LABPROT, INR in the last 72 hours. ABG No results for input(s): PHART, HCO3 in the last 72 hours.  Invalid input(s): PCO2, PO2  Studies/Results: DG Chest Port 1 View  Result Date: 11/29/2020 CLINICAL DATA:  New onset fever. EXAM: PORTABLE CHEST 1 VIEW COMPARISON:  11/19/2020 and CT chest 12/04/2016. FINDINGS: Trachea is midline. Heart size stable. Thoracic aorta is calcified. Streaky densities in the right upper lobe and left perihilar region are again seen. Postoperative changes in the left perihilar region. No airspace consolidation or pleural fluid. Slightly elevated left hemidiaphragm. IMPRESSION: No acute findings. Electronically  Signed   By: Leanna Battles M.D.   On: 11/29/2020 15:10     Procedure note: Catheter Removal 8 ml of water was drained from the balloon. A 14 FR Coloplast coude foley cath was removed from the bladder no complications were noted .   Simple Catheter Placement Patient was cleaned and prepped in a sterile fashion with betadine.  A 16 French straight two-way catheter was attempted, but placement was unsuccessful.  A 14 FR Coloplast foley catheter was inserted, urine return was noted  400 ml, urine was red clear in color.  The balloon was filled with 10cc of sterile water.  A night bag was attached for drainage. no complications were noted   Bladder Irrigation  300 ml of sterile water was instilled and irrigated into the bladder with a 46ml [place] syringe through the catheter in place.  A long stringy clot was obtained and urine cleared quickly to yellow clear which indicates the gross hematuria is likely due to urethral trauma from patient tugging on the catheter.  After irrigation urine flow was noted no complications were noted catheter is now draining fine.  Catheter was reattached to the night bag for drainage. Patient tolerated well.   Assessment and Plan: 65 year old male admitted for altered mental status who continues to remain confused and tugging on his catheter.  Nursing staff noted the catheter stopped draining sometime during the evening and urology was called to exchange the catheter.  Patient was unable to void without the catheter.   Likely continued  traumatic injury to the urethra as patient has been found tugging on the catheter causing gross hematuria and blockage of the Foley catheter with clot and urine cleared easily with irrigation.  Recommendations: -Continue to monitor for gross heme and irrigate as needed to maintain Foley drainage as the catheter is of a smaller size and easier to clot off -Continue with Foley catheter until patient is more alert/ambulatory       LOS: 17 days    [facility] 12/01/2020

## 2020-12-01 NOTE — Progress Notes (Signed)
Patient's catheter from this morning stopped draining and so I went to the floor to investigate.  The [address] [place] Coloplast daily was in place with clear yellow urine in the tubing, but the urine bag had 800 cc of red urine.  I irrigated the 14 [employer] and retrieved a long stringy clot, but urine did not flow from the tubing.  Then made a decision to place an 15 [place] Coloplast coud due to his history of hematuria and clot blockage of previous catheters.  Cath Change/ Replacement   10 ml of water was removed from the balloon, a 14 FR Coude foley cath was removed with out difficulty.  Patient was cleaned and prepped in a sterile fashion with betadine. A 16 FR coude foley cath was replaced into the bladder no complications were noted.  I then irrigated the 16 [place] coud Foley catheter with 140 cc with no return of clot and clear urine.  The catheter did not begin to flow, so we rescan the patient noting greater than 800 cc on the scanner.    Previous renal ultrasound during this admission had noted cirrhosis of the liver, so the bladder scanner may be picking up ascites.  We have scheduled an abdominal ultrasound for further evaluation of this.
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
## 2020-12-02 ENCOUNTER — Inpatient Hospital Stay: Payer: Medicare Other

## 2020-12-02 LAB — GLUCOSE, CAPILLARY
Glucose-Capillary: 101 mg/dL — ABNORMAL HIGH (ref 70–99)
Glucose-Capillary: 111 mg/dL — ABNORMAL HIGH (ref 70–99)
Glucose-Capillary: 114 mg/dL — ABNORMAL HIGH (ref 70–99)
Glucose-Capillary: 137 mg/dL — ABNORMAL HIGH (ref 70–99)
Glucose-Capillary: 93 mg/dL (ref 70–99)
Glucose-Capillary: 94 mg/dL (ref 70–99)

## 2020-12-02 LAB — BASIC METABOLIC PANEL
Anion gap: 9 (ref 5–15)
BUN: 8 mg/dL (ref 8–23)
CO2: 22 mmol/L (ref 22–32)
Calcium: 8.4 mg/dL — ABNORMAL LOW (ref 8.9–10.3)
Chloride: 105 mmol/L (ref 98–111)
Creatinine, Ser: 0.55 mg/dL — ABNORMAL LOW (ref 0.61–1.24)
GFR, Estimated: >60 mL/min (ref >60)
Glucose, Bld: 100 mg/dL — ABNORMAL HIGH (ref 70–99)
Potassium: 3.9 mmol/L (ref 3.5–5.1)
Sodium: 136 mmol/L (ref 135–145)

## 2020-12-02 LAB — CBC
HCT: 31.1 % — ABNORMAL LOW (ref 39.0–52.0)
Hemoglobin: 10.2 g/dL — ABNORMAL LOW (ref 13.0–17.0)
MCH: 32.2 pg (ref 26.0–34.0)
MCHC: 32.8 g/dL (ref 30.0–36.0)
MCV: 98.1 fL (ref 80.0–100.0)
Platelets: 124 10*3/uL — ABNORMAL LOW (ref 150–400)
RBC: 3.17 MIL/uL — ABNORMAL LOW (ref 4.22–5.81)
RDW: 15.1 % (ref 11.5–15.5)
WBC: 5 10*3/uL (ref 4.0–10.5)
nRBC: 0 % (ref 0.0–0.2)

## 2020-12-02 LAB — BODY FLUID CELL COUNT WITH DIFFERENTIAL
Lymphs, Fluid: 11 %
Monocyte-Macrophage-Serous Fluid: 48 %
Neutrophil Count, Fluid: 41 %
Total Nucleated Cell Count, Fluid: 485 cu mm

## 2020-12-02 LAB — ALBUMIN, PLEURAL OR PERITONEAL FLUID: Albumin, Fluid: <1.5 g/dL

## 2020-12-02 LAB — PROTEIN, PLEURAL OR PERITONEAL FLUID: Total protein, fluid: <3 g/dL

## 2020-12-02 LAB — MAGNESIUM: Magnesium: 1.4 mg/dL — ABNORMAL LOW (ref 1.7–2.4)

## 2020-12-02 MED ORDER — ALBUTEROL SULFATE (2.5 MG/3ML) 0.083% IN NEBU: 3.0000 mL | INHALATION_SOLUTION | Freq: Four times a day (QID) | RESPIRATORY_TRACT | Status: DC | PRN

## 2020-12-02 MED ORDER — HALOPERIDOL LACTATE 5 MG/ML IJ SOLN: 2.5000 mg | Freq: Once | INTRAMUSCULAR | Status: DC

## 2020-12-02 MED ORDER — MAGNESIUM SULFATE 4 GM/100ML IV SOLN
4.0000 g | Freq: Once | INTRAVENOUS | Status: AC
  Administered 2020-12-02: 4 g via INTRAVENOUS
  Filled 2020-12-02: qty 100

## 2020-12-02 MED ORDER — ALBUMIN HUMAN 25 % IV SOLN
25.0000 g | Freq: Once | INTRAVENOUS | Status: AC
  Administered 2020-12-02: 25 g via INTRAVENOUS
  Filled 2020-12-02: qty 100

## 2020-12-02 NOTE — Progress Notes (Signed)
PROGRESS NOTE    Dustin Vazquez  NLG:[id number] DOB: 1955-04-10 DOA: 11/14/2020 PCP: [facility]   Brief Narrative:  65 year old male is brought to the hospital after he was found on the side of the road with altered mental status.  Noted to be hypotensive, hypothermic on arrival with elevated creatinine, liver enzymes and lactic acid.  Noted to have urinary tract infection and was admitted to the hospital for septic shock requiring vasopressors.  He was admitted to the ICU for further management.  Overall hemodynamics improved with IV fluids. Transferred to [facility] on 9/14. Found to have 1/4 blood cultures positive for pseudomonas. He has been on IV antibiotics. He has had prolonged hospital course and remains encephalopathic. Neurology consulted for further input. Palliative care following for goals of care.  9/21: Patient remains encephalopathic.  Discussed with RD, plan to place Dobbhoff and initiate nasogastric tube feeds.  9/22: Patient had Dobbhoff in place.  Placement was confirmed on KUB into position for initiated.  This morning patient much more awake.  Dobbhoff was discontinued at some point was laying by the patient's bedside.  As patient is much more awake will reengage SLP for reevaluation.  Patient cleared for dysphagia diet.  9/26: Patient was noted to become more tachycardic overnight with a low-grade temperature.  Cross cover informed.  Patient was found to be in rapid atrial fibrillation.  Started on amiodarone gtt. with improvement in rate.   Assessment & Plan:   Active Problems:   Severe sepsis (HCC)   Severe sepsis with septic shock (CODE) (HCC)   Protein-calorie malnutrition, severe   Bacteremia due to Pseudomonas   Alcoholic cirrhosis of liver without ascites (HCC)   Acute metabolic encephalopathy   Thrombocytopenia (HCC)   Acute urinary retention   Hematuria   Acute lower UTI  Sepsis with septic shock secondary to urinary tract  infection Pseudomonas bacteremia -He has been weaned off of vasopressors -Initially on Unasyn, subsequently transition to cefepime for Pseudomonas coverage -Based on culture sensitivities, cefepime changed to ceftazidime -Urine culture positive for staph epidermidis -1 out of 4 blood cultures positive for Pseudomonas --finished course of abx with [place].  New onset atrial fibrillation --may be triggered by infection --started on amio gtt, now converted and rate controlled, amio gtt d/c'ed on 9/28 --Monitor off of oral rate control agent   Acute metabolic encephalopathy -Etiology is not entirely clear -May have a component related to underlying infection, although has been afebrile for several days now -He does have a history of alcoholism and may have a component of Wernicke's, he has been receiving high-dose thiamine -Ammonia level normal, TSH normal  -CT head negative x2 -MRI brain unremarkable -EEG without evidence of epileptiform discharges -Overall electrolyte abnormalities have corrected -No further recommendations from neurology standpoint -Encephalopathy improved as of 9/22, but still confused -Dobbhoff was placed but was discontinued at some point on 9/21-9/22 Plan: Continue p.o. thiamine 100 mg daily Dysphagia 2 diet Nutrition follow-up  History of alcohol abuse -He has not required any benzodiazepines in several days   Alcoholic cirrhosis -Continue to follow LFTs --US abdomen found mod ascites  --IR for paracentesis today   Thrombocytopenia -Likely multifactorial, related to acute infectious process/recent alcohol use/cirrhosis -platelet count has started to trend up -Continue to monitor   Acute kidney injury -Secondary to hypovolemia -Overall renal function has improved with IV hydration   Urinary retention -Noted to have significant hematuria -Urology consulted and coud catheter was placed --Coude cath exchanged on 9/28  COPD -Continue  bronchodilators and inhaled steroids   Goals of care -Patient was living independently in a camper -Discussed CODE STATUS with patient's son who confirmed DNR -He is overall malnourished and may have difficulty making a meaningful recovery -Palliative care consulted to further address goals of care  Protein-calorie malnutrition, severe --Nepro TID   DVT prophylaxis: SCDs Code Status: DNR Family Communication:  Disposition Plan: Status is: Inpatient  Dispo: The patient is from: Home              Anticipated d/c is to: SNF              Patient currently is not medically stable to d/c.  Persistent encephalopathy   Difficult to place patient No   Level of care: Progressive Cardiac  Consultants:  Neurology Palliative care Infectious disease  Procedures:  NG tube placement 9/21  Antimicrobials:  Ceftazidime   Subjective: Pt went for paracentesis.  Was very sleepy after the procedure.   Objective: Vitals:   12/02/20 0405 12/02/20 0741 12/02/20 1300 12/02/20 1415  BP:  (!) 96/48 (!) 96/57 90/61  Pulse:  79 87 87  Resp:  17    Temp:  98 F (36.7 C)    TempSrc:  Axillary    SpO2:  98% 99% 99%  Weight: 62.4 kg     Height:        Intake/Output Summary (Last 24 hours) at 12/02/2020 1619 Last data filed at 12/02/2020 1325 Gross per 24 hour  Intake 1400 ml  Output 825 ml  Net 575 ml   Filed Weights   11/30/20 0423 12/01/20 0600 12/02/20 0405  Weight: 61 kg 62 kg 62.4 kg    Examination:  Constitutional: NAD, sleeping, hard to wake CV: No cyanosis.   RESP: normal respiratory effort, on RA Extremities: No effusions, edema in BLE SKIN: warm, dry Foley present, dried blood around penis   Data Reviewed: I have personally reviewed following labs and imaging studies  CBC: Recent Labs  Lab 11/27/20 0621 11/29/20 1229 12/02/20 0448  WBC 5.7 8.5 5.0  NEUTROABS  --  5.8  --   HGB 10.5* 9.7* 10.2*  HCT 31.7* 29.8* 31.1*  MCV 95.5 99.0 98.1  PLT 154 124*  124*   Basic Metabolic Panel: Recent Labs  Lab 11/26/20 0105 11/27/20 0621 11/29/20 1229 12/02/20 0448  NA 126* 135 136 136  K 3.9 3.8 4.0 3.9  CL 102 104 110 105  CO2 21* 22 21* 22  GLUCOSE 98 84 118* 100*  BUN 8 7* 7* 8  CREATININE 0.50* 0.42* 0.49* 0.55*  CALCIUM 8.1* 8.3* 8.1* 8.4*  MG 2.0  --   --  1.4*  PHOS 2.5  --   --   --    GFR: Estimated Creatinine Clearance: 80.2 mL/min (A) (by C-G formula based on SCr of 0.55 mg/dL (L)). Liver Function Tests: Recent Labs  Lab 11/26/20 0105  AST 39  ALT 22  ALKPHOS 78  BILITOT 1.0  PROT 6.3*  ALBUMIN 2.0*   No results for input(s): LIPASE, AMYLASE in the last 168 hours. No results for input(s): AMMONIA in the last 168 hours.  Coagulation Profile: No results for input(s): INR, PROTIME in the last 168 hours. Cardiac Enzymes: No results for input(s): CKTOTAL, CKMB, CKMBINDEX, TROPONINI in the last 168 hours. BNP (last 3 results) No results for input(s): PROBNP in the last 8760 hours. HbA1C: No results for input(s): HGBA1C in the last 72 hours. CBG: Recent Labs  Lab  12/01/20 1927 12/02/20 0018 12/02/20 0401 12/02/20 0734 12/02/20 1142  GLUCAP 101* 93 94 101* 114*   Lipid Profile: No results for input(s): CHOL, HDL, LDLCALC, TRIG, CHOLHDL, LDLDIRECT in the last 72 hours. Thyroid Function Tests: No results for input(s): TSH, T4TOTAL, FREET4, T3FREE, THYROIDAB in the last 72 hours. Anemia Panel: No results for input(s): VITAMINB12, FOLATE, FERRITIN, TIBC, IRON, RETICCTPCT in the last 72 hours.  Sepsis Labs: Recent Labs  Lab 11/29/20 1229  PROCALCITON 0.31     No results found for this or any previous visit (from the past 240 hour(s)).        Radiology Studies: US Paracentesis  Result Date: 12/02/2020 INDICATION: Ascites secondary to alcoholic cirrhosis. Request for diagnostic and therapeutic paracentesis. EXAM: ULTRASOUND GUIDED PARACENTESIS MEDICATIONS: 1% lidocaine 10 mL COMPLICATIONS: None  immediate. PROCEDURE: Informed written consent was obtained from the patient after a discussion of the risks, benefits and alternatives to treatment. A timeout was performed prior to the initiation of the procedure. Initial ultrasound scanning demonstrates a large amount of ascites within the left lateral abdomen. The left lateral abdomen was prepped and draped in the usual sterile fashion. 1% lidocaine was used for local anesthesia. Following this, a 6 Fr Safe-T-Centesis catheter was introduced. An ultrasound image was saved for documentation purposes. The paracentesis was performed. The catheter was removed and a dressing was applied. The patient tolerated the procedure well without immediate post procedural complication. FINDINGS: A total of approximately 1.8 L of clear yellow fluid was removed. Samples were sent to the laboratory as requested by the clinical team. IMPRESSION: Successful ultrasound-guided paracentesis yielding 1.8 liters of peritoneal fluid. Read by: Corrin Parker, PA-C Electronically Signed   By: Irish Lack M.D.   On: 12/02/2020 14:20   [place] ASCITES (ABDOMEN LIMITED)  Result Date: 12/01/2020 CLINICAL DATA:  Abdominal distension EXAM: LIMITED ABDOMEN ULTRASOUND FOR ASCITES TECHNIQUE: Limited ultrasound survey for ascites was performed in all four abdominal quadrants. COMPARISON:  None. FINDINGS: Moderate amount ascites. Largest fluid pocket is seen in the right lower quadrant. Cirrhotic morphology of the liver IMPRESSION: Moderate ascites Electronically Signed   By: Jasmine Pang M.D.   On: 12/01/2020 16:45        Scheduled Meds:  budesonide (PULMICORT) nebulizer solution  0.25 mg Nebulization BID   Chlorhexidine Gluconate Cloth  6 each Topical Daily   enoxaparin (LOVENOX) injection  40 mg Subcutaneous Q24H   feeding supplement (NEPRO CARB STEADY)  237 mL Oral TID BM   folic acid  1 mg Oral Daily   haloperidol lactate  2.5 mg Intravenous Once   mouth rinse  15 mL Mouth Rinse BID    multivitamin with minerals  1 tablet Oral Daily   pantoprazole  40 mg Oral Q24H   thiamine  100 mg Oral Daily   Continuous Infusions:  sodium chloride 250 mL (11/30/20 1431)     LOS: 18 days     Darlin Priestly, MD [facility] Pager [phone]  If 7PM-7AM, please contact night-coverage 12/02/2020, 4:19 PM

## 2020-12-02 NOTE — Progress Notes (Signed)
PT Cancellation Note  Patient Details Name: Dustin Vazquez MRN: [id number] DOB: 1955-10-10   Cancelled Treatment:    Reason Eval/Treat Not Completed: Fatigue/lethargy limiting ability to participate Order received and chart reviewed for evaluation. Attempted to evaluate patient however, reporting some increased fatigue after his paracentesis procedure and requesting we come back tomorrow. Will attempt tomorrow.  Verl Blalock, SPT    Verl Blalock 12/02/2020, 3:31 PM

## 2020-12-02 NOTE — Progress Notes (Signed)
Patient received from US/paracentesis.  Patient is sleeping, breathing easy, unlabored.  LLQ paracentesis site with gauze, CDI.  No distress, will monitor.

## 2020-12-02 NOTE — Procedures (Signed)
PROCEDURE SUMMARY:  Successful US guided paracentesis from left lateral abdomen.  Yielded 1.8 liters of clear yellow fluid.  No immediate complications.  Patient tolerated well.  EBL = trace  Specimen was sent for labs.  Icelyn Navarrete S Audryna Wendt PA-C 12/02/2020 2:20 PM

## 2020-12-02 NOTE — Progress Notes (Signed)
Mobility Specialist - Progress Note   12/02/20 1000  Mobility  Activity Stood at bedside  Range of Motion/Exercises Right leg;Left leg  Level of Assistance Moderate assist, patient does 50-74%  Assistive Device Front wheel walker  Distance Ambulated (ft) 0 ft  Mobility Sit up in bed/chair position for meals  Mobility Response Tolerated well  Mobility performed by Mobility specialist  $Mobility charge 1 Mobility    Pre-mobility: 84 HR, 96% SpO2 Post-mobility: 88 HR, 90% SpO2   Pt sitting in recliner upon arrival, utilizing RA. Aox2 to self and location. Confused. Participated in seated AAROM therex. ModA + vc to stand to RW. Not safe to progress to ambulation as pt demonstrated difficulty holding upright position. Pt returned to chair with alarm set.    Filiberto Pinks Mobility Specialist 12/02/20, 10:44 AM

## 2020-12-02 NOTE — Progress Notes (Signed)
   11/29/20 0425  Assess: MEWS Score  Temp 100.1 F (37.8 C)  BP (!) 85/60  Pulse Rate (!) 148  ECG Heart Rate (!) 148  Resp (!) 22  Level of Consciousness Alert  SpO2 97 %  O2 Device Room Air  Assess: MEWS Score  MEWS Temp 0  MEWS Systolic 1  MEWS Pulse 3  MEWS RR 1  MEWS LOC 0  MEWS Score 5  MEWS Score Color Red  Assess: if the MEWS score is Yellow or Red  Were vital signs taken at a resting state? Yes  Focused Assessment Change from prior assessment (see assessment flowsheet)  Does the patient meet 2 or more of the SIRS criteria? No  MEWS guidelines implemented *See Row Information* Yes  Treat  MEWS Interventions Escalated (See documentation below)  Take Vital Signs  Increase Vital Sign Frequency  Red: Q 1hr X 4 then Q 4hr X 4, if remains red, continue Q 4hrs  Escalate  MEWS: Escalate Red: discuss with charge nurse/RN and provider, consider discussing with RRT  Notify: Charge Nurse/RN  Name of Charge Nurse/RN Notified Paulino Rily RN  Date Charge Nurse/RN Notified 11/29/20  Time Charge Nurse/RN Notified 0425  Notify: Provider  Provider Name/Title Hannah Beat MD  Date Provider Notified 11/29/20  Time Provider Notified [phone]  Notification Type Page  Notification Reason Change in status  Provider response See new orders  Assess: SIRS CRITERIA  SIRS Temperature  0  SIRS Pulse 1  SIRS Respirations  1  SIRS WBC 0  SIRS Score Sum  2  Inserted for Vonda Antigua RN

## 2020-12-02 NOTE — Plan of Care (Signed)
  Problem: Clinical Measurements: Goal: Ability to maintain clinical measurements within normal limits will improve Outcome: Progressing   Problem: Clinical Measurements: Goal: Will remain free from infection Outcome: Progressing   Problem: Activity: Goal: Risk for activity intolerance will decrease Outcome: Progressing   Problem: Safety: Goal: Ability to remain free from injury will improve Outcome: Progressing   Problem: Skin Integrity: Goal: Risk for impaired skin integrity will decrease Outcome: Progressing

## 2020-12-02 NOTE — Evaluation (Signed)
Occupational Therapy Evaluation Patient Details Name: Dustin Vazquez MRN: [id number] DOB: [birth year] Today's Date: 12/02/2020   History of Present Illness Pt is a 65 y/o M with PMH: COPD and suspected alcohol abuse. He was brought to [facility] ED after being found on the side of the road with altered mental status. Pt was found to be hypotensive and have elevated creatinine. He was admitted with septic shock. Pt was found to have acute lower UTI, was hypovolemic, and noted to have hematuria and retention. coud catheter was placed by urology. Pt encephalopathic, but appears to be improving as he is now afebrile.   Clinical Impression   Pt seen for OT evaluation this date in setting of acute hospitalization d/t sepsis. Pt presents this date with improving mentation versus earlier documentation of his hospital stay, however, he is still unable to provide reliable PLOF information. Chart indicates that pt's son reported that pt is able to perform all ADLs at baseline. Pt presents this date with decreased safety awareness, decreased strength, and decreased fxl activity tolerance. Pt requires MIN to MOD A with RW for ADL transfers, MOD A for LB ADLs and SETUP to MIN A for UB ADLs as well as sequencing cues throughout. Pt returned to bed at end of session in prep for paracentesis. RN aware of session contents. Will continue to follow acutely. Anticipate pt will require STR f/u OT services to improve strength and balance.      Recommendations for follow up therapy are one component of a multi-disciplinary discharge planning process, led by the attending physician.  Recommendations may be updated based on patient status, additional functional criteria and insurance authorization.   Follow Up Recommendations  SNF    Equipment Recommendations  3 in 1 bedside commode;Tub/shower seat;Other (comment) (2ww)    Recommendations for Other Services       Precautions / Restrictions Precautions Precautions:  Fall Restrictions Weight Bearing Restrictions: No      Mobility Bed Mobility Overal bed mobility: Needs Assistance Bed Mobility: Sit to Supine       Sit to supine: Min assist;Mod assist        Transfers Overall transfer level: Needs assistance Equipment used: Rolling walker (2 wheeled) Transfers: Sit to/from [employer] Sit to Stand: Min assist Stand pivot transfers: Mod assist       General transfer comment: increased time and cues for safety including tactile cues-placing pt's hands in safest position for transfers with RW    Balance Overall balance assessment: Needs assistance Sitting-balance support: Feet supported Sitting balance-Leahy Scale: Good   Postural control: Posterior lean Standing balance support: Bilateral upper extremity supported Standing balance-Leahy Scale: Poor Standing balance comment: requires UE support as well as MIN A to sustain static stand. One instance of near posterior LOB to chair while static standing with OT assisting with peri care.                           ADL either performed or assessed with clinical judgement   ADL                                         General ADL Comments: requires SETUP to MIN A for seated UB ADLs. MOD A for seated LB ADLs, MOD/MAX A for standing LB ADLs such as peri care. Pt with P standing balance requiring assist  throughout as well as RW for UE support. Requires cues for initiation, safety and sequencing, at least minimally, throughout session.     Vision   Additional Comments: unsure baseline, difficult to assess d/t cognition. Pt appears to track appropriately.     Perception     Praxis      Pertinent Vitals/Pain Pain Assessment: Faces Faces Pain Scale: Hurts a little bit Pain Location: "my back sometimes" Pain Descriptors / Indicators: Grimacing Pain Intervention(s): Monitored during session     Hand Dominance     Extremity/Trunk Assessment  Upper Extremity Assessment Upper Extremity Assessment: Generalized weakness   Lower Extremity Assessment Lower Extremity Assessment: Generalized weakness       Communication Communication Communication: Other (comment) (garbled speech as well as word salad at times. Can be difficult to understand.)   Cognition Arousal/Alertness: Awake/alert Behavior During Therapy: WFL for tasks assessed/performed Overall Cognitive Status: No family/caregiver present to determine baseline cognitive functioning                                 General Comments: Pt is able to follow >75% simple one step commands with increased time for processing. He is oriented to self only. Unsure of PLOF. He is somewhat confused and his speech can be garbled and difficult to understand at times. Occasional word salad. Mentions more than once "I have never hurt a woman"   General Comments       Exercises Other Exercises Other Exercises: OT ed with pt re: role and safety with RW use. Pt with MIN/MOD reception, poor carryover. Will require continued education.   Shoulder Instructions      Home Living Family/patient expects to be discharged to:: Unsure                                 Additional Comments: Pt appears to be homeless per chart review. He is unable to provide adequate information 2/2 confusion.      Prior Functioning/Environment          Comments: unsure of PLOF as pt is poor historian. Chart indicates that pt's son reported he is INDEP with ADLs, but does not drive. Will f/u with family as able.        OT Problem List: Decreased strength;Decreased activity tolerance;Decreased safety awareness;Decreased knowledge of use of DME or AE      OT Treatment/Interventions: Self-care/ADL training;Therapeutic exercise;Therapeutic activities;DME and/or AE instruction    OT Goals(Current goals can be found in the care plan section) Acute Rehab OT Goals Patient Stated Goal: none  stated OT Goal Formulation: Patient unable to participate in goal setting Time For Goal Achievement: 12/16/20 Potential to Achieve Goals: Good ADL Goals Pt Will Perform Lower Body Dressing: with supervision Pt Will Transfer to Toilet: with supervision Pt Will Perform Toileting - Clothing Manipulation and hygiene: with supervision  OT Frequency: Min 1X/week   Barriers to D/C:            Co-evaluation              AM-PAC OT "6 Clicks" Daily Activity     Outcome Measure Help from another person eating meals?: None Help from another person taking care of personal grooming?: A Little Help from another person toileting, which includes using toliet, bedpan, or urinal?: A Lot Help from another person bathing (including washing, rinsing, drying)?: A Little Help  from another person to put on and taking off regular upper body clothing?: A Little Help from another person to put on and taking off regular lower body clothing?: A Lot 6 Click Score: 17   End of Session Equipment Utilized During Treatment: Gait belt;Rolling walker Nurse Communication: Mobility status  Activity Tolerance: Patient tolerated treatment well Patient left: in bed;with call bell/phone within reach;with bed alarm set  OT Visit Diagnosis: Unsteadiness on feet (R26.81);Muscle weakness (generalized) (M62.81)                Time: 1610-9604 OT Time Calculation (min): 27 min Charges:  OT General Charges $OT Visit: 1 Visit OT Evaluation $OT Eval Moderate Complexity: 1 Mod OT Treatments $Self Care/Home Management : 8-22 mins  Rejeana Brock, MS, OTR/L ascom [phone] 12/02/20, 6:33 PM

## 2020-12-03 DIAGNOSIS — B965 Pseudomonas (aeruginosa) (mallei) (pseudomallei) as the cause of diseases classified elsewhere: Secondary | ICD-10-CM | POA: Diagnosis not present

## 2020-12-03 DIAGNOSIS — R7881 Bacteremia: Secondary | ICD-10-CM | POA: Diagnosis not present

## 2020-12-03 DIAGNOSIS — K7030 Alcoholic cirrhosis of liver without ascites: Secondary | ICD-10-CM | POA: Diagnosis not present

## 2020-12-03 DIAGNOSIS — G9341 Metabolic encephalopathy: Secondary | ICD-10-CM | POA: Diagnosis not present

## 2020-12-03 DIAGNOSIS — D696 Thrombocytopenia, unspecified: Secondary | ICD-10-CM

## 2020-12-03 LAB — GLUCOSE, CAPILLARY
Glucose-Capillary: 101 mg/dL — ABNORMAL HIGH (ref 70–99)
Glucose-Capillary: 102 mg/dL — ABNORMAL HIGH (ref 70–99)
Glucose-Capillary: 110 mg/dL — ABNORMAL HIGH (ref 70–99)
Glucose-Capillary: 169 mg/dL — ABNORMAL HIGH (ref 70–99)
Glucose-Capillary: 91 mg/dL (ref 70–99)
Glucose-Capillary: 93 mg/dL (ref 70–99)
Glucose-Capillary: 97 mg/dL (ref 70–99)

## 2020-12-03 LAB — PROTEIN, BODY FLUID (OTHER): Total Protein, Body Fluid Other: 1 g/dL

## 2020-12-03 LAB — MAGNESIUM: Magnesium: 1.8 mg/dL (ref 1.7–2.4)

## 2020-12-03 NOTE — TOC Initial Note (Signed)
Transition of Care [facility]) - Initial/Assessment Note    Patient Details  Name: Dustin Vazquez MRN: [id number] Date of Birth: Nov 21, 1955  Transition of Care [facility]) CM/SW Contact:    Gildardo Griffes, LCSW Phone Number: 12/03/2020, 10:49 AM  Clinical Narrative:                  CSW spoke with patient's son [name] to solidify a discharge plan for patient.   [name] reports prior to hospital admission patient was living in his home (cam[place]) on property her owns. Patient was living alone independently. [name] recognizes patient is unable to return home alone upon discharge from the hospital, and is in agreement with SNF recommendation.   CSW has faxed out referrals pending bed offer at this time.     Expected Discharge Plan: Skilled Nursing Facility Barriers to Discharge: Continued Medical Work up   Patient Goals and CMS Choice Patient states their goals for this hospitalization and ongoing recovery are:: to go home CMS Medicare.gov Compare Post Acute Care list provided to:: Patient Represenative (must comment) (son [name]) Choice offered to / list presented to : Adult Children  Expected Discharge Plan and Services Expected Discharge Plan: Skilled Nursing Facility In-house Referral: Clinical Social Work     Living arrangements for the past 2 months: Single Family Home                                      Prior Living Arrangements/Services Living arrangements for the past 2 months: Single Family Home Lives with:: Self Patient language and need for interpreter reviewed:: Yes Do you feel safe going back to the place where you live?: Yes      Need for Family Participation in Patient Care: Yes (Comment) Care giver support system in place?: Yes (comment)   Criminal Activity/Legal Involvement Pertinent to Current Situation/Hospitalization: No - Comment as needed  Activities of Daily Living Home Assistive Devices/Equipment: None ADL Screening (condition at time of  admission) Patient's cognitive ability adequate to safely complete daily activities?: No Is the patient deaf or have difficulty hearing?: No Does the patient have difficulty seeing, even when wearing glasses/contacts?: No Does the patient have difficulty concentrating, remembering, or making decisions?: Yes Patient able to express need for assistance with ADLs?: No Does the patient have difficulty dressing or bathing?: Yes Independently performs ADLs?: No Communication: Independent Dressing (OT): Dependent Grooming: Dependent Feeding: Dependent Bathing: Dependent Toileting: Dependent In/Out Bed: Dependent Walks in Home: Independent Does the patient have difficulty walking or climbing stairs?: No Weakness of Legs: None Weakness of Arms/Hands: None  Permission Sought/Granted Permission sought to share information with : Case Production designer, theatre/television/film, Magazine features editor, Family Supports Permission granted to share information with : Yes, Verbal Permission Granted  Share Information with NAME: [name]  Permission granted to share info w AGENCY: SNFs  Permission granted to share info w Relationship: son  Permission granted to share info w Contact Information: [phone]  Emotional Assessment Appearance:: Appears stated age Attitude/Demeanor/Rapport: Unable to Assess Affect (typically observed): Unable to Assess Orientation: : Oriented to Self Alcohol / Substance Use: Not Applicable Psych Involvement: No (comment)  Admission diagnosis:  Severe sepsis (HCC) [A41.9, R65.20] Severe sepsis with septic shock (CODE) (HCC) [R65.21] Sepsis with acute renal failure and septic shock, due to unspecified organism, unspecified acute renal failure type (HCC) [A41.9, R65.21, N17.9] Patient Active Problem List   Diagnosis Date Noted   Bacteremia  due to Pseudomonas 11/20/2020   Alcoholic cirrhosis of liver without ascites (HCC) 11/20/2020   Acute metabolic encephalopathy 11/20/2020    Thrombocytopenia (HCC) 11/20/2020   Acute urinary retention 11/20/2020   Hematuria 11/20/2020   Acute lower UTI 11/20/2020   Protein-calorie malnutrition, severe 11/16/2020   Severe sepsis (HCC) 11/14/2020   Severe sepsis with septic shock (CODE) (HCC) 11/14/2020   PCP:  [facility]:  No Pharmacies Listed    Social Determinants of Health (SDOH) Interventions    Readmission Risk Interventions No flowsheet data found.
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
## 2020-12-03 NOTE — NC FL2 (Signed)
[place] MEDICAID FL2 LEVEL OF CARE SCREENING TOOL     IDENTIFICATION  Patient Name: Dustin Vazquez Birthdate: 07-01-1955 Sex: male Admission Date (Current Location): 11/14/2020  [facility] and [place] Number:  Chiropodist and Address:  [facility], [address], [place], [place] [address]      Provider Number: [id number]  Attending Physician Name and Address:  Darlin Priestly, MD  Relative Name and Phone Number:  [name] (son) [phone]    Current Level of Care: Hospital Recommended Level of Care: Skilled Nursing Facility Prior Approval Number:    Date Approved/Denied:   PASRR Number: [id number]  Discharge Plan: SNF    Current Diagnoses: Patient Active Problem List   Diagnosis Date Noted   Bacteremia due to Pseudomonas 11/20/2020   Alcoholic cirrhosis of liver without ascites (HCC) 11/20/2020   Acute metabolic encephalopathy 11/20/2020   Thrombocytopenia (HCC) 11/20/2020   Acute urinary retention 11/20/2020   Hematuria 11/20/2020   Acute lower UTI 11/20/2020   Protein-calorie malnutrition, severe 11/16/2020   Severe sepsis (HCC) 11/14/2020   Severe sepsis with septic shock (CODE) (HCC) 11/14/2020    Orientation RESPIRATION BLADDER Height & Weight     Self  Normal Incontinent, External catheter Weight: 132 lb 11.5 oz (60.2 kg) Height:  5\' 6"  (167.6 cm)  BEHAVIORAL SYMPTOMS/MOOD NEUROLOGICAL BOWEL NUTRITION STATUS      Incontinent Diet (see discharge summary)  AMBULATORY STATUS COMMUNICATION OF NEEDS Skin   Extensive Assist Verbally Other (Comment) (abrasions hips)                       Personal Care Assistance Level of Assistance  Bathing, Feeding, Dressing, Total care Bathing Assistance: Maximum assistance Feeding assistance: Maximum assistance Dressing Assistance: Maximum assistance Total Care Assistance: Maximum assistance   Functional Limitations Info  Sight, Hearing, Speech Sight Info: Adequate Hearing  Info: Adequate Speech Info: Adequate    SPECIAL CARE FACTORS FREQUENCY  PT (By licensed PT), OT (By licensed OT)     PT Frequency: min 4x weekly OT Frequency: min 4x weekly            Contractures Contractures Info: Not present    Additional Factors Info  Code Status, Allergies Code Status Info: DNR Allergies Info: Trazodone           Current Medications (12/03/2020):  This is the current hospital active medication list Current Facility-Administered Medications  Medication Dose Route Frequency Provider Last Rate Last Admin   0.9 %  sodium chloride infusion  250 mL Intravenous Continuous Mansy, Jan A, MD 10 mL/hr at 11/30/20 1431 250 mL at 11/30/20 1431   acetaminophen (TYLENOL) tablet 650 mg  650 mg Oral Q6H PRN Mansy, Jan A, MD   650 mg at 12/02/20 0423   Or   acetaminophen (TYLENOL) suppository 650 mg  650 mg Rectal Q6H PRN Mansy, Jan A, MD       albuterol (PROVENTIL) (2.5 MG/3ML) 0.083% nebulizer solution 3 mL  3 mL Inhalation Q6H PRN Feb, MD       budesonide (PULMICORT) nebulizer solution 0.25 mg  0.25 mg Nebulization BID Darlin Priestly, MD   0.25 mg at 12/03/20 0758   Chlorhexidine Gluconate Cloth 2 % PADS 6 each  6 each Topical Daily 12/05/20, MD   6 each at 12/02/20 0935   enoxaparin (LOVENOX) injection 40 mg  40 mg Subcutaneous Q24H 12/04/20 B, MD   40 mg at 12/02/20 2112   feeding supplement (  NEPRO CARB STEADY) liquid 237 mL  237 mL Oral TID BM Sreenath, Sudheer B, MD 0 mL/hr at 11/25/20 2131 237 mL at 12/03/20 0854   folic acid (FOLVITE) tablet 1 mg  1 mg Oral Daily Darlin Priestly, MD   1 mg at 12/03/20 0854   haloperidol lactate (HALDOL) injection 2.5 mg  2.5 mg Intravenous Once Manuela Schwartz, NP       ipratropium-albuterol (DUONEB) 0.5-2.5 (3) MG/3ML nebulizer solution 3 mL  3 mL Nebulization Q6H PRN Jimmye Norman, NP   3 mL at 12/02/20 0405   magnesium hydroxide (MILK OF MAGNESIA) suspension 30 mL  30 mL Oral Daily PRN Mansy, Jan A, MD        MEDLINE mouth rinse  15 mL Mouth Rinse BID Georgeann Oppenheim, Sudheer B, MD   15 mL at 12/02/20 2211   multivitamin with minerals tablet 1 tablet  1 tablet Oral Daily Lolita Patella B, MD   1 tablet at 12/03/20 0854   ondansetron (ZOFRAN) tablet 4 mg  4 mg Oral Q6H PRN Mansy, Jan A, MD       Or   ondansetron [facility]) injection 4 mg  4 mg Intravenous Q6H PRN Mansy, Jan A, MD       pantoprazole (PROTONIX) EC tablet 40 mg  40 mg Oral Q24H Rauer, Robyne Peers, RPH   40 mg at 12/02/20 2112   thiamine tablet 100 mg  100 mg Oral Daily Lolita Patella B, MD   100 mg at 12/03/20 1448     Discharge Medications: Please see discharge summary for a list of discharge medications.  Relevant Imaging Results:  Relevant Lab Results:   Additional Information SSN: [id number]  Gildardo Griffes, LCSW
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
## 2020-12-03 NOTE — Progress Notes (Signed)
Attempted calling report x 1 

## 2020-12-04 LAB — GLUCOSE, CAPILLARY
Glucose-Capillary: 80 mg/dL (ref 70–99)
Glucose-Capillary: 106 mg/dL — ABNORMAL HIGH (ref 70–99)

## 2020-12-04 NOTE — Progress Notes (Signed)
I spoke with patient's son [name]. He gave permission for me to update the niece Clydie Braun who is visiting today. I will add her to the contact list.
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
## 2020-12-04 NOTE — TOC Progression Note (Signed)
Transition of Care [facility]) - Progression Note    Patient Details  Name: Dustin Vazquez MRN: [id number] Date of Birth: October 25, 1955  [facility] Care [facility]) CM/SW Contact  Caryn Section, RN Phone Number: 12/04/2020, 3:14 PM  Clinical Narrative:   No notification of bed offers today.  TOC to follow    Expected Discharge Plan: Skilled Nursing Facility Barriers to Discharge: Continued Medical Work up  Expected Discharge Plan and Services Expected Discharge Plan: Skilled Nursing Facility In-house Referral: Clinical Social Work     Living arrangements for the past 2 months: Single Family Home                                       Social Determinants of Health (SDOH) Interventions    Readmission Risk Interventions No flowsheet data found.

## 2020-12-04 NOTE — Progress Notes (Addendum)
Speech Language Pathology Treatment: Dysphagia  Patient Details Name: Dustin Vazquez MRN: [id number] DOB: 23-Jun-1955 Today's Date: 12/04/2020 Time: 5400-8676 SLP Time Calculation (min) (ACUTE ONLY): 35 min  Assessment / Plan / Recommendation Clinical Impression  Pt seen for ongoing assessment of swallowing; toleration of recently upgraded diet to thin liquids. He is alert, verbally responsive(mumbled speech at times) and engaged w/ SLP but only when asked direct questions; he continues to exhibit declined Cognitive status; encephalopathy. This can impact his overall awareness/timing of swallow and safety during po tasks which increases risk for aspiration, choking. Pt is on RA; wbc wnl. Missing Most Dentition.   He required min-mod verbal/visual/tactile cues for follow through w/ tasks during po trials, positioning upright in bed. Noted min mumbled speech at times; followed some 1-step commands w/ cues. Oriented to name/self.   Pt explained general aspiration precautions and agreed verbally to the need for following them especially sitting upright for all oral intake -- supported behind the back more for full upright sitting in the chair. He also tends to push/sit back in chair to breath better and to BELCH -- noted chest seemed slightly "barrel" looking in presentation. Pt held Cup to drink/feed himself and required min Prep and verbal cues during self-feeding -- pt was inattentive at times. W/ po's of thin liquids purees, and MINCED foods, no immediate, overt clinical s/s of aspiration were noted; respiratory status remained at his baseline, Rest Breaks utilized; Vocal quality gravely but clear b/t trials, and no cough during trials. Oral phase appeared grossly [facility] for bolus management and oral clearing given Time for full mashing/gumming of the MINCED foods.  BELCHING++.  Suspect impact from both Cognitive decline and respiratory status; pt appears at min risk for aspiration, but this risk can be  reduced w/ a modified consistency diet and when following general aspiration precautions. Pt's status needs monitoring for any possible decline in d/t negative sequelae of aspiration.     Recommend maintain a MINCED foods diet(dysphagia level 2) w/ gravies added to moisten foods; Thin liquids d/t Cognitive decline and missing most Dentition. Recommend general aspiration precautions - Small, Single Sips, Slowly; Pills WHOLE in Puree as able; tray setup and positioning assistance for meals. REFLUX precautions. Support at meals. Education given to pt on general aspiration precautions; foods/consistencies for ease of oral intake d/t missing most Dentition. ST services will sign off at this time w/ MD to re-consult if new needs arise. Recommend modified diet and monitoring at discharge for optimal support during oral intake. Recommend Dietician f/u for nutritional support. Team/NSG updated. Precautions posted at bedside.     HPI HPI: Pt is a 65 y.o. male with hx of COPD brought to ED after pt was found down on the side of the road in the rain. Found to be hypotensive, hypothermic, and with AMS.  Per Dietician, pt has Severe Malnutrition.  Current Dxs: Acute metabolic encephalopathy, Sepsis with septic shock secondary to urinary tract infection; possible ETOH withdrawal d/t h/o ETOH abuse, Alcoholic cirrhosis.  CXR: diffuse bilateral interstitial pulmonary opacity and pulmonary  vascular prominence, most consistent with edema. No focal airspace  opacity.  MRI: No evidence of acute intracranial abnormality.  2. Age advanced generalized atrophy.      SLP Plan  All goals met (pt appears at his baseline for swallowing abilities)      Recommendations for follow up therapy are one component of a multi-disciplinary discharge planning process, led by the attending physician.  Recommendations may be updated based  on patient status, additional functional criteria and insurance authorization.    Recommendations   Diet recommendations: Dysphagia 2 (fine chop);Thin liquid Liquids provided via: Cup;Straw Medication Administration: Whole meds with puree (for safer swallowing) Supervision: Patient able to self feed;Intermittent supervision to cue for compensatory strategies (for verbal cues and follow through) Compensations: Minimize environmental distractions;Slow rate;Small sips/bites;Lingual sweep for clearance of pocketing;Follow solids with liquid Postural Changes and/or Swallow Maneuvers: Out of bed for meals;Seated upright 90 degrees;Upright 30-60 min after meal (REFLUX precautions)                General recommendations:  (Dietician f/u) Oral Care Recommendations: Oral care BID;Oral care before and after PO;Staff/trained caregiver to provide oral care (cognitive decline) Follow up Recommendations: Skilled Nursing facility SLP Visit Diagnosis: Dysphagia, oral phase (R13.11) (Cognitive decline) Plan: All goals met (pt appears at his baseline for swallowing abilities)       [place], [place], [place] Pathologist Rehab Services [phone] [facility]  12/04/2020, 10:43 AM

## 2020-12-05 NOTE — TOC Progression Note (Signed)
Transition of Care [facility]) - Progression Note    Patient Details  Name: Dustin Vazquez MRN: [id number] Date of Birth: November 24, 1955  Transition of Care [facility]) CM/SW Contact  Liliana Cline, LCSW Phone Number: 12/05/2020, 8:59 AM  Clinical Narrative:   Sent referral to SNFs in the HUB.Will follow for bed offers.    Expected Discharge Plan: Skilled Nursing Facility Barriers to Discharge: Continued Medical Work up  Expected Discharge Plan and Services Expected Discharge Plan: Skilled Nursing Facility In-house Referral: Clinical Social Work     Living arrangements for the past 2 months: Single Family Home                                       Social Determinants of Health (SDOH) Interventions    Readmission Risk Interventions No flowsheet data found.

## 2020-12-05 NOTE — Progress Notes (Signed)
   12/05/20 0052 12/05/20 0055  Assess: MEWS Score  Temp 98.4 F (36.9 C)  --   BP 92/61 108/72  Pulse Rate (!) 104 100  Resp 19 20  SpO2 95 % 95 %  O2 Device Room Air Room Air  Assess: MEWS Score  MEWS Temp 0 0  MEWS Systolic 1 0  MEWS Pulse 1 0  MEWS RR 0 0  MEWS LOC 0 0  MEWS Score 2 0  MEWS Score Color Yellow Green  Assess: if the MEWS score is Yellow or Red  Were vital signs taken at a resting state? Yes  --   Focused Assessment No change from prior assessment  --   Does the patient meet 2 or more of the SIRS criteria? No  --   Treat  MEWS Interventions Escalated (See documentation below)  --   Pain Scale 0-10 0-10  Pain Score 0 0  Breathing 0  --   Negative Vocalization 0  --   Facial Expression 0  --   Body Language 0  --   Consolability 0  --   PAINAD Score 0  --   Take Vital Signs  Increase Vital Sign Frequency   (vitals rechecked)  --   Escalate  MEWS: Escalate Yellow: discuss with charge nurse/RN and consider discussing with provider and RRT  --   Notify: Charge Nurse/RN  Name of Charge Nurse/RN Notified Marny Lowenstein  --   Date Charge Nurse/RN Notified 12/05/20  --   Time Charge Nurse/RN Notified 0052  --   Assess: SIRS CRITERIA  SIRS Temperature  0 0  SIRS Pulse 1 1  SIRS Respirations  0 0  SIRS WBC 0 0  SIRS Score Sum  1 1

## 2020-12-05 NOTE — Progress Notes (Signed)
PROGRESS NOTE    Dustin Vazquez  OEV:[id number] DOB: 1955-12-28 DOA: 11/14/2020 PCP: [facility]   Brief Narrative:  65 year old male is brought to the hospital after he was found on the side of the road with altered mental status.  Noted to be hypotensive, hypothermic on arrival with elevated creatinine, liver enzymes and lactic acid.  Noted to have urinary tract infection and was admitted to the hospital for septic shock requiring vasopressors.  He was admitted to the ICU for further management.  Overall hemodynamics improved with IV fluids. Transferred to [facility] on 9/14. Found to have 1/4 blood cultures positive for pseudomonas. He has been on IV antibiotics. He has had prolonged hospital course and remains encephalopathic. Neurology consulted for further input. Palliative care following for goals of care.  9/21: Patient remains encephalopathic.  Discussed with RD, plan to place Dobbhoff and initiate nasogastric tube feeds.  9/22: Patient had Dobbhoff in place.  Placement was confirmed on KUB into position for initiated.  This morning patient much more awake.  Dobbhoff was discontinued at some point was laying by the patient's bedside.  As patient is much more awake will reengage SLP for reevaluation.  Patient cleared for dysphagia diet.  9/26: Patient was noted to become more tachycardic overnight with a low-grade temperature.  Cross cover informed.  Patient was found to be in rapid atrial fibrillation.  Started on amiodarone gtt. with improvement in rate.   Assessment & Plan:   Active Problems:   Severe sepsis (HCC)   Severe sepsis with septic shock (CODE) (HCC)   Protein-calorie malnutrition, severe   Bacteremia due to Pseudomonas   Alcoholic cirrhosis of liver without ascites (HCC)   Acute metabolic encephalopathy   Thrombocytopenia (HCC)   Acute urinary retention   Hematuria   Acute lower UTI  Sepsis with septic shock secondary to urinary tract  infection Pseudomonas bacteremia -He has been weaned off of vasopressors -Initially on Unasyn, subsequently transition to cefepime for Pseudomonas coverage -Based on culture sensitivities, cefepime changed to ceftazidime -Urine culture positive for staph epidermidis -1 out of 4 blood cultures positive for Pseudomonas --finished course of abx with [place].  New onset atrial fibrillation --may be triggered by infection --started on amio gtt, now converted and rate controlled, amio gtt d/c'ed on 9/28 --Monitor off of oral rate control agent   Acute metabolic encephalopathy -Etiology is not entirely clear -May have a component related to underlying infection, although has been afebrile for several days now -He does have a history of alcoholism and may have a component of Wernicke's, he has been receiving high-dose thiamine -Ammonia level normal, TSH normal  -CT head negative x2 -MRI brain unremarkable -EEG without evidence of epileptiform discharges -Overall electrolyte abnormalities have corrected -No further recommendations from neurology standpoint -Encephalopathy improved as of 9/22, but still confused -Dobbhoff was placed but was discontinued at some point on 9/21-9/22 Plan: Continue p.o. thiamine 100 mg daily Dysphagia 2 diet Nutrition follow-up  History of alcohol abuse -He has not required any benzodiazepines in several days   Alcoholic cirrhosis -Continue to follow LFTs --US abdomen found mod ascites, s/p paracentesis on 9/29 with 1.8L removed, neg SBP   Thrombocytopenia -Likely multifactorial, related to acute infectious process/recent alcohol use/cirrhosis -platelet count has started to trend up   Acute kidney injury, resolved -Cr 2.07 on presentation.  Secondary to hypovolemia --AKI resolved after IVF, back to baseline ~0.5   Urinary retention -Noted to have significant hematuria -Urology consulted and coud catheter  was placed --Coude cath exchanged on 9/28    COPD -Continue bronchodilators and inhaled steroids   Goals of care -Patient was living independently in a camper -Discussed CODE STATUS with patient's son who confirmed DNR -He is overall malnourished and may have difficulty making a meaningful recovery -Palliative care consulted to further address goals of care  Protein-calorie malnutrition, severe --Nepro TID   DVT prophylaxis: SCDs Code Status: DNR Family Communication:  Disposition Plan: Status is: Inpatient  Dispo: The patient is from: Home              Anticipated d/c is to: SNF              Patient currently is medically stable to d/c.   Level of care: Med-Surg  Consultants:  Neurology Palliative care Infectious disease  Procedures:  NG tube placement 9/21  Antimicrobials:  Ceftazidime   Subjective: Awake, but confused and nonsensical speech.   Objective: Vitals:   12/05/20 0753 12/05/20 0830 12/05/20 1216 12/05/20 1337  BP:  120/76 (!) 83/57 109/67  Pulse:  90 98 99  Resp:  20 19   Temp:   98.7 F (37.1 C)   TempSrc:   Oral   SpO2: 95% 92% 96% 100%  Weight:      Height:        Intake/Output Summary (Last 24 hours) at 12/05/2020 1608 Last data filed at 12/05/2020 1220 Gross per 24 hour  Intake 237 ml  Output 450 ml  Net -213 ml   Filed Weights   12/01/20 0600 12/02/20 0405 12/03/20 0327  Weight: 62 kg 62.4 kg 60.2 kg    Examination:  Constitutional: NAD, alert, not oriented, confused HEENT: conjunctivae and lids normal, EOMI CV: No cyanosis.   RESP: normal respiratory effort, on RA Extremities: No effusions, edema in BLE SKIN: warm, dry  Foley present  Data Reviewed: I have personally reviewed following labs and imaging studies  CBC: Recent Labs  Lab 11/29/20 1229 12/02/20 0448  WBC 8.5 5.0  NEUTROABS 5.8  --   HGB 9.7* 10.2*  HCT 29.8* 31.1*  MCV 99.0 98.1  PLT 124* 124*   Basic Metabolic Panel: Recent Labs  Lab 11/29/20 1229 12/02/20 0448 12/03/20 0629  NA 136  136  --   K 4.0 3.9  --   CL 110 105  --   CO2 21* 22  --   GLUCOSE 118* 100*  --   BUN 7* 8  --   CREATININE 0.49* 0.55*  --   CALCIUM 8.1* 8.4*  --   MG  --  1.4* 1.8   GFR: Estimated Creatinine Clearance: 77.3 mL/min (A) (by C-G formula based on SCr of 0.55 mg/dL (L)). Liver Function Tests: No results for input(s): AST, ALT, ALKPHOS, BILITOT, PROT, ALBUMIN in the last 168 hours.  No results for input(s): LIPASE, AMYLASE in the last 168 hours. No results for input(s): AMMONIA in the last 168 hours.  Coagulation Profile: No results for input(s): INR, PROTIME in the last 168 hours. Cardiac Enzymes: No results for input(s): CKTOTAL, CKMB, CKMBINDEX, TROPONINI in the last 168 hours. BNP (last 3 results) No results for input(s): PROBNP in the last 8760 hours. HbA1C: No results for input(s): HGBA1C in the last 72 hours. CBG: Recent Labs  Lab 12/03/20 1144 12/03/20 1647 12/03/20 1919 12/04/20 0759 12/04/20 1237  GLUCAP 110* 169* 101* 80 106*   Lipid Profile: No results for input(s): CHOL, HDL, LDLCALC, TRIG, CHOLHDL, LDLDIRECT in the last 72 hours. Thyroid  Function Tests: No results for input(s): TSH, T4TOTAL, FREET4, T3FREE, THYROIDAB in the last 72 hours. Anemia Panel: No results for input(s): VITAMINB12, FOLATE, FERRITIN, TIBC, IRON, RETICCTPCT in the last 72 hours.  Sepsis Labs: Recent Labs  Lab 11/29/20 1229  PROCALCITON 0.31     No results found for this or any previous visit (from the past 240 hour(s)).        Radiology Studies: No results found.      Scheduled Meds:  budesonide (PULMICORT) nebulizer solution  0.25 mg Nebulization BID   Chlorhexidine Gluconate Cloth  6 each Topical Daily   enoxaparin (LOVENOX) injection  40 mg Subcutaneous Q24H   feeding supplement (NEPRO CARB STEADY)  237 mL Oral TID BM   folic acid  1 mg Oral Daily   mouth rinse  15 mL Mouth Rinse BID   multivitamin with minerals  1 tablet Oral Daily   pantoprazole  40 mg  Oral Q24H   thiamine  100 mg Oral Daily   Continuous Infusions:     LOS: 21 days     Darlin Priestly, MD [facility] Pager [phone]  If 7PM-7AM, please contact night-coverage 12/05/2020, 4:08 PM

## 2020-12-06 LAB — RESP PANEL BY RT-PCR (FLU A&B, COVID) ARPGX2
Influenza A by PCR: NEGATIVE
Influenza B by PCR: NEGATIVE
SARS Coronavirus 2 by RT PCR: NEGATIVE

## 2020-12-06 LAB — CYTOLOGY - NON PAP
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
## 2020-12-06 NOTE — TOC Progression Note (Signed)
Transition of Care [facility]) - Progression Note    Patient Details  Name: Dustin Vazquez MRN: [id number] Date of Birth: 1955-10-01  [facility] CM/SW Contact  Allayne Butcher, RN Phone Number: 12/06/2020, 4:27 PM  Clinical Narrative:    [facility] has offered a bed for short term rehab.  Message left for patient's son to return call to discuss discharge.  Bed offer accepted as it is the only bed offer.  Plan for DC tomorrow.     Expected Discharge Plan: Skilled Nursing Facility Barriers to Discharge: Continued Medical Work up  Expected Discharge Plan and Services Expected Discharge Plan: Skilled Nursing Facility In-house Referral: Clinical Social Work     Living arrangements for the past 2 months: Single Family Home                                       Social Determinants of Health (SDOH) Interventions    Readmission Risk Interventions No flowsheet data found.
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
## 2020-12-07 LAB — BASIC METABOLIC PANEL
Anion gap: 6 (ref 5–15)
BUN: 8 mg/dL (ref 8–23)
CO2: 26 mmol/L (ref 22–32)
Calcium: 8.2 mg/dL — ABNORMAL LOW (ref 8.9–10.3)
Chloride: 105 mmol/L (ref 98–111)
Creatinine, Ser: 0.59 mg/dL — ABNORMAL LOW (ref 0.61–1.24)
GFR, Estimated: >60 mL/min (ref >60)
Glucose, Bld: 100 mg/dL — ABNORMAL HIGH (ref 70–99)
Potassium: 4 mmol/L (ref 3.5–5.1)
Sodium: 137 mmol/L (ref 135–145)

## 2020-12-07 LAB — CBC
HCT: 28.5 % — ABNORMAL LOW (ref 39.0–52.0)
Hemoglobin: 9.7 g/dL — ABNORMAL LOW (ref 13.0–17.0)
MCH: 32.7 pg (ref 26.0–34.0)
MCHC: 34 g/dL (ref 30.0–36.0)
MCV: 96 fL (ref 80.0–100.0)
Platelets: 219 10*3/uL (ref 150–400)
RBC: 2.97 MIL/uL — ABNORMAL LOW (ref 4.22–5.81)
RDW: 15.2 % (ref 11.5–15.5)
WBC: 5.8 10*3/uL (ref 4.0–10.5)
nRBC: 0 % (ref 0.0–0.2)

## 2020-12-07 LAB — MAGNESIUM: Magnesium: 1.4 mg/dL — ABNORMAL LOW (ref 1.7–2.4)

## 2020-12-07 MED ORDER — THIAMINE HCL 100 MG PO TABS: 100.0000 mg | ORAL_TABLET | Freq: Every day | ORAL | Status: AC

## 2020-12-07 MED ORDER — ADULT MULTIVITAMIN W/MINERALS CH: 1.0000 | ORAL_TABLET | Freq: Every day | ORAL | Status: AC

## 2020-12-07 MED ORDER — PANTOPRAZOLE SODIUM 40 MG PO TBEC: 40.0000 mg | DELAYED_RELEASE_TABLET | ORAL | Status: AC

## 2020-12-07 MED ORDER — MAGNESIUM OXIDE -MG SUPPLEMENT 200 MG PO TABS: 400.0000 mg | ORAL_TABLET | Freq: Two times a day (BID) | ORAL | 0 refills | Status: AC

## 2020-12-07 MED ORDER — MAGNESIUM SULFATE 4 GM/100ML IV SOLN
4.0000 g | Freq: Once | INTRAVENOUS | Status: AC
  Administered 2020-12-07: 4 g via INTRAVENOUS
  Filled 2020-12-07 (×2): qty 100

## 2020-12-07 MED ORDER — FOLIC ACID 1 MG PO TABS: 1.0000 mg | ORAL_TABLET | Freq: Every day | ORAL | Status: AC

## 2020-12-07 MED ORDER — NEPRO/CARBSTEADY PO LIQD: 237.0000 mL | Freq: Three times a day (TID) | ORAL | 0 refills | Status: AC

## 2020-12-07 MED ORDER — ALBUTEROL SULFATE (2.5 MG/3ML) 0.083% IN NEBU: 3.0000 mL | INHALATION_SOLUTION | Freq: Four times a day (QID) | RESPIRATORY_TRACT | Status: AC | PRN

## 2020-12-07 NOTE — Discharge Summary (Addendum)
Physician Discharge Summary   ENRIGUE HASHIMI  male DOB: 1955/04/23  ZOX:[id number]  PCP: [facility]  Admit date: 11/14/2020 Discharge date: 12/07/2020  Admitted From: camper Disposition:  SNF CODE STATUS: DNR   Hospital Course:  For full details, please see H&P, progress notes, consult notes and ancillary notes.  Briefly,  RODOLPH HAGEMANN is a 65 year old male is brought to the hospital after he was found on the side of the road with altered mental status.  Noted to be hypotensive, hypothermic on arrival with elevated creatinine, liver enzymes and lactic acid.  Noted to have urinary tract infection and was admitted to the hospital for septic shock requiring vasopressors.   He was admitted to the ICU.  Overall hemodynamics improved with IV fluids. Transferred to [facility] on 9/14. Found to have 1/4 blood cultures positive for pseudomonas. He has had prolonged hospital course and remains encephalopathic.   Sepsis with septic shock secondary to urinary tract infection Pseudomonas bacteremia -He has been weaned off of vasopressors -Initially on Unasyn, subsequently transition to cefepime for Pseudomonas coverage -Based on culture sensitivities, cefepime changed to ceftazidime -Urine culture positive for staph epidermidis -1 out of 4 blood cultures positive for Pseudomonas --ID was consulted and pt finished recommended course of abx with [place].   New onset atrial fibrillation --likely triggered by infection --started on amio gtt, then converted and rate controlled, amio gtt d/c'ed on 9/28.  Pt was not started on oral rate control agents since this appeared to be an isolated event triggered by infection.  Pt has been rate controlled since 9/28.   Acute metabolic encephalopathy -Etiology is not entirely clear -May have a component related to underlying infection, although continues to be confused even after completing abx course. -He does have a history of alcoholism and  may have a component of Wernicke's, he has been receiving high-dose thiamine -Ammonia level normal, TSH normal  -CT head negative x2 -MRI brain unremarkable -EEG without evidence of epileptiform discharges -Overall electrolyte abnormalities have corrected -No further recommendations from neurology standpoint  History of alcohol abuse --cont thiamine and folic acid.   Alcoholic cirrhosis --US abdomen found mod ascites, s/p paracentesis on 9/29 with 1.8L removed, neg SBP   Thrombocytopenia, resolved -Likely multifactorial, related to acute infectious process/recent alcohol use/cirrhosis.  Plt count normalized prior to discharge.   Acute kidney injury, resolved -Cr 2.07 on presentation.  Secondary to hypovolemia --AKI resolved after IVF, back to baseline ~0.5   Urinary retention -Noted to have significant hematuria -Urology consulted and coud catheter was placed --Coude cath exchanged on 9/28 --follow up appointment scheduled with urology Dr. Lonna Cobb.   COPD --sating well on room air.  Not on daily inhaler at home.   Goals of care -Patient was living independently in a camper PTA. -Discussed CODE STATUS with patient's son who confirmed DNR -He is overall malnourished and may have difficulty making a meaningful recovery -Palliative care consulted to further address goals of care   Protein-calorie malnutrition, severe --Nepro TID and supplements  Hypomag --needed IV mag repletion.  Discharged on oral mag supplements.   Discharge Diagnoses:  Active Problems:   Severe sepsis (HCC)   Severe sepsis with septic shock (CODE) (HCC)   Protein-calorie malnutrition, severe   Bacteremia due to Pseudomonas   Alcoholic cirrhosis of liver without ascites (HCC)   Acute metabolic encephalopathy   Thrombocytopenia (HCC)   Acute urinary retention   Hematuria   Acute lower UTI   30 Day Unplanned  Readmission Risk Score    Flowsheet Row ED to Hosp-Admission (Current) from 11/14/2020  in [facility] (1C)  30 Day Unplanned Readmission Risk Score (%) 15.2 Filed at 12/07/2020 0801       This score is the patient's risk of an unplanned readmission within 30 days of being discharged (0 -100%). The score is based on dignosis, age, lab data, medications, orders, and past utilization.   Low:  0-14.9   Medium: 15-21.9   High: 22-29.9   Extreme: 30 and above         Discharge Instructions:  Allergies as of 12/07/2020       Reactions   Trazodone Anxiety        Medication List     TAKE these medications    albuterol (2.5 MG/3ML) 0.083% nebulizer solution Commonly known as: PROVENTIL Inhale 3 mLs into the lungs every 6 (six) hours as needed for wheezing or shortness of breath.   feeding supplement (NEPRO CARB STEADY) Liqd Take 237 mLs by mouth 3 (three) times daily between meals.   folic acid 1 MG tablet Commonly known as: FOLVITE Take 1 tablet (1 mg total) by mouth daily. Start taking on: December 08, 2020   Magnesium Oxide 200 MG Tabs Commonly known as: Mag-Oxide Take 2 tablets (400 mg total) by mouth 2 (two) times daily. Can give any magnesium supplement.   multivitamin with minerals Tabs tablet Take 1 tablet by mouth daily. Start taking on: December 08, 2020   pantoprazole 40 MG tablet Commonly known as: PROTONIX Take 1 tablet (40 mg total) by mouth daily.   thiamine 100 MG tablet Take 1 tablet (100 mg total) by mouth daily. Start taking on: December 08, 2020         Contact information for follow-up providers     [facility].   Specialty: General Practice Contact information: [address] [place] [address] [phone]         Riki Altes, MD Follow up.   Specialty: Urology Contact information: [address] [place] [address] [phone]              Contact information for after-discharge care     Destination     [facility] .   Service: Skilled Nursing Contact information: [address] [place] [address] [phone]                     Allergies  Allergen Reactions   Trazodone Anxiety     The results of significant diagnostics from this hospitalization (including imaging, microbiology, ancillary and laboratory) are listed below for reference.   Consultations:   Procedures/Studies: DG Abd 1 View  Result Date: 11/24/2020 CLINICAL DATA:  Feeding tube placement. EXAM: ABDOMEN - 1 VIEW COMPARISON:  November 18, 2020. FINDINGS: The bowel gas pattern is normal. Distal tip of feeding tube is seen in proximal stomach. No radio-opaque calculi or other significant radiographic abnormality are seen. IMPRESSION: Distal tip of feeding tube seen in proximal stomach. Electronically Signed   By: Lupita Raider M.D.   On: 11/24/2020 16:35   CT HEAD WO CONTRAST ( )  Result Date: 11/17/2020 CLINICAL DATA:  Mental status changes, unknown cause EXAM: CT HEAD WITHOUT CONTRAST TECHNIQUE: Contiguous axial images were obtained from the base of the skull through the vertex without intravenous contrast. COMPARISON:  11/15/2020 FINDINGS: Brain: There is atrophy and chronic small vessel  disease changes. No acute intracranial abnormality. Specifically, no hemorrhage, hydrocephalus, mass lesion, acute infarction, or significant intracranial injury. Vascular: No hyperdense vessel or unexpected calcification. Skull: No acute calvarial abnormality. Sinuses/Orbits: No acute findings Other: None IMPRESSION: Atrophy, chronic microvascular disease. No acute intracranial abnormality. Electronically Signed   By: Charlett Nose M.D.   On: 11/17/2020 09:52   CT HEAD WO CONTRAST ( )  Result Date: 11/15/2020 CLINICAL DATA:  Altered mental status. EXAM: CT HEAD WITHOUT CONTRAST TECHNIQUE: Contiguous axial images were obtained from the base of the skull through the vertex without intravenous contrast.  COMPARISON:  None. FINDINGS: Evaluation of this exam is limited due to motion artifact. Brain: Mild age-related atrophy and chronic microvascular ischemic changes. There is no acute intracranial hemorrhage. No mass effect or midline shift no extra-axial fluid collection. Vascular: No hyperdense vessel or unexpected calcification. Skull: Normal. Negative for fracture or focal lesion. Sinuses/Orbits: No acute finding. Other: None IMPRESSION: 1. No acute intracranial pathology. 2. Mild age-related atrophy and chronic microvascular ischemic changes. Electronically Signed   By: Elgie Collard M.D.   On: 11/15/2020 01:14   MR BRAIN WO CONTRAST  Result Date: 11/19/2020 CLINICAL DATA:  Mental status change, unknown cause EXAM: MRI HEAD WITHOUT CONTRAST TECHNIQUE: Multiplanar, multiecho pulse sequences of the brain and surrounding structures were obtained without intravenous contrast. COMPARISON:  CT head 11/17/2020. FINDINGS: Brain: No acute infarction, hemorrhage, hydrocephalus, extra-axial collection or mass lesion. Age advanced atrophy with ex vacuo ventricular dilation. Mild scattered T2 hyperintensities in the white matter, nonspecific but consistent with chronic microvascular ischemic disease. Vascular: Visualized major arterial flow voids are maintained. Skull and upper cervical spine: Normal marrow signal. Sinuses/Orbits: Clear sinuses.  Unremarkable orbits. Other: Distended right C1-C2 joint capsule without obvious edema in this region. Trace left greater than right mastoid effusions. IMPRESSION: 1. No evidence of acute intracranial abnormality. 2. Age advanced generalized atrophy. Electronically Signed   By: Feliberto Harts M.D.   On: 11/19/2020 06:22   US RENAL  Result Date: 11/15/2020 CLINICAL DATA:  Acute kidney injury. EXAM: RENAL / URINARY TRACT ULTRASOUND COMPLETE COMPARISON:  None FINDINGS: Right Kidney: Renal measurements: 9.7 x 5.5 x 5.0 cm = volume: 140 mL. 5 mm nonobstructing stone  identified lower pole region. No hydronephrosis. Cortical echogenicity within normal limits. Left Kidney: Renal measurements: 8.4 x 4.3 x 3.9 cm = volume: 74 mL. Echogenicity within normal limits. No mass or hydronephrosis visualized. Bladder: Appears normal for degree of bladder distention. Other: Trace free fluid noted around the spleen. IMPRESSION: 1. No hydronephrosis. 2. 5 mm nonobstructing stone lower pole right kidney. 3. Trace free fluid around the spleen. Electronically Signed   By: Kennith Center M.D.   On: 11/15/2020 05:16   DG Pelvis Portable  Result Date: 11/18/2020 CLINICAL DATA:  MRI screening EXAM: PORTABLE PELVIS 1-2 VIEWS COMPARISON:  None. FINDINGS: Catheter over the bladder. 14 mm oval calcification in the right pelvis, possible bladder stone. Amorphous calcifications over pubic symphysis consistent with prostate calcification. No metallic density foreign body over the pelvis IMPRESSION: 1. Negative for metallic density foreign body over the pelvis 2. Possible 14 mm bladder stone Electronically Signed   By: Jasmine Pang M.D.   On: 11/18/2020 21:26   US Paracentesis  Result Date: 12/02/2020 INDICATION: Ascites secondary to alcoholic cirrhosis. Request for diagnostic and therapeutic paracentesis. EXAM: ULTRASOUND GUIDED PARACENTESIS MEDICATIONS: 1% lidocaine 10 mL COMPLICATIONS: None immediate. PROCEDURE: Informed written consent was obtained from the patient after a discussion of the risks, benefits and  alternatives to treatment. A timeout was performed prior to the initiation of the procedure. Initial ultrasound scanning demonstrates a large amount of ascites within the left lateral abdomen. The left lateral abdomen was prepped and draped in the usual sterile fashion. 1% lidocaine was used for local anesthesia. Following this, a 6 Fr Safe-T-Centesis catheter was introduced. An ultrasound image was saved for documentation purposes. The paracentesis was performed. The catheter was removed  and a dressing was applied. The patient tolerated the procedure well without immediate post procedural complication. FINDINGS: A total of approximately 1.8 L of clear yellow fluid was removed. Samples were sent to the laboratory as requested by the clinical team. IMPRESSION: Successful ultrasound-guided paracentesis yielding 1.8 liters of peritoneal fluid. Read by: Corrin Parker, PA-C Electronically Signed   By: Irish Lack M.D.   On: 12/02/2020 14:20   DG Chest Port 1 View  Result Date: 11/29/2020 CLINICAL DATA:  New onset fever. EXAM: PORTABLE CHEST 1 VIEW COMPARISON:  11/19/2020 and CT chest 12/04/2016. FINDINGS: Trachea is midline. Heart size stable. Thoracic aorta is calcified. Streaky densities in the right upper lobe and left perihilar region are again seen. Postoperative changes in the left perihilar region. No airspace consolidation or pleural fluid. Slightly elevated left hemidiaphragm. IMPRESSION: No acute findings. Electronically Signed   By: Leanna Battles M.D.   On: 11/29/2020 15:10   DG Chest Port 1 View  Result Date: 11/19/2020 CLINICAL DATA:  Shortness of EXAM: PORTABLE CHEST 1 VIEW COMPARISON:  11/17/2020 FINDINGS: The heart size and mediastinal contours are within normal limits. There is new, diffuse bilateral interstitial pulmonary opacity and pulmonary vascular prominence. Surgical suture projects in the left midlung. The visualized skeletal structures are unremarkable. IMPRESSION: New, diffuse bilateral interstitial pulmonary opacity and pulmonary vascular prominence, most consistent with edema. No focal airspace opacity. Electronically Signed   By: Lauralyn Primes M.D.   On: 11/19/2020 16:51   DG Chest Port 1 View  Result Date: 11/17/2020 CLINICAL DATA:  Cough EXAM: PORTABLE CHEST 1 VIEW COMPARISON:  11/14/2020 FINDINGS: Scarring in the right lung apex. Postsurgical changes in the left upper lobe with volume loss in left hemithorax. No focal consolidation. No pleural effusion or  pneumothorax. The heart is normal in size.  Thoracic aortic atherosclerosis. IMPRESSION: No evidence of acute cardiopulmonary disease. Electronically Signed   By: Charline Bills M.D.   On: 11/17/2020 21:17   DG Chest Port 1 View  Result Date: 11/14/2020 CLINICAL DATA:  Questionable sepsis. EXAM: PORTABLE CHEST 1 VIEW COMPARISON:  Chest radiograph dated 04/16/2017 FINDINGS: Background of emphysema. There are biapical subpleural scarring. Apparent 18 mm subpleural nodule at the right apex, likely scarring. Similar findings were seen on the CT of 12/04/2016. Attention on follow-up imaging or further evaluation with CT on a nonemergent/outpatient basis recommended. No consolidative changes. There is no pleural effusion pneumothorax. The cardiac silhouette is within normal limits. Atherosclerotic calcification of the aorta. No acute osseous pathology. IMPRESSION: 1. No acute cardiopulmonary process. 2. Emphysema and biapical scarring. Electronically Signed   By: Elgie Collard M.D.   On: 11/14/2020 20:23   DG Abd Portable 1V  Result Date: 11/18/2020 CLINICAL DATA:  MRI screening EXAM: PORTABLE ABDOMEN - 1 VIEW COMPARISON:  None. FINDINGS: Lung bases are clear. Nonobstructed gas pattern. No metallic density foreign bodies over the abdomen. IMPRESSION: Negative.  No metallic density foreign bodies over the abdomen Electronically Signed   By: Jasmine Pang M.D.   On: 11/18/2020 21:25   EEG adult  Result  Date: 11/26/2020 Jefferson Fuel, MD     11/26/2020  6:07 PM Routine EEG Report MERRELL BORSUK is a 65 y.o. male with a history of encephalopathy who is undergoing an EEG to evaluate for seizures. Report: This EEG was acquired with electrodes placed according to the International 10-20 electrode system (including Fp1, Fp2, F3, F4, C3, C4, P3, P4, O1, O2, T3, T4, T5, T6, A1, A2, Fz, Cz, Pz). The following electrodes were missing or displaced: none. The occipital dominant rhythm was 3-5 Hz. This activity is  reactive to stimulation. Drowsiness was manifested by background fragmentation; deeper stages of sleep were not identified. There was no focal slowing. There were no interictal epileptiform discharges. There were no electrographic seizures identified. There was no abnormal response to photic stimulation. Hyperventilation was not performed. Impression and clinical correlation: This EEG was obtained while awake and drowsy and is abnormal due to severe diffuse slowing indicative of global cerebral dysfunction. Bing Neighbors, MD [facility] [phone] If 7pm- 7am, please page neurology on call as listed in AMION.   EEG adult  Result Date: 11/19/2020 Jefferson Fuel, MD     11/19/2020  3:03 PM Routine EEG Report PHONG ISENBERG is a 65 y.o. male with a history of sepsis who is undergoing an EEG to evaluate for seizures. Report: This EEG was acquired with electrodes placed according to the International 10-20 electrode system (including Fp1, Fp2, F3, F4, C3, C4, P3, P4, O1, O2, T3, T4, T5, T6, A1, A2, Fz, Cz, Pz). The following electrodes were missing or displaced: none. The best background was 4-6 Hz. This activity is reactive to stimulation. There was rare sleep architecture noted which manifested as sleep spindles. There was no focal slowing. There were no interictal epileptiform discharges. There were no electrographic seizures identified. Photic stimulation and hyperventilation were not performed. Impression and clinical correlation: This EEG was obtained while sedated (post ativan administration) with rare sleep architecture and was abnormal due to moderate diffuse slowing indicative of global cerebral dysfunction. Bing Neighbors, MD [facility] [phone] If 7pm- 7am, please page neurology on call as listed in AMION.   ECHOCARDIOGRAM COMPLETE  Result Date: 11/22/2020    ECHOCARDIOGRAM REPORT   Patient Name:   MAICOL BOWLAND Date of Exam: 11/22/2020 Medical Rec #:  [id number]          Height:       66.0 in Accession #:    [id number]        Weight:       140.0 lb Date of Birth:  08/31/1955         BSA:          1.719 m Patient Age:    65 years          BP:           101/68 mmHg Patient Gender: M                 HR:           94 bpm. Exam Location:  [facility] Procedure: 2D Echo, Color Doppler and Cardiac Doppler Indications:     I50.9 Congestive Heart Failure  History:         Patient has no prior history of Echocardiogram examinations.                  COPD; Signs/Symptoms:Altered mental status.  Sonographer:     Humphrey Rolls Referring Phys:  [id number] Diagnosing Phys: Lorine Bears MD  Sonographer Comments: Suboptimal parasternal window. Image acquisition challenging due to uncooperative patient and Image acquisition challenging due to COPD. IMPRESSIONS  1. Left ventricular ejection fraction, by estimation, is 55 to 60%. The left ventricle has normal function. The left ventricle has no regional wall motion abnormalities. Left ventricular diastolic parameters were normal.  2. Right ventricular systolic function is normal. The right ventricular size is normal. Tricuspid regurgitation signal is inadequate for assessing PA pressure.  3. The mitral valve is normal in structure. No evidence of mitral valve regurgitation. No evidence of mitral stenosis.  4. The aortic valve was not well visualized. Aortic valve regurgitation is not visualized. Mild aortic valve stenosis. Aortic valve area, by VTI measures 1.71 cm. Aortic valve mean gradient measures 9.0 mmHg.  5. The inferior vena cava is normal in size with greater than 50% respiratory variability, suggesting right atrial pressure of 3 mmHg. FINDINGS  Left Ventricle: Left ventricular ejection fraction, by estimation, is 55 to 60%. The left ventricle has normal function. The left ventricle has no regional wall motion abnormalities. The left ventricular internal cavity size was normal in size. There is  no left ventricular hypertrophy. Left  ventricular diastolic parameters were normal. Right Ventricle: The right ventricular size is normal. No increase in right ventricular wall thickness. Right ventricular systolic function is normal. Tricuspid regurgitation signal is inadequate for assessing PA pressure. Left Atrium: Left atrial size was normal in size. Right Atrium: Right atrial size was normal in size. Pericardium: There is no evidence of pericardial effusion. Mitral Valve: The mitral valve is normal in structure. No evidence of mitral valve regurgitation. No evidence of mitral valve stenosis. MV peak gradient, 3.0 mmHg. The mean mitral valve gradient is 2.0 mmHg. Tricuspid Valve: The tricuspid valve is normal in structure. Tricuspid valve regurgitation is not demonstrated. No evidence of tricuspid stenosis. Aortic Valve: The aortic valve was not well visualized. Aortic valve regurgitation is not visualized. Mild aortic stenosis is present. Aortic valve mean gradient measures 9.0 mmHg. Aortic valve peak gradient measures 16.6 mmHg. Aortic valve area, by VTI measures 1.71 cm. Pulmonic Valve: The pulmonic valve was normal in structure. Pulmonic valve regurgitation is not visualized. No evidence of pulmonic stenosis. Aorta: The aortic root is normal in size and structure. Venous: The inferior vena cava is normal in size with greater than 50% respiratory variability, suggesting right atrial pressure of 3 mmHg. IAS/Shunts: No atrial level shunt detected by color flow Doppler.  LEFT VENTRICLE PLAX 2D LVIDd:         3.70 cm  Diastology LVIDs:         2.40 cm  LV e' medial:    7.62 cm/s LV PW:         0.90 cm  LV E/e' medial:  12.3 LV IVS:        0.70 cm  LV e' lateral:   8.38 cm/s LVOT diam:     2.00 cm  LV E/e' lateral: 11.2 LV SV:         53 LV SV Index:   31 LVOT Area:     3.14 cm  RIGHT VENTRICLE RV Basal diam:  2.30 cm LEFT ATRIUM             Index       RIGHT ATRIUM          Index LA diam:        3.80 cm 2.21 cm/m  RA Area:     5.89 cm LA Vol  (  A2C):   17.9 ml 10.42 ml/m RA Volume:   8.62 ml  5.02 ml/m LA Vol (A4C):   30.7 ml 17.86 ml/m LA Biplane Vol: 24.2 ml 14.08 ml/m  AORTIC VALVE                    PULMONIC VALVE AV Area (Vmax):    1.43 cm     PV Vmax:       0.69 m/s AV Area (Vmean):   1.53 cm     PV Vmean:      37.800 cm/s AV Area (VTI):     1.71 cm     PV VTI:        0.078 m AV Vmax:           204.00 cm/s  PV Peak grad:  1.9 mmHg AV Vmean:          139.000 cm/s PV Mean grad:  1.0 mmHg AV VTI:            0.308 m AV Peak Grad:      16.6 mmHg AV Mean Grad:      9.0 mmHg LVOT Vmax:         92.70 cm/s LVOT Vmean:        67.600 cm/s LVOT VTI:          0.168 m LVOT/AV VTI ratio: 0.55  AORTA Ao Root diam: 3.00 cm MITRAL VALVE MV Area (PHT): 4.29 cm    SHUNTS MV Area VTI:   3.00 cm    Systemic VTI:  0.17 m MV Peak grad:  3.0 mmHg    Systemic Diam: 2.00 cm MV Mean grad:  2.0 mmHg MV Vmax:       0.86 m/s MV Vmean:      65.7 cm/s MV Decel Time: 177 msec MV E velocity: 93.80 cm/s MV A velocity: 79.70 cm/s MV E/A ratio:  1.18 Lorine Bears MD Electronically signed by Lorine Bears MD Signature Date/Time: 11/22/2020/1:53:54 PM    Final    [place] ASCITES (ABDOMEN LIMITED)  Result Date: 12/01/2020 CLINICAL DATA:  Abdominal distension EXAM: LIMITED ABDOMEN ULTRASOUND FOR ASCITES TECHNIQUE: Limited ultrasound survey for ascites was performed in all four abdominal quadrants. COMPARISON:  None. FINDINGS: Moderate amount ascites. Largest fluid pocket is seen in the right lower quadrant. Cirrhotic morphology of the liver IMPRESSION: Moderate ascites Electronically Signed   By: Jasmine Pang M.D.   On: 12/01/2020 16:45   US Abdomen Limited RUQ (LIVER/GB)  Result Date: 11/15/2020 CLINICAL DATA:  Abnormal liver function tests.  Acute pancreatitis. EXAM: ULTRASOUND ABDOMEN LIMITED RIGHT UPPER QUADRANT COMPARISON:  None. FINDINGS: Gallbladder: Sludge noted in the lumen of the gallbladder without gallbladder wall thickening. No definite gallstones. Common bile  duct: Diameter: 3-4 mm Liver: Liver is markedly echogenic with nodular contour. No intrahepatic biliary duct dilatation. Portal vein is patent on color Doppler imaging with normal direction of blood flow towards the liver. Other: Small volume free fluid noted adjacent to the liver and spleen. IMPRESSION: 1. Gallbladder sludge without gallbladder wall thickening or gallstones. No biliary dilatation. 2. Nodular, echogenic liver compatible with cirrhosis. 3. Small volume ascites. Electronically Signed   By: Kennith Center M.D.   On: 11/15/2020 05:26      Labs: BNP (last 3 results) Recent Labs    11/20/20 0434  BNP 539.2*   Basic Metabolic Panel: Recent Labs  Lab 12/02/20 0448 12/03/20 0629 12/07/20 0612  NA 136  --  137  K 3.9  --  4.0  CL 105  --  105  CO2 22  --  26  GLUCOSE 100*  --  100*  BUN 8  --  8  CREATININE 0.55*  --  0.59*  CALCIUM 8.4*  --  8.2*  MG 1.4* 1.8 1.4*   Liver Function Tests: No results for input(s): AST, ALT, ALKPHOS, BILITOT, PROT, ALBUMIN in the last 168 hours. No results for input(s): LIPASE, AMYLASE in the last 168 hours. No results for input(s): AMMONIA in the last 168 hours. CBC: Recent Labs  Lab 12/02/20 0448 12/07/20 0612  WBC 5.0 5.8  HGB 10.2* 9.7*  HCT 31.1* 28.5*  MCV 98.1 96.0  PLT 124* 219   Cardiac Enzymes: No results for input(s): CKTOTAL, CKMB, CKMBINDEX, TROPONINI in the last 168 hours. BNP: Invalid input(s): POCBNP CBG: Recent Labs  Lab 12/03/20 1144 12/03/20 1647 12/03/20 1919 12/04/20 0759 12/04/20 1237  GLUCAP 110* 169* 101* 80 106*   D-Dimer No results for input(s): DDIMER in the last 72 hours. Hgb A1c No results for input(s): HGBA1C in the last 72 hours. Lipid Profile No results for input(s): CHOL, HDL, LDLCALC, TRIG, CHOLHDL, LDLDIRECT in the last 72 hours. Thyroid function studies No results for input(s): TSH, T4TOTAL, T3FREE, THYROIDAB in the last 72 hours.  Invalid input(s): FREET3 Anemia work up No  results for input(s): VITAMINB12, FOLATE, FERRITIN, TIBC, IRON, RETICCTPCT in the last 72 hours. Urinalysis    Component Value Date/Time   COLORURINE AMBER (A) 11/14/2020 2008   APPEARANCEUR CLOUDY (A) 11/14/2020 2008   LABSPEC 1.025 11/14/2020 2008   PHURINE 5.5 11/14/2020 2008   GLUCOSEU 100 (A) 11/14/2020 2008   HGBUR NEGATIVE 11/14/2020 2008   BILIRUBINUR LARGE (A) 11/14/2020 2008   KETONESUR TRACE (A) 11/14/2020 2008   PROTEINUR 30 (A) 11/14/2020 2008   NITRITE POSITIVE (A) 11/14/2020 2008   LEUKOCYTESUR TRACE (A) 11/14/2020 2008   Sepsis Labs Invalid input(s): PROCALCITONIN,  WBC,  LACTICIDVEN Microbiology Recent Results (from the past 240 hour(s))  Resp Panel by RT-PCR (Flu A&B, Covid) Nasopharyngeal Swab     Status: None   Collection Time: 12/06/20  2:27 PM   Specimen: Nasopharyngeal Swab; Nasopharyngeal(NP) swabs in vial transport medium  Result Value Ref Range Status   SARS Coronavirus 2 by RT PCR NEGATIVE NEGATIVE Final    Comment: (NOTE) SARS-CoV-2 target nucleic acids are NOT DETECTED.  The SARS-CoV-2 RNA is generally detectable in upper respiratory specimens during the acute phase of infection. The lowest concentration of SARS-CoV-2 viral copies this assay can detect is 138 copies/mL. A negative result does not preclude SARS-Cov-2 infection and should not be used as the sole basis for treatment or other patient management decisions. A negative result may occur with  improper specimen collection/handling, submission of specimen other than nasopharyngeal swab, presence of viral mutation(s) within the areas targeted by this assay, and inadequate number of viral copies(<138 copies/mL). A negative result must be combined with clinical observations, patient history, and epidemiological information. The expected result is Negative.  Fact Sheet for Patients:  BloggerCourse.com  Fact Sheet for Healthcare Providers:   SeriousBroker.it  This test is no t yet approved or cleared by the [place] FDA and  has been authorized for detection and/or diagnosis of SARS-CoV-2 by FDA under an Emergency Use Authorization (EUA). This EUA will remain  in effect (meaning this test can be used) for the duration of the COVID-19 declaration under Section 564(b)(1) of the Act, 21 U.S.C.section 360bbb-3(b)(1), unless the authorization is terminated  or revoked sooner.       Influenza A by PCR NEGATIVE NEGATIVE Final   Influenza B by PCR NEGATIVE NEGATIVE Final    Comment: (NOTE) The Xpert Xpress SARS-CoV-2/FLU/RSV plus assay is intended as an aid in the diagnosis of influenza from Nasopharyngeal swab specimens and should not be used as a sole basis for treatment. Nasal washings and aspirates are unacceptable for Xpert Xpress SARS-CoV-2/FLU/RSV testing.  Fact Sheet for Patients: BloggerCourse.com  Fact Sheet for Healthcare Providers: SeriousBroker.it  This test is not yet approved or cleared by the [place] FDA and has been authorized for detection and/or diagnosis of SARS-CoV-2 by FDA under an Emergency Use Authorization (EUA). This EUA will remain in effect (meaning this test can be used) for the duration of the COVID-19 declaration under Section 564(b)(1) of the Act, 21 U.S.C. section 360bbb-3(b)(1), unless the authorization is terminated or revoked.  Performed at [facility], [address] [place], [place] [address]      Total time spend on discharging this patient, including the last patient exam, discussing the hospital stay, instructions for ongoing care as it relates to all pertinent caregivers, as well as preparing the medical discharge records, prescriptions, and/or referrals as applicable, is 30 minutes.    Darlin Priestly, MD  [facility] 12/07/2020, 10:19 AM

## 2020-12-07 NOTE — Plan of Care (Signed)
  Problem: Clinical Measurements: Goal: Respiratory complications will improve Outcome: Progressing   Problem: Coping: Goal: Level of anxiety will decrease Outcome: Progressing   Problem: Skin Integrity: Goal: Risk for impaired skin integrity will decrease Outcome: Progressing   Problem: Education: Goal: Knowledge of General Education information will improve Description: Including pain rating scale, medication(s)/side effects and non-pharmacologic comfort measures Outcome: Not Progressing

## 2020-12-07 NOTE — Care Management Important Message (Signed)
Important Message  Patient Details  Name: Dustin Vazquez MRN: [id number] Date of Birth: [birth year]   Medicare Important Message Given:  No (unable to speak with patient due to current medical status, left voicemail for son Mats Jeanlouis to return my call.)     Corey Harold 12/07/2020, 1:00 PM

## 2020-12-07 NOTE — TOC Transition Note (Signed)
Transition of Care [facility]) - CM/SW Discharge Note   Patient Details  Name: Dustin Vazquez MRN: [id number] Date of Birth: 07/26/1955  Transition of Care [facility]) CM/SW Contact:  Allayne Butcher, RN Phone Number: 12/07/2020, 1:34 PM   Clinical Narrative:    Patient medically cleared for discharge to [facility] and [facility] in [place].  RNCM was able to speak with [name] yesterday afternoon about discharge today.  [name] in agreement with discharge to [facility] today.  Patient will be ready once his Magnesium infusion has completed.  [facility] may be able to provide transport but if their [employer] truck is unavailable then they will not be able to transport today.  [facility] can take patient at 10 pm tonight if [facility] cannot provide transport.  Bedside RN to call report - patient going to room 413-1.   Final next level of care: Skilled Nursing Facility Barriers to Discharge: Barriers Resolved   Patient Goals and CMS Choice Patient states their goals for this hospitalization and ongoing recovery are:: Patient's son in agreement with SNF at [facility].gov Compare Post Acute Care list provided to:: Patient Represenative (must comment) Choice offered to / list presented to : Adult Children  Discharge Placement              Patient chooses bed at: [facility] Patient to be transferred to facility by: [facility] or [facility] Name of family member notified: [name] Patient and family notified of of transfer: 12/06/20  Discharge Plan and Services In-house Referral: Clinical Social Work              DME Arranged: N/A DME Agency: NA       HH Arranged: NA          Social Determinants of Health (SDOH) Interventions     Readmission Risk Interventions No flowsheet data found.

## 2020-12-14 ENCOUNTER — Encounter: Payer: Self-pay | Admitting: Gastroenterology

## 2020-12-16 ENCOUNTER — Ambulatory Visit: Payer: Medicare Other | Admitting: Urology

## 2020-12-16 NOTE — Progress Notes (Deleted)
Assessment: No diagnosis found.   Plan: ***  Chief Complaint: No chief complaint on file.   History of Present Illness:  Dustin Vazquez is a 65 y.o. year old male who is seen for evaluation of urinary retention.  The patient was admitted to [facility] in September 2022 due to altered mental status.  He was noted to be hypotensive and hypothermic and felt to be in septic shock secondary to UTI.  A Foley catheter was placed.  Urine culture grew >100 K Staphylococcus.  During his admission he pulled out his Foley catheter.  This was replaced by Dr.Stoioff.  He had to have his catheter replaced 2 more times during his admission due to obstruction from hematuria.  His catheter was last replaced on 12/01/2020.  He was discharged from the hospital on 12/07/2020.  He is not currently on an alpha-blocker.   Past Medical History:  Past Medical History:  Diagnosis Date   COPD (chronic obstructive pulmonary disease) (HCC)     Past Surgical History:  No past surgical history on file.  Allergies:  Allergies  Allergen Reactions   Trazodone Anxiety    Family History:  No family history on file.  Social History:  Social History   Tobacco Use   Smoking status: Never   Smokeless tobacco: Never  Substance Use Topics   Alcohol use: Yes    Comment: ocasionally    Review of symptoms:  Constitutional:  Negative for unexplained weight loss, night sweats, fever, chills ENT:  Negative for nose bleeds, sinus pain, painful swallowing CV:  Negative for chest pain, shortness of breath, exercise intolerance, palpitations, loss of consciousness Resp:  Negative for cough, wheezing, shortness of breath GI:  Negative for nausea, vomiting, diarrhea, bloody stools GU:  Positives noted in HPI; otherwise negative for gross hematuria, dysuria, urinary incontinence Neuro:  Negative for seizures, poor balance, limb weakness, slurred speech Psych:  Negative for lack of energy,  depression, anxiety Endocrine:  Negative for polydipsia, polyuria, symptoms of hypoglycemia (dizziness, hunger, sweating) Hematologic:  Negative for anemia, purpura, petechia, prolonged or excessive bleeding, use of anticoagulants  Allergic:  Negative for difficulty breathing or choking as a result of exposure to anything; no shellfish allergy; no allergic response (rash/itch) to materials, foods  Physical exam: There were no vitals taken for this visit. GENERAL APPEARANCE:  Well appearing, well developed, well nourished, NAD HEENT: Atraumatic, Normocephalic, oropharynx clear. NECK: Supple without lymphadenopathy or thyromegaly. LUNGS: Clear to auscultation bilaterally. HEART: Regular Rate and Rhythm without murmurs, gallops, or rubs. ABDOMEN: Soft, non-tender, No Masses. EXTREMITIES: Moves all extremities well.  Without clubbing, cyanosis, or edema. NEUROLOGIC:  Alert and oriented x 3, normal gait, CN II-XII grossly intact.  MENTAL STATUS:  Appropriate. BACK:  Non-tender to palpation.  No CVAT SKIN:  Warm, dry and intact.    Results: No results found for this or any previous visit (from the past 24 hour(s)).

## 2020-12-29 NOTE — Progress Notes (Signed)
Line   Assessment: 1. Urinary retention   2. Urinary tract infection associated with indwelling urethral catheter, initial encounter (HCC)     Plan: Continue Augmentin and fosfomycin as ordered for UTI Tamsulosin 0.4 mg daily Foley removed today after successful voiding trial. Return to office in 10-14 days May replace foley catheter if unable to void  Chief Complaint:  Chief Complaint  Patient presents with   Urinary Retention     History of Present Illness:  Dustin Vazquez is a 65 y.o. year old male who is seen for evaluation of urinary retention.  He was admitted to [facility] after being found on the side of the road with altered mental status in September 2022.  He was noted to be hypotensive and hypothermic and felt to be in septic shock secondary to UTI.  A Foley catheter was placed.  Urine culture grew >100 K Staph epidermidis and blood cultures were positive for Pseudomonas.Marland Kitchen  He was seen in consultation by urology at [facility] on 11/17/2020 after the Foley catheter was traumatically removed.  A coud catheter was replaced without difficulty.  He was discharged to a rehab facility with a Foley in place.  His Foley has been draining well.  The Foley was traumatically removed on 12/25/2020.  He was seen in the emergency room in [place].  A Foley catheter was replaced.  Urine culture at that time grew >100 K E. coli.  He is currently on Augmentin and fosfomycin.  His Foley is draining clear urine.  The patient denies any prior history of voiding difficulty.  He does have a history of alcohol abuse and dementia.  He is currently a resident at [facility].  Past Medical History:  Past Medical History:  Diagnosis Date   COPD (chronic obstructive pulmonary disease) (HCC)     Past Surgical History:  History reviewed. No pertinent surgical history.  Allergies:  Allergies  Allergen Reactions   Trazodone Anxiety    Family History:  History reviewed. No pertinent family  history.  Social History:  Social History   Tobacco Use   Smoking status: Former    Types: Cigarettes  Substance Use Topics   Alcohol use: Yes    Comment: ocasionally    Review of symptoms:  Constitutional:  Negative for unexplained weight loss, night sweats, fever, chills ENT:  Negative for nose bleeds, sinus pain, painful swallowing CV:  Negative for chest pain, shortness of breath, exercise intolerance, palpitations, loss of consciousness Resp:  Negative for cough, wheezing, shortness of breath GI:  Negative for nausea, vomiting, diarrhea, bloody stools GU:  Positives noted in HPI; otherwise negative for gross hematuria, dysuria, urinary incontinence Neuro:  Negative for seizures, poor balance, limb weakness, slurred speech Psych:  Negative for lack of energy, depression, anxiety Endocrine:  Negative for polydipsia, polyuria, symptoms of hypoglycemia (dizziness, hunger, sweating) Hematologic:  Negative for anemia, purpura, petechia, prolonged or excessive bleeding, use of anticoagulants  Allergic:  Negative for difficulty breathing or choking as a result of exposure to anything; no shellfish allergy; no allergic response (rash/itch) to materials, foods  Physical exam: BP 108/72   Pulse 98   Ht 5\' 6"  (1.676 m)   Wt 121 lb (54.9 kg)   BMI 19.53 kg/m  GENERAL APPEARANCE:  Well appearing, well developed, well nourished, NAD HEENT: Atraumatic, Normocephalic, oropharynx clear. NECK: Supple without lymphadenopathy or thyromegaly. LUNGS: Clear to auscultation bilaterally. HEART: Regular Rate and Rhythm without murmurs, gallops, or rubs. ABDOMEN: Soft, non-tender, No Masses.  EXTREMITIES: Moves all extremities well.  Without clubbing, cyanosis, or edema. NEUROLOGIC:  Alert and oriented x 3, in wheelchair, CN II-XII grossly intact.  MENTAL STATUS:  Appropriate BACK:  Non-tender to palpation.  No CVAT SKIN:  Warm, dry and intact.   GU:  foley draining clear  urine  Results: None  Voiding trial: 100 ml instilled, patient voided 100 ml

## 2020-12-30 ENCOUNTER — Other Ambulatory Visit: Payer: Self-pay

## 2020-12-30 ENCOUNTER — Ambulatory Visit (INDEPENDENT_AMBULATORY_CARE_PROVIDER_SITE_OTHER): Payer: Medicare Other | Admitting: Urology

## 2020-12-30 ENCOUNTER — Encounter: Payer: Self-pay | Admitting: Urology

## 2020-12-30 VITALS — BP 108/72 | HR 98 | Ht 66.0 in | Wt 121.0 lb

## 2020-12-30 DIAGNOSIS — R339 Retention of urine, unspecified: Secondary | ICD-10-CM | POA: Diagnosis not present

## 2020-12-30 DIAGNOSIS — N390 Urinary tract infection, site not specified: Secondary | ICD-10-CM | POA: Diagnosis not present

## 2020-12-30 DIAGNOSIS — T83511A Infection and inflammatory reaction due to indwelling urethral catheter, initial encounter: Secondary | ICD-10-CM | POA: Diagnosis not present

## 2020-12-30 NOTE — Progress Notes (Signed)
Fill and Pull Catheter Removal  Patient is present today for a catheter removal.  Patient was cleaned and prepped in a sterile fashion of sterile water/ saline was instilled into the bladder when the patient felt the urge to urinate. of water was then drained from the balloon.  A 18FR coude foley cath was removed from the bladder no complications were noted .  Patient as then given some time to void on their own.  Patient can void  on their own after some time.  Patient tolerated well.  Performed by: Loraina Stauffer LPN  Follow up/ Additional notes: Per MD note

## 2020-12-30 NOTE — Progress Notes (Signed)
Urological Symptom Review  Patient is experiencing the following symptoms: Urinary tract infection   Review of Systems  Gastrointestinal (upper)  : Negative for upper GI symptoms  Gastrointestinal (lower) : Negative for lower GI symptoms  Constitutional : Weight loss Fatigue  Skin: Negative for skin symptoms  Eyes: Negative for eye symptoms  Ear/Nose/Throat : Negative for Ear/Nose/Throat symptoms  Hematologic/Lymphatic: Negative for Hematologic/Lymphatic symptoms  Cardiovascular : Chest pain  Respiratory : Shortness of breath  Endocrine: Negative for endocrine symptoms  Musculoskeletal: Back pain  Neurological: Negative for neurological symptoms  Psychologic: Negative for psychiatric symptoms

## 2021-01-06 ENCOUNTER — Ambulatory Visit: Payer: Medicare Other | Admitting: Physician Assistant

## 2021-01-10 ENCOUNTER — Ambulatory Visit: Payer: Medicare Other | Admitting: Urology

## 2021-01-11 ENCOUNTER — Ambulatory Visit (INDEPENDENT_AMBULATORY_CARE_PROVIDER_SITE_OTHER): Payer: Medicare Other | Admitting: Gastroenterology

## 2021-01-11 ENCOUNTER — Other Ambulatory Visit (INDEPENDENT_AMBULATORY_CARE_PROVIDER_SITE_OTHER): Payer: Medicare Other

## 2021-01-11 ENCOUNTER — Encounter: Payer: Self-pay | Admitting: Gastroenterology

## 2021-01-11 VITALS — BP 100/62 | HR 111 | Ht 66.0 in | Wt 121.0 lb

## 2021-01-11 DIAGNOSIS — K7031 Alcoholic cirrhosis of liver with ascites: Secondary | ICD-10-CM

## 2021-01-11 DIAGNOSIS — R932 Abnormal findings on diagnostic imaging of liver and biliary tract: Secondary | ICD-10-CM | POA: Diagnosis not present

## 2021-01-11 DIAGNOSIS — R748 Abnormal levels of other serum enzymes: Secondary | ICD-10-CM | POA: Diagnosis not present

## 2021-01-11 DIAGNOSIS — K7030 Alcoholic cirrhosis of liver without ascites: Secondary | ICD-10-CM | POA: Diagnosis not present

## 2021-01-11 LAB — CREATININE, SERUM: Creatinine, Ser: 0.53 mg/dL (ref 0.40–1.50)

## 2021-01-11 LAB — PROTIME-INR
INR: 1.4 ratio — ABNORMAL HIGH (ref 0.8–1.0)
Prothrombin Time: 15 s — ABNORMAL HIGH (ref 9.6–13.1)

## 2021-01-11 LAB — SODIUM: Sodium: 142 mEq/L (ref 135–145)

## 2021-01-11 LAB — FERRITIN: Ferritin: 91.5 ng/mL (ref 22.0–322.0)

## 2021-01-11 NOTE — Patient Instructions (Signed)
If you are age 66 or older, your body mass index should be between 23-30. Your Body mass index is 19.53 kg/m. If this is out of the aforementioned range listed, please consider follow up with your Primary Care Provider.  If you are age 39 or younger, your body mass index should be between 19-25. Your Body mass index is 19.53 kg/m. If this is out of the aformentioned range listed, please consider follow up with your Primary Care Provider.   Your provider has requested that you go to the basement level for lab work before leaving today. Press "B" on the elevator. The lab is located at the first door on the left as you exit the elevator.   We have given you an order to have an MRI done at [facility]. Please take this order and schedule it as soon as possible.  Stay on a 2000 mg a day sodium diet. Follow up in our office in 3-4 weeks after your MRI.

## 2021-01-11 NOTE — Progress Notes (Signed)
Referring Provider: [facility] [place] Physician:  Hal Morales, DO  Reason for Consultation: Alcoholic cirrhosis   IMPRESSION:  Suspected cirrhosis by ultrasound Ascites s/p paracentesis of 1.8 L 12/02/2020    - SAAG while hospital <0.5 which is not consistent with portal hypertension Normal platelets with recent thrombocytopenia while hospitalized Prolonged metabolic encephalopathy during recent hospitalization with normal ammonia level  Discordant information between ultrasound findings and SAAG and platelet counts. No stigmata of chronic liver disease on exam. No prior abdominal imaging for comparison.   PLAN: - 2000 mg sodium restricted diet given the history of ascites - Abstain from all alcohol including beer, wine, non-alcohol beer or liquor  - Labs today including: Hepatitis C antibody, hepatitis B surface antigen, hepatitis B core antibody, fasting ferritin, fasting insulin, ANA, AMA, anti-smooth muscle antibody, IgG, IgM  - Na, creatinine, hepatic function panel, AFP, PT/INR - MRI of the liver (will include elastography if that is available) - Consider ultrasound elastography if not available by MRI - Follow-up in this office in 3-4 weeks, earlier needed - Will need to consider screening colonoscopy +/- EGD for esophageal varices screening if appropriate on review of labs and imaging studies   HPI: Dustin Vazquez is a 65 y.o. 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  The history is obtained through the patient and review of his electronic health record. An aide from [facility] accompanies him today and contributes to his history.   Hospitalized at [facility] 11/15/2020 through 12/07/2020 for sepsis with septic shock due to Pseudomonas bacteremia urinary tract infection, new onset atrial fibrillation, and prolonged acute metabolic encephalopathy.  He has a long history of alcoholism  and was thought to have a component of Warnicke's.  He was treated with high-dose thiamine.  Ammonia level was normal.  TSH normal.  CT of the head normal x2, MRI of the brain unremarkable.  EEG without epileptiform discharges.  Neurology had no additional recommendations.  Evaluation in the hospital included abnormal liver enzymes and an ultrasound showing cirrhosis and moderate ascites.  He had a paracentesis with 1.8 L removed.  SAAG <0.5. Testing was negative for SBP.  Thrombocytopenia present on admission had normalized prior to discharge.  Most recent labs from 12/25/20 show platelets 234, hemoglobin 9.9, RDW 14.5, alb 2.0, AST 58, ALT 21, alk phos 71, TP 6.3, crt 0.61, Na 138 and an E coli UTI  He receives routine medical care through the [facility]. No prior knowledge of liver disease.  He will drink a beer occasionally. Denies history of heavy drinking or binge drinking. No history of problems with alcohol. He is not currently driving. Multiple DUIs - last 3 years.   No prior blood donation.  No prior blood transfusion.  No history of jaundice or scleral icterus.  No history of lower extremity edema. No history of use or experimentation with IV or intranasal street drugs.  Distant MJ use many years ago. No history of autoimmune disease.  No family history of liver disease.   No family history of GI malignancies.  No prior colonoscopy or colon cancer screening. He remembers an upper endoscopy but he can't remember when or why.   He is currently short of breath requiring oxygen supplementation that he attributes to COPD.  He had the seasonal flu vaccine and Covid vaccines. He has not had the Pneumovax or vaccination for HAV or HBV.  Past Medical History:  Diagnosis Date   COPD (chronic obstructive pulmonary disease) ([place])     No past surgical history on file.  Current Outpatient Medications  Medication Sig Dispense Refill   albuterol (PROVENTIL) (2.5 MG/3ML) 0.083%  nebulizer solution Inhale 3 mLs into the lungs every 6 (six) hours as needed for wheezing or shortness of breath.     folic acid (FOLVITE) 1 MG tablet Take 1 tablet (1 mg total) by mouth daily.     Magnesium Oxide (MAG-OXIDE) 200 MG TABS Take 2 tablets (400 mg total) by mouth 2 (two) times daily. Can give any magnesium supplement.  0   Multiple Vitamin (MULTIVITAMIN WITH MINERALS) TABS tablet Take 1 tablet by mouth daily.     Nutritional Supplements (FEEDING SUPPLEMENT, NEPRO CARB STEADY,) LIQD Take 237 mLs by mouth 3 (three) times daily between meals.  0   pantoprazole (PROTONIX) 40 MG tablet Take 1 tablet (40 mg total) by mouth daily.     thiamine 100 MG tablet Take 1 tablet (100 mg total) by mouth daily.     No current facility-administered medications for this visit.    Allergies as of 01/11/2021 - Review Complete 12/30/2020  Allergen Reaction Noted   Trazodone Anxiety 07/12/2010    No family history on file.   Review of Systems: 12 system ROS is negative except as noted above with shortness of breath requiring oxygen.   Physical Exam: General:   Alert, in NAD. No scleral icterus. No bilateral temporal wasting. 2L [facility] oxygen present.  Heart:  Regular rate and rhythm; no murmurs Pulm: Clear anteriorly; no wheezing Abdomen:  Soft. Nontender. Nondistended. Normal bowel sounds. No rebound or guarding. No fluid wave.  LAD: No inguinal or umbilical LAD Extremities:  Without edema. Neurologic:  Alert and  oriented x4;  grossly normal neurologically; no asterixis or clonus. Skin: No jaundice. No palmar erythema or spider angioma. Terry's nails present. Tattoos present.  Psych:  Alert and cooperative. Normal mood and affect.     Aralynn Brake L. Tarri Glenn, MD, MPH 01/11/2021, 2:19 PM

## 2021-01-11 NOTE — Progress Notes (Signed)
Referring Provider: [facility] [place] Physician:  Hal Morales, DO  Reason for Consultation: Alcoholic cirrhosis   IMPRESSION:  Suspected cirrhosis by ultrasound Ascites s/p paracentesis of 1.8 L 12/02/2020    - SAAG while hospital <0.5 which is not consistent with portal hypertension Normal platelets with recent thrombocytopenia while hospitalized Prolonged metabolic encephalopathy during recent hospitalization with normal ammonia level  Discordant information between ultrasound findings and SAAG and platelet counts. No stigmata of chronic liver disease on exam. No prior abdominal imaging for comparison.   PLAN: - 2000 mg sodium restricted diet given the history of ascites - Abstain from all alcohol including beer, wine, non-alcohol beer or liquor  - Labs today including: Hepatitis C antibody, hepatitis B surface antigen, hepatitis B core antibody, fasting ferritin, fasting insulin, ANA, AMA, anti-smooth muscle antibody, IgG, IgM  - Na, creatinine, hepatic function panel, AFP, PT/INR - MRI of the liver (will include elastography if that is available) - Consider ultrasound elastography if not available by MRI - Follow-up in this office in 3-4 weeks, earlier needed - Will need to consider screening colonoscopy +/- EGD for esophageal varices screening if appropriate on review of labs and imaging studies   HPI: Dustin Vazquez is a 65 y.o. male referred by medical team at [facility] for alcohol cirrhosis.  The history is obtained through the patient and review of his electronic health record. An aide from [facility] accompanies him today and contributes to his history.   Hospitalized at [facility] 11/15/2020 through 12/07/2020 for sepsis with septic shock due to Pseudomonas bacteremia urinary tract infection, new onset atrial fibrillation, and prolonged acute metabolic encephalopathy.  He has a long history of alcoholism  and was thought to have a component of Warnicke's.  He was treated with high-dose thiamine.  Ammonia level was normal.  TSH normal.  CT of the head normal x2, MRI of the brain unremarkable.  EEG without epileptiform discharges.  Neurology had no additional recommendations.  Evaluation in the hospital included abnormal liver enzymes and an ultrasound showing cirrhosis and moderate ascites.  He had a paracentesis with 1.8 L removed.  SAAG <0.5. Testing was negative for SBP.  Thrombocytopenia present on admission had normalized prior to discharge.  Most recent labs from 12/25/20 show platelets 234, hemoglobin 9.9, RDW 14.5, alb 2.0, AST 58, ALT 21, alk phos 71, TP 6.3, crt 0.61, Na 138 and an E coli UTI  He receives routine medical care through the [facility]. No prior knowledge of liver disease.  He will drink a beer occasionally. Denies history of heavy drinking or binge drinking. No history of problems with alcohol. He is not currently driving. Multiple DUIs - last 3 years.   No prior blood donation.  No prior blood transfusion.  No history of jaundice or scleral icterus.  No history of lower extremity edema. No history of use or experimentation with IV or intranasal street drugs.  Distant MJ use many years ago. No history of autoimmune disease.  No family history of liver disease.   No family history of GI malignancies.  No prior colonoscopy or colon cancer screening. He remembers an upper endoscopy but he can't remember when or why.   He is currently short of breath requiring oxygen supplementation that he attributes to COPD.  He had the seasonal flu vaccine and Covid vaccines. He has not had the Pneumovax or vaccination for HAV or HBV.  Past Medical History:  Diagnosis Date   COPD (chronic obstructive pulmonary disease) (HCC)     Past Surgical History:  Procedure Laterality Date   KIDNEY SURGERY      Current Outpatient Medications  Medication Sig Dispense Refill    albuterol (PROVENTIL) (2.5 MG/3ML) 0.083% nebulizer solution Inhale 3 mLs into the lungs every 6 (six) hours as needed for wheezing or shortness of breath.     amoxicillin (AMOXIL) 500 MG capsule Take 500 mg by mouth 3 (three) times daily.     apixaban (ELIQUIS) 5 MG TABS tablet Take 5 mg by mouth 2 (two) times daily.     budesonide-formoterol (SYMBICORT) 160-4.5 MCG/ACT inhaler Inhale 2 puffs into the lungs 2 (two) times daily.     docusate sodium (COLACE) 100 MG capsule Take 100 mg by mouth 2 (two) times daily.     FLUTICASONE FUROATE NA Place into the nose.     folic acid (FOLVITE) 1 MG tablet Take 1 tablet (1 mg total) by mouth daily.     Magnesium Oxide (MAG-OXIDE) 200 MG TABS Take 2 tablets (400 mg total) by mouth 2 (two) times daily. Can give any magnesium supplement.  0   Multiple Vitamin (MULTIVITAMIN WITH MINERALS) TABS tablet Take 1 tablet by mouth daily.     Nutritional Supplements (FEEDING SUPPLEMENT, NEPRO CARB STEADY,) LIQD Take 237 mLs by mouth 3 (three) times daily between meals.  0   pantoprazole (PROTONIX) 40 MG tablet Take 1 tablet (40 mg total) by mouth daily.     tamsulosin (FLOMAX) 0.4 MG CAPS capsule Take 0.4 mg by mouth.     thiamine 100 MG tablet Take 1 tablet (100 mg total) by mouth daily.     No current facility-administered medications for this visit.    Allergies as of 01/11/2021 - Review Complete 01/11/2021  Allergen Reaction Noted   Trazodone Anxiety 07/12/2010    Family History  Problem Relation Age of Onset   Colon cancer Neg Hx      Review of Systems: 12 system ROS is negative except as noted above with shortness of breath requiring oxygen.   Physical Exam: General:   Alert, in NAD. No scleral icterus. No bilateral temporal wasting. 2L [place] oxygen present.  Heart:  Regular rate and rhythm; no murmurs Pulm: Clear anteriorly; no wheezing Abdomen:  Soft. Nontender. Nondistended. Normal bowel sounds. No rebound or guarding. No fluid wave.  LAD: No  inguinal or umbilical LAD Extremities:  Without edema. Neurologic:  Alert and  oriented x4;  grossly normal neurologically; no asterixis or clonus. Skin: No jaundice. No palmar erythema or spider angioma. Terry's nails present. Tattoos present.  Psych:  Alert and cooperative. Normal mood and affect.     Kimberly L. Tarri Glenn, MD, MPH 01/11/2021, 3:21 PM

## 2021-01-13 ENCOUNTER — Other Ambulatory Visit: Payer: Self-pay

## 2021-01-13 ENCOUNTER — Encounter: Payer: Self-pay | Admitting: Urology

## 2021-01-13 ENCOUNTER — Ambulatory Visit (INDEPENDENT_AMBULATORY_CARE_PROVIDER_SITE_OTHER): Payer: Medicare Other | Admitting: Urology

## 2021-01-13 VITALS — BP 81/59 | HR 150 | Temp 98.9°F

## 2021-01-13 DIAGNOSIS — Z87898 Personal history of other specified conditions: Secondary | ICD-10-CM | POA: Diagnosis not present

## 2021-01-13 DIAGNOSIS — Z87440 Personal history of urinary (tract) infections: Secondary | ICD-10-CM | POA: Insufficient documentation

## 2021-01-13 DIAGNOSIS — N401 Enlarged prostate with lower urinary tract symptoms: Secondary | ICD-10-CM | POA: Diagnosis not present

## 2021-01-13 DIAGNOSIS — R339 Retention of urine, unspecified: Secondary | ICD-10-CM | POA: Diagnosis not present

## 2021-01-13 DIAGNOSIS — K7030 Alcoholic cirrhosis of liver without ascites: Secondary | ICD-10-CM

## 2021-01-13 DIAGNOSIS — D696 Thrombocytopenia, unspecified: Secondary | ICD-10-CM

## 2021-01-13 DIAGNOSIS — N138 Other obstructive and reflux uropathy: Secondary | ICD-10-CM

## 2021-01-13 LAB — BLADDER SCAN AMB NON-IMAGING: Scan Result: 249

## 2021-01-13 LAB — IRON, TOTAL/TOTAL IRON BINDING CAP
%SAT: 14 % (calc) — ABNORMAL LOW (ref 20–48)
Iron: 33 ug/dL — ABNORMAL LOW (ref 50–180)
TIBC: 244 mcg/dL (calc) — ABNORMAL LOW (ref 250–425)

## 2021-01-13 LAB — HEPATITIS B SURFACE ANTIGEN: Hepatitis B Surface Ag: NONREACTIVE

## 2021-01-13 LAB — HEPATITIS B CORE ANTIBODY, TOTAL: Hep B Core Total Ab: REACTIVE — AB

## 2021-01-13 LAB — MITOCHONDRIAL ANTIBODIES: Mitochondrial M2 Ab, IgG: 21.2 U — ABNORMAL HIGH (ref <=20.0)

## 2021-01-13 LAB — HEPATITIS C ANTIBODY
Hepatitis C Ab: NONREACTIVE
SIGNAL TO CUT-OFF: 0.16 (ref <1.00)

## 2021-01-13 LAB — IGM: IgM, Serum: 112 mg/dL (ref 50–300)

## 2021-01-13 LAB — HEPATITIS B SURFACE ANTIBODY,QUALITATIVE: Hep B S Ab: REACTIVE — AB

## 2021-01-13 LAB — ANTI-NUCLEAR AB-TITER (ANA TITER): ANA Titer 1: 1:80 {titer} — ABNORMAL HIGH

## 2021-01-13 LAB — ANA: Anti Nuclear Antibody (ANA): POSITIVE — AB

## 2021-01-13 LAB — IGG: IgG (Immunoglobin G), Serum: 1366 mg/dL (ref 600–1540)

## 2021-01-13 LAB — AFP TUMOR MARKER: AFP-Tumor Marker: 4.6 ng/mL (ref <6.1)

## 2021-01-13 NOTE — Progress Notes (Signed)
Line   Assessment: 1. History of urinary retention   2. History of UTI   3. BPH with obstruction/lower urinary tract symptoms     Plan: Continue tamsulosin 0.4 mg daily Return to office in 6-8 weeks   Chief Complaint:  Chief Complaint  Patient presents with   Urinary Retention    History of Present Illness:  Dustin Vazquez is a 65 y.o. year old male who is seen for further evaluation of urinary retention and history of UTI.  He was admitted to [facility] after being found on the side of the road with altered mental status in September 2022.  He was noted to be hypotensive and hypothermic and felt to be in septic shock secondary to UTI.  A Foley catheter was placed.  Urine culture grew >100 K Staph epidermidis and blood cultures were positive for Pseudomonas.Marland Kitchen  He was seen in consultation by urology at [facility] on 11/17/2020 after the Foley catheter was traumatically removed.  A coud catheter was replaced without difficulty.  He was discharged to a rehab facility with a Foley in place.  The Foley was again traumatically removed on 12/25/2020.  He was seen in the emergency room in [place].  A Foley catheter was replaced.  Urine culture at that time grew >100 K E. coli.  He was on Augmentin and fosfomycin.  The patient denied any prior history of voiding difficulty.  He does have a history of alcohol abuse and dementia. Renal ultrasound from 9/22 showed no evidence of hydronephrosis and a 5 mm nonobstructing stone in the lower pole of the right kidney.  His Foley was removed after a successful voiding trial on 12/30/2020.  He was continued on tamsulosin.  He returns today for follow-up.  He reports that he has been voiding spontaneously since the Foley catheter was removed on 12/30/2020.  No dysuria or gross hematuria.  He continues on tamsulosin.  He has completed his antibiotics.  He is currently a resident at [facility].  Portions of the above documentation were copied from a  prior visit for review purposes only.   Past Medical History:  Past Medical History:  Diagnosis Date   COPD (chronic obstructive pulmonary disease) (HCC)     Past Surgical History:  Past Surgical History:  Procedure Laterality Date   KIDNEY SURGERY      Allergies:  Allergies  Allergen Reactions   Trazodone Anxiety    Family History:  Family History  Problem Relation Age of Onset   Colon cancer Neg Hx     Social History:  Social History   Tobacco Use   Smoking status: Former    Types: Cigarettes   Smokeless tobacco: Never  Vaping Use   Vaping Use: Never used  Substance Use Topics   Alcohol use: Yes    Comment: ocasionally   Drug use: Never    ROS: Constitutional:  Negative for fever, chills, weight loss CV: Negative for chest pain, previous MI, hypertension Respiratory:  Negative for shortness of breath, wheezing, sleep apnea, frequent cough GI:  Negative for nausea, vomiting, bloody stool, GERD  Physical exam: BP (!) 81/59   Pulse (!) 150   Temp 98.9 F (37.2 C)  GENERAL APPEARANCE:  Well appearing, well developed, well nourished, NAD HEENT:  Atraumatic, normocephalic, oropharynx clear NECK:  Supple without lymphadenopathy or thyromegaly ABDOMEN:  Soft, non-tender, no masses EXTREMITIES:  Moves all extremities well, without clubbing, cyanosis, or edema NEUROLOGIC:  Alert and oriented x 3, in  wheelchair, CN II-XII grossly intact MENTAL STATUS:  appropriate BACK:  Non-tender to palpation, No CVAT SKIN:  Warm, dry, and intact  Results: No sample provided  PVR: 249 ml (patient did not void prior to study, last void > 1 hour prior)

## 2021-01-13 NOTE — Progress Notes (Signed)
Urological Symptom Review  [id number]  Patient is experiencing the following symptoms: N/a   Review of Systems  Gastrointestinal (upper)  : Negative for upper GI symptoms  Gastrointestinal (lower) : Negative for lower GI symptoms  Constitutional : Negative for symptoms  Skin: Negative for skin symptoms  Eyes: Negative for eye symptoms  Ear/Nose/Throat : Negative for Ear/Nose/Throat symptoms  Hematologic/Lymphatic: Negative for Hematologic/Lymphatic symptoms  Cardiovascular : Negative for cardiovascular symptoms  Respiratory : Negative for respiratory symptoms  Endocrine: Negative for endocrine symptoms  Musculoskeletal: Negative for musculoskeletal symptoms  Neurological: Negative for neurological symptoms  Psychologic: Negative for psychiatric symptoms

## 2021-01-17 LAB — CBC AND DIFFERENTIAL
HCT: 31 — AB (ref 41–53)
Hemoglobin: 10.3 — AB (ref 13.5–17.5)
Platelets: 245 (ref 150–399)
WBC: 6

## 2021-01-17 LAB — CBC: RBC: 3.35 — AB (ref 3.87–5.11)

## 2021-01-19 ENCOUNTER — Encounter: Payer: Self-pay | Admitting: Gastroenterology

## 2021-01-19 LAB — CBC WITH DIFFERENTIAL
BASO%: 1 %
Basophil: 0
Eosinophil %: 5.8
Eosinophil: 0.3
LYMPH%: 28 %
Lymphocytes: 1.7
MCH: 30.8
MCHC: 33.4
MCV: 92.3 (ref 76–111)
MPV: 8.5 fL (ref 7.5–11.5)
Monocyte %: 15.5
Monocytes: 0.9
Neutrophils: 3
RDW-SD: 13.6
Segs Relative: 50 % (ref 36–72)

## 2021-03-14 ENCOUNTER — Ambulatory Visit: Payer: Medicare Other | Admitting: Gastroenterology

## 2021-03-14 NOTE — Progress Notes (Deleted)
Referring Provider: Hal Morales, * Primary Care Physician:  Hal Morales, DO  Chief Complaint: Alcoholic cirrhosis   IMPRESSION:  Suspected cirrhosis due to alcohol - ? Concurrent PBC or autoimmune hepatitis    - labs 01/2021 showed a MELD of 10, platelets normal    - ongoing consumption of beer Ascites s/p paracentesis of 1.8 L 12/02/2020    - SAAG while hospital <0.5 which is not consistent with portal hypertension No prior colon cancer screening  Suspected cirrhosis: Discordant information between ultrasound findings and SAAG and platelet counts. No stigmata of chronic liver disease on exam. No prior abdominal imaging for comparison.  Will reorder MRI with elastography. Plan liver biopsy to evaluate the the etiology of cirrhosis. Plan EGD to screen for varices.   PLAN: - 2000 mg sodium restricted diet given the history of ascites - Abstain from all alcohol including beer, wine, non-alcohol beer or liquor  - Na, creatinine, hepatic function panel, AFP, PT/INR - MRI of the liver (will include elastography if that is available) - Consider ultrasound elastography if not available by MRI - EGD and colonoscopy with Dr. Tarri Glenn at the hospital - Follow-up in this office in 3-4 weeks, earlier needed   HPI: Dustin Vazquez is a 66 y.o. male returns in follow-up after his initial consultation 01/11/21. The interval history is obtained through the patient.  An aide from [facility] accompanies him today and contributes to his history. He receives routine medical care through the [facility]. No prior knowledge of liver disease.  Hospitalized at [facility] 11/15/2020 through 12/07/2020 for sepsis with septic shock due to Pseudomonas bacteremia urinary tract infection, new onset atrial fibrillation, and prolonged acute metabolic encephalopathy.  He has a long history of alcoholism and was thought to have a component of Wernicke's.  He  was treated with high-dose thiamine.  Ammonia level was normal.  TSH normal.  CT of the head normal x2, MRI of the brain unremarkable.  EEG without epileptiform discharges.  Neurology had no additional recommendations.  Evaluation in the hospital included abnormal liver enzymes and an ultrasound showing cirrhosis and moderate ascites.  He had a paracentesis with 1.8 L removed.  SAAG <0.5. Testing was negative for SBP.  Thrombocytopenia present on admission had normalized prior to discharge.  At the time of his consultation 11/22 he reported drinking beer occasionally. Denies history of heavy drinking or binge drinking. No history of problems with alcohol. He is not currently driving. Multiple DUIs - last 3 years.   Referral labs from 12/25/20 showed platelets 234, hemoglobin 9.9, RDW 14.5, alb 2.0, AST 58, ALT 21, alk phos 71, TP 6.3, crt 0.61, Na 138 and an E coli UTI  Labs from 01/11/21: Sodium 137, creatinine 0.59, IgG 1366, IgM 112, iron 33, ferritin 91.5, percent saturation 14, INR 1.4, ANA positive at 1-80, AMA 21.2, AFP 4.6, hep B surface antigen nonreactive, hep B surface antibody reactive, hep B core total antibody reactive, hep C antibody nonreactive. Labs show a calculated MELD score of 10.   Labs from 01/17/21 showed WBC 6, hgb 10.3, platelets 245, MCV 92  Given the discordant information between ultrasound findings and SAAG and platelet counts, lack of  stigmata of chronic liver disease, and lack of prior abdominal imaging for comparison, MRI was recommended for further evaluation. Unfortunately, this was not performed.    Past Medical History:  Diagnosis Date   COPD (chronic obstructive pulmonary disease) ([place])  Past Surgical History:  Procedure Laterality Date   KIDNEY SURGERY      Current Outpatient Medications  Medication Sig Dispense Refill   albuterol (PROVENTIL) (2.5 MG/3ML) 0.083% nebulizer solution Inhale 3 mLs into the lungs every 6 (six) hours as needed for wheezing  or shortness of breath.     amoxicillin (AMOXIL) 500 MG capsule Take 500 mg by mouth 3 (three) times daily.     apixaban (ELIQUIS) 5 MG TABS tablet Take 5 mg by mouth 2 (two) times daily.     budesonide-formoterol (SYMBICORT) 160-4.5 MCG/ACT inhaler Inhale 2 puffs into the lungs 2 (two) times daily.     docusate sodium (COLACE) 100 MG capsule Take 100 mg by mouth 2 (two) times daily.     FLUTICASONE FUROATE NA Place into the nose.     folic acid (FOLVITE) 1 MG tablet Take 1 tablet (1 mg total) by mouth daily.     Magnesium Oxide (MAG-OXIDE) 200 MG TABS Take 2 tablets (400 mg total) by mouth 2 (two) times daily. Can give any magnesium supplement.  0   Multiple Vitamin (MULTIVITAMIN WITH MINERALS) TABS tablet Take 1 tablet by mouth daily.     Nutritional Supplements (FEEDING SUPPLEMENT, NEPRO CARB STEADY,) LIQD Take 237 mLs by mouth 3 (three) times daily between meals.  0   pantoprazole (PROTONIX) 40 MG tablet Take 1 tablet (40 mg total) by mouth daily.     tamsulosin (FLOMAX) 0.4 MG CAPS capsule Take 0.4 mg by mouth.     thiamine 100 MG tablet Take 1 tablet (100 mg total) by mouth daily.     No current facility-administered medications for this visit.    Allergies as of 03/14/2021 - Review Complete 01/13/2021  Allergen Reaction Noted   Trazodone Anxiety 07/12/2010    Physical Exam: General:   Alert, in NAD. No scleral icterus. No bilateral temporal wasting. 2L [place] oxygen present.  Heart:  Regular rate and rhythm; no murmurs Pulm: Clear anteriorly; no wheezing Abdomen:  Soft. Nontender. Nondistended. Normal bowel sounds. No rebound or guarding. No fluid wave.  LAD: No inguinal or umbilical LAD Extremities:  Without edema. Neurologic:  Alert and  oriented x4;  grossly normal neurologically; no asterixis or clonus. Skin: No jaundice. No palmar erythema or spider angioma. Terry's nails present. Tattoos present.  Psych:  Alert and cooperative. Normal mood and affect.     Demonte Dobratz L.  Tarri Glenn, MD, MPH 03/14/2021, 1:44 PM

## 2021-03-16 ENCOUNTER — Ambulatory Visit: Payer: Medicare Other | Admitting: Urology

## 2021-03-16 DIAGNOSIS — N138 Other obstructive and reflux uropathy: Secondary | ICD-10-CM

## 2021-03-16 DIAGNOSIS — Z87440 Personal history of urinary (tract) infections: Secondary | ICD-10-CM

## 2021-03-16 NOTE — Progress Notes (Deleted)
Line   Assessment: 1. BPH with obstruction/lower urinary tract symptoms   2. History of UTI   3. History of urinary retention     Plan: Continue tamsulosin 0.4 mg daily Return to office in 6-8 weeks  Chief Complaint:  No chief complaint on file.   History of Present Illness:  Dustin Vazquez is a 66 y.o. year old male who is seen for further evaluation of urinary retention and history of UTI.  He was admitted to [facility] after being found on the side of the road with altered mental status in September 2022.  He was noted to be hypotensive and hypothermic and felt to be in septic shock secondary to UTI.  A Foley catheter was placed.  Urine culture grew >100 K Staph epidermidis and blood cultures were positive for Pseudomonas.Marland Kitchen  He was seen in consultation by urology at [facility] on 11/17/2020 after the Foley catheter was traumatically removed.  A coud catheter was replaced without difficulty.  He was discharged to a rehab facility with a Foley in place.  The Foley was again traumatically removed on 12/25/2020.  He was seen in the emergency room in [place].  A Foley catheter was replaced.  Urine culture at that time grew >100 K E. coli.  He was on Augmentin and fosfomycin.  The patient denied any prior history of voiding difficulty.  He does have a history of alcohol abuse and dementia. Renal ultrasound from 9/22 showed no evidence of hydronephrosis and a 5 mm nonobstructing stone in the lower pole of the right kidney.  His Foley was removed after a successful voiding trial on 12/30/2020.  He was continued on tamsulosin.  At his visit in November 2022, he reported that he had been voiding spontaneously since the Foley catheter was removed on 12/30/2020.  No dysuria or gross hematuria.  He continued on tamsulosin.  He had completed his antibiotics.  He is currently a resident at [facility].  Portions of the above documentation were copied from a prior visit for review purposes  only.   Past Medical History:  Past Medical History:  Diagnosis Date   COPD (chronic obstructive pulmonary disease) ([place])     Past Surgical History:  Past Surgical History:  Procedure Laterality Date   KIDNEY SURGERY      Allergies:  Allergies  Allergen Reactions   Trazodone Anxiety    Family History:  Family History  Problem Relation Age of Onset   Colon cancer Neg Hx     Social History:  Social History   Tobacco Use   Smoking status: Former    Types: Cigarettes   Smokeless tobacco: Never  Vaping Use   Vaping Use: Never used  Substance Use Topics   Alcohol use: Yes    Comment: ocasionally   Drug use: Never    ROS: Constitutional:  Negative for fever, chills, weight loss CV: Negative for chest pain, previous MI, hypertension Respiratory:  Negative for shortness of breath, wheezing, sleep apnea, frequent cough GI:  Negative for nausea, vomiting, bloody stool, GERD  Physical exam: There were no vitals taken for this visit. ***  Results: ***

## 2022-12-04 IMAGING — US US PARACENTESIS
1 series · 5 of 5 positions shown · non-contrast
Comparison: none

INDICATION: Ascites secondary to alcoholic cirrhosis. Request for diagnostic and
therapeutic paracentesis.

[Series 1: us paracentesis · 0.24mm/px · 5 of 5 slices shown]
[im 1/5]
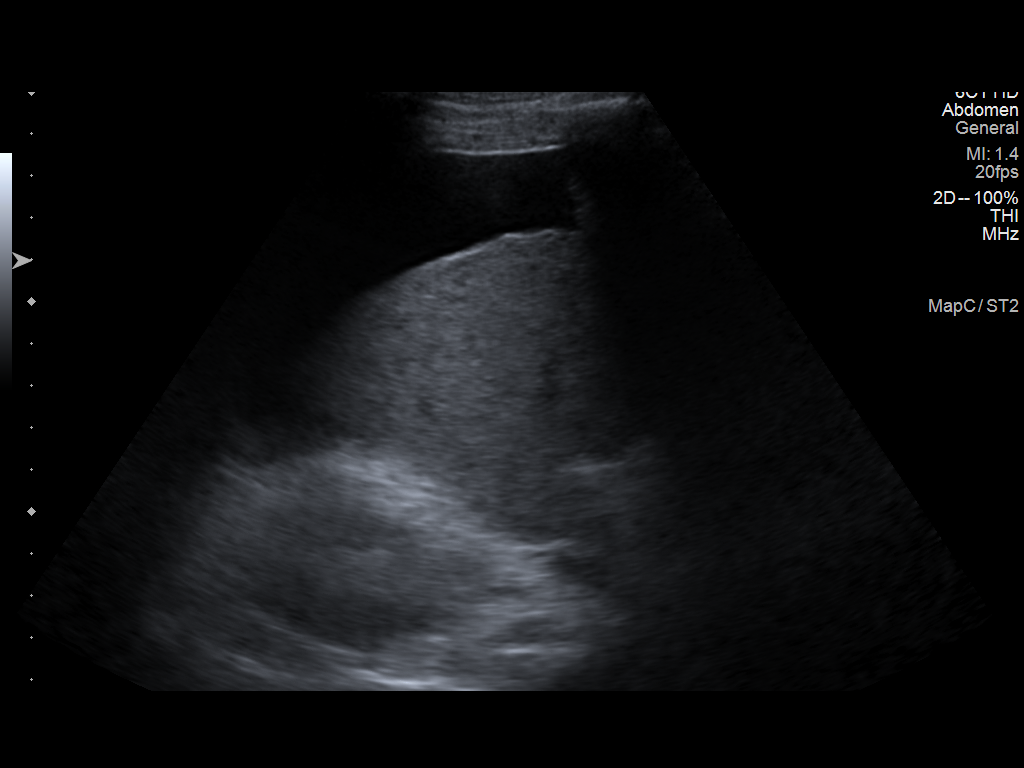
[im 2/5]
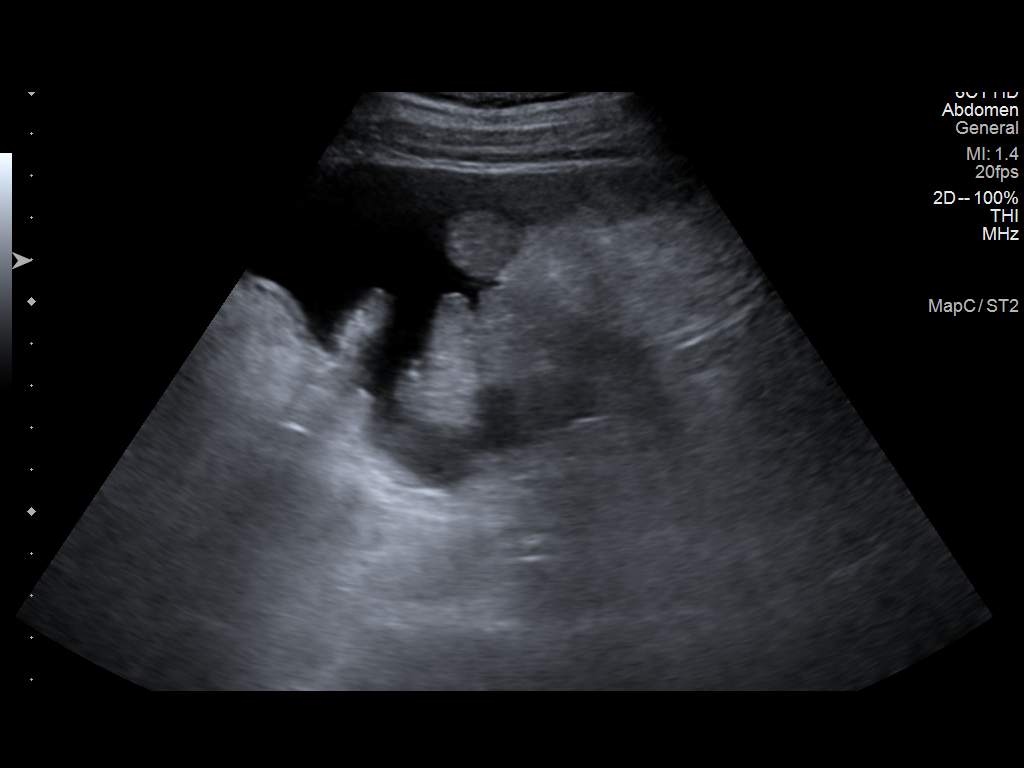
[im 3/5]
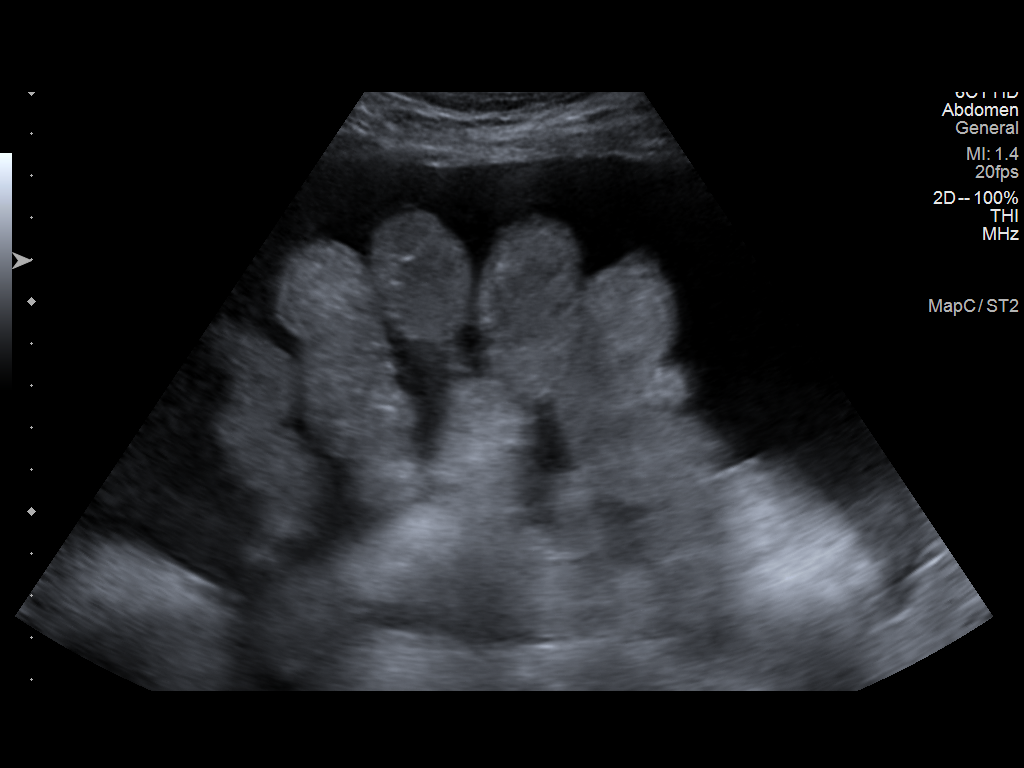
[im 4/5]
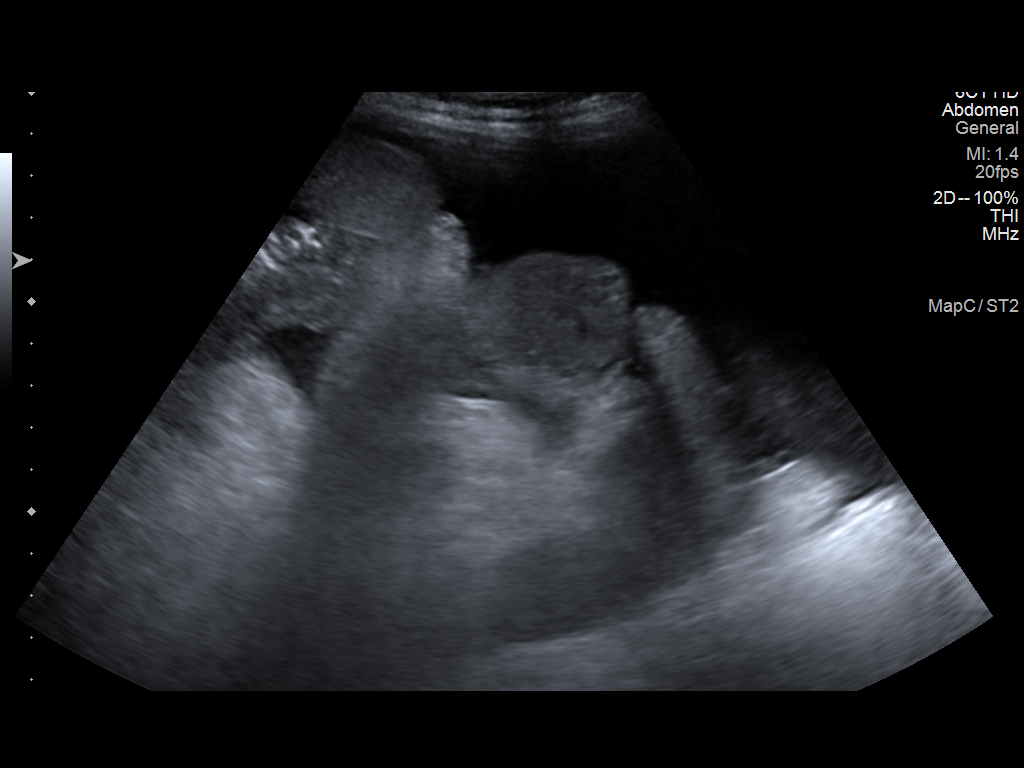
[im 5/5]
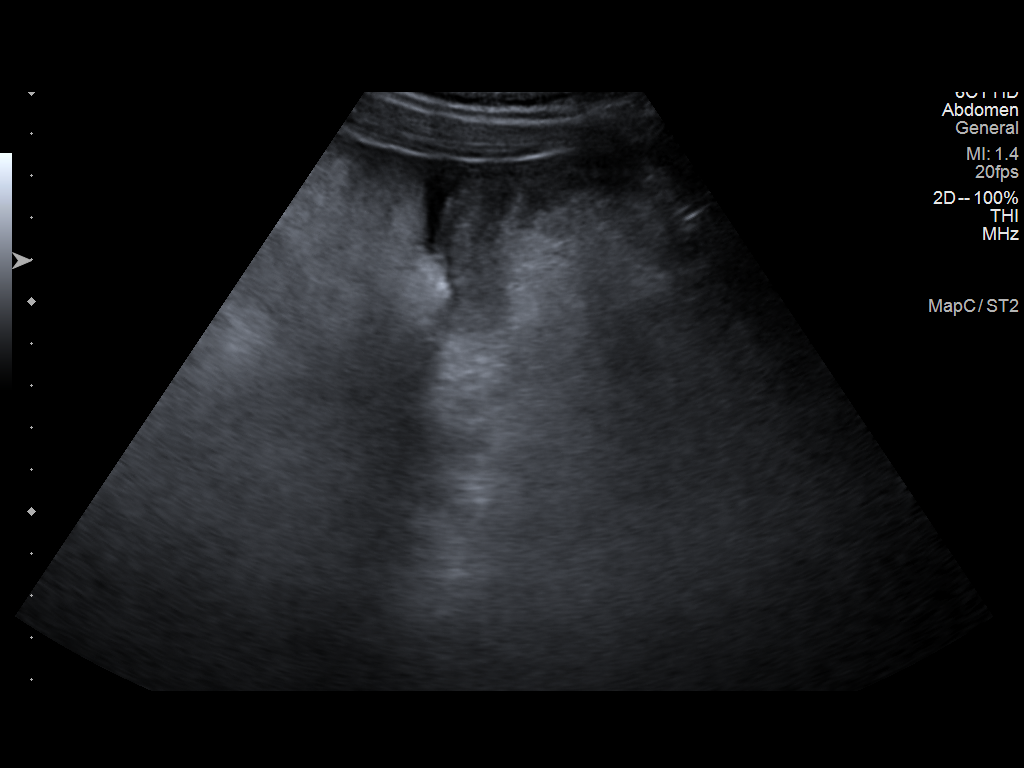

[5 of 5 positions shown; findings below may reference images not displayed]

EXAM:
ULTRASOUND GUIDED PARACENTESIS

MEDICATIONS:
1% lidocaine 10 mL

COMPLICATIONS:
None immediate.

PROCEDURE:
Informed written consent was obtained from the patient after a
discussion of the risks, benefits and alternatives to treatment. A
timeout was performed prior to the initiation of the procedure.

Initial ultrasound scanning demonstrates a large amount of ascites
within the left lateral abdomen. The left lateral abdomen was
prepped and draped in the usual sterile fashion. 1% lidocaine was
used for local anesthesia.

Following this, a 6 Fr Safe-T-Centesis catheter was introduced. An
ultrasound image was saved for documentation purposes. The
paracentesis was performed. The catheter was removed and a dressing
was applied. The patient tolerated the procedure well without
immediate post procedural complication.
FINDINGS: A total of approximately 1.8 L of clear yellow fluid was removed.
Samples were sent to the laboratory as requested by the clinical
team.
IMPRESSION: Successful ultrasound-guided paracentesis yielding 1.8 liters of
peritoneal fluid.
